# Patient Record
Sex: Female | Born: 1950
Health system: Southern US, Community
[De-identification: ages and names within clinical notes are randomized; demographics above are authoritative.]

## PROBLEM LIST (undated history)

## (undated) DIAGNOSIS — K219 Gastro-esophageal reflux disease without esophagitis: Secondary | ICD-10-CM

## (undated) DIAGNOSIS — M8580 Other specified disorders of bone density and structure, unspecified site: Secondary | ICD-10-CM

## (undated) DIAGNOSIS — I10 Essential (primary) hypertension: Secondary | ICD-10-CM

## (undated) DIAGNOSIS — E785 Hyperlipidemia, unspecified: Secondary | ICD-10-CM

## (undated) DIAGNOSIS — N841 Polyp of cervix uteri: Secondary | ICD-10-CM

## (undated) DIAGNOSIS — M1710 Unilateral primary osteoarthritis, unspecified knee: Secondary | ICD-10-CM

## (undated) HISTORY — DX: Gastro-esophageal reflux disease without esophagitis: K21.9

## (undated) HISTORY — PX: BREAST CYST ASPIRATION: SHX578

## (undated) HISTORY — DX: Unilateral primary osteoarthritis, unspecified knee: M17.10

## (undated) HISTORY — DX: Other specified disorders of bone density and structure, unspecified site: M85.80

## (undated) HISTORY — DX: Hyperlipidemia, unspecified: E78.5

## (undated) HISTORY — DX: Essential (primary) hypertension: I10

## (undated) HISTORY — DX: Polyp of cervix uteri: N84.1

## (undated) HISTORY — PX: APPENDECTOMY: SHX54

---

## 1986-06-23 ENCOUNTER — Encounter: Payer: Self-pay | Admitting: Gastroenterology

## 1998-06-02 ENCOUNTER — Other Ambulatory Visit: Admission: RE | Admit: 1998-06-02 | Discharge: 1998-06-02 | Payer: Self-pay | Admitting: Obstetrics and Gynecology

## 1998-06-22 ENCOUNTER — Ambulatory Visit (HOSPITAL_COMMUNITY): Admission: RE | Admit: 1998-06-22 | Discharge: 1998-06-22 | Payer: Self-pay | Admitting: Obstetrics and Gynecology

## 1998-06-22 ENCOUNTER — Encounter: Payer: Self-pay | Admitting: Obstetrics and Gynecology

## 1999-06-02 ENCOUNTER — Other Ambulatory Visit: Admission: RE | Admit: 1999-06-02 | Discharge: 1999-06-02 | Payer: Self-pay | Admitting: Obstetrics and Gynecology

## 1999-06-15 ENCOUNTER — Ambulatory Visit (HOSPITAL_COMMUNITY): Admission: RE | Admit: 1999-06-15 | Discharge: 1999-06-15 | Payer: Self-pay | Admitting: Obstetrics and Gynecology

## 1999-06-15 ENCOUNTER — Encounter: Payer: Self-pay | Admitting: Obstetrics and Gynecology

## 2000-07-22 ENCOUNTER — Ambulatory Visit (HOSPITAL_COMMUNITY): Admission: RE | Admit: 2000-07-22 | Discharge: 2000-07-22 | Payer: Self-pay | Admitting: Obstetrics and Gynecology

## 2000-07-22 ENCOUNTER — Encounter: Payer: Self-pay | Admitting: Obstetrics and Gynecology

## 2000-08-21 ENCOUNTER — Other Ambulatory Visit: Admission: RE | Admit: 2000-08-21 | Discharge: 2000-08-21 | Payer: Self-pay | Admitting: Obstetrics and Gynecology

## 2001-09-08 ENCOUNTER — Other Ambulatory Visit: Admission: RE | Admit: 2001-09-08 | Discharge: 2001-09-08 | Payer: Self-pay | Admitting: Obstetrics and Gynecology

## 2001-09-15 ENCOUNTER — Encounter: Payer: Self-pay | Admitting: Obstetrics and Gynecology

## 2001-09-15 ENCOUNTER — Ambulatory Visit (HOSPITAL_COMMUNITY): Admission: RE | Admit: 2001-09-15 | Discharge: 2001-09-15 | Payer: Self-pay | Admitting: Obstetrics and Gynecology

## 2002-10-08 ENCOUNTER — Encounter: Payer: Self-pay | Admitting: Obstetrics and Gynecology

## 2002-10-08 ENCOUNTER — Ambulatory Visit (HOSPITAL_COMMUNITY): Admission: RE | Admit: 2002-10-08 | Discharge: 2002-10-08 | Payer: Self-pay | Admitting: Obstetrics and Gynecology

## 2002-10-30 ENCOUNTER — Other Ambulatory Visit: Admission: RE | Admit: 2002-10-30 | Discharge: 2002-10-30 | Payer: Self-pay | Admitting: *Deleted

## 2003-11-30 ENCOUNTER — Ambulatory Visit (HOSPITAL_COMMUNITY): Admission: RE | Admit: 2003-11-30 | Discharge: 2003-11-30 | Payer: Self-pay | Admitting: Family Medicine

## 2003-12-14 ENCOUNTER — Other Ambulatory Visit: Admission: RE | Admit: 2003-12-14 | Discharge: 2003-12-14 | Payer: Self-pay | Admitting: Obstetrics and Gynecology

## 2003-12-30 ENCOUNTER — Ambulatory Visit: Payer: Self-pay | Admitting: Family Medicine

## 2004-01-11 ENCOUNTER — Ambulatory Visit: Payer: Self-pay | Admitting: Family Medicine

## 2004-01-18 ENCOUNTER — Ambulatory Visit: Payer: Self-pay | Admitting: Family Medicine

## 2004-11-30 ENCOUNTER — Ambulatory Visit (HOSPITAL_COMMUNITY): Admission: RE | Admit: 2004-11-30 | Discharge: 2004-11-30 | Payer: Self-pay | Admitting: Obstetrics and Gynecology

## 2004-12-27 ENCOUNTER — Ambulatory Visit: Payer: Self-pay | Admitting: Family Medicine

## 2005-12-03 ENCOUNTER — Ambulatory Visit (HOSPITAL_COMMUNITY): Admission: RE | Admit: 2005-12-03 | Discharge: 2005-12-03 | Payer: Self-pay | Admitting: Family Medicine

## 2005-12-07 ENCOUNTER — Ambulatory Visit: Payer: Self-pay | Admitting: Family Medicine

## 2006-10-08 ENCOUNTER — Ambulatory Visit: Payer: Self-pay | Admitting: Family Medicine

## 2006-10-08 LAB — CONVERTED CEMR LAB
Basophils Relative: 0.6 % (ref 0.0–1.0)
Bilirubin Urine: NEGATIVE
CO2: 30 meq/L (ref 19–32)
Cholesterol: 194 mg/dL (ref 0–200)
Creatinine, Ser: 0.9 mg/dL (ref 0.4–1.2)
Glucose, Urine, Semiquant: NEGATIVE
HCT: 40.4 % (ref 36.0–46.0)
Hemoglobin: 13.6 g/dL (ref 12.0–15.0)
LDL Cholesterol: 102 mg/dL — ABNORMAL HIGH (ref 0–99)
MCHC: 33.7 g/dL (ref 30.0–36.0)
Monocytes Absolute: 0.5 10*3/uL (ref 0.2–0.7)
Neutrophils Relative %: 67.1 % (ref 43.0–77.0)
Potassium: 4.9 meq/L (ref 3.5–5.1)
RDW: 13 % (ref 11.5–14.6)
Sodium: 142 meq/L (ref 135–145)
Specific Gravity, Urine: 1.015
TSH: 1.52 microintl units/mL (ref 0.35–5.50)
Total Bilirubin: 1 mg/dL (ref 0.3–1.2)
Total Protein: 7.4 g/dL (ref 6.0–8.3)
Urobilinogen, UA: 0.2
VLDL: 15 mg/dL (ref 0–40)

## 2006-10-15 ENCOUNTER — Ambulatory Visit: Payer: Self-pay | Admitting: Family Medicine

## 2006-10-15 DIAGNOSIS — L259 Unspecified contact dermatitis, unspecified cause: Secondary | ICD-10-CM | POA: Insufficient documentation

## 2006-11-07 ENCOUNTER — Ambulatory Visit: Payer: Self-pay | Admitting: Gastroenterology

## 2006-11-19 ENCOUNTER — Encounter: Payer: Self-pay | Admitting: Family Medicine

## 2006-11-19 ENCOUNTER — Ambulatory Visit: Payer: Self-pay | Admitting: Gastroenterology

## 2006-11-26 ENCOUNTER — Ambulatory Visit: Payer: Self-pay | Admitting: Family Medicine

## 2006-12-14 ENCOUNTER — Encounter: Payer: Self-pay | Admitting: Family Medicine

## 2006-12-14 ENCOUNTER — Ambulatory Visit: Payer: Self-pay | Admitting: Family Medicine

## 2006-12-26 ENCOUNTER — Encounter: Admission: RE | Admit: 2006-12-26 | Discharge: 2006-12-26 | Payer: Self-pay | Admitting: Family Medicine

## 2007-11-25 ENCOUNTER — Ambulatory Visit: Payer: Self-pay | Admitting: Family Medicine

## 2007-12-25 ENCOUNTER — Ambulatory Visit: Payer: Self-pay | Admitting: Family Medicine

## 2007-12-25 LAB — CONVERTED CEMR LAB
ALT: 16 units/L (ref 0–35)
Albumin: 4 g/dL (ref 3.5–5.2)
BUN: 12 mg/dL (ref 6–23)
Basophils Absolute: 0.1 10*3/uL (ref 0.0–0.1)
Basophils Relative: 0.8 % (ref 0.0–3.0)
CO2: 30 meq/L (ref 19–32)
Calcium: 10 mg/dL (ref 8.4–10.5)
Creatinine, Ser: 0.8 mg/dL (ref 0.4–1.2)
Direct LDL: 105.2 mg/dL
Eosinophils Relative: 1.5 % (ref 0.0–5.0)
Glucose, Bld: 89 mg/dL (ref 70–99)
Glucose, Urine, Semiquant: NEGATIVE
Hemoglobin: 14.4 g/dL (ref 12.0–15.0)
Lymphocytes Relative: 22.4 % (ref 12.0–46.0)
MCHC: 33.6 g/dL (ref 30.0–36.0)
MCV: 88.5 fL (ref 78.0–100.0)
Neutro Abs: 4.8 10*3/uL (ref 1.4–7.7)
RBC: 4.84 M/uL (ref 3.87–5.11)
Specific Gravity, Urine: 1.01
TSH: 1.15 microintl units/mL (ref 0.35–5.50)
Total Bilirubin: 0.9 mg/dL (ref 0.3–1.2)
Total Protein: 7.8 g/dL (ref 6.0–8.3)
VLDL: 18 mg/dL (ref 0–40)
WBC Urine, dipstick: NEGATIVE
pH: 7

## 2007-12-31 ENCOUNTER — Encounter: Admission: RE | Admit: 2007-12-31 | Discharge: 2007-12-31 | Payer: Self-pay | Admitting: Family Medicine

## 2008-01-01 ENCOUNTER — Ambulatory Visit: Payer: Self-pay | Admitting: Family Medicine

## 2008-07-08 ENCOUNTER — Ambulatory Visit: Payer: Self-pay | Admitting: Family Medicine

## 2008-07-15 ENCOUNTER — Ambulatory Visit: Payer: Self-pay | Admitting: Family Medicine

## 2008-10-15 ENCOUNTER — Encounter: Admission: RE | Admit: 2008-10-15 | Discharge: 2008-10-15 | Payer: Self-pay | Admitting: Orthopaedic Surgery

## 2008-11-17 ENCOUNTER — Ambulatory Visit: Payer: Self-pay | Admitting: Family Medicine

## 2008-12-28 ENCOUNTER — Ambulatory Visit: Payer: Self-pay | Admitting: Family Medicine

## 2008-12-28 LAB — CONVERTED CEMR LAB
AST: 21 units/L (ref 0–37)
Albumin: 3.8 g/dL (ref 3.5–5.2)
Alkaline Phosphatase: 72 units/L (ref 39–117)
Basophils Absolute: 0 10*3/uL (ref 0.0–0.1)
Basophils Relative: 0.6 % (ref 0.0–3.0)
CO2: 27 meq/L (ref 19–32)
Eosinophils Absolute: 0 10*3/uL (ref 0.0–0.7)
Glucose, Bld: 82 mg/dL (ref 70–99)
HCT: 40.4 % (ref 36.0–46.0)
Hemoglobin: 13.7 g/dL (ref 12.0–15.0)
Lymphs Abs: 1.5 10*3/uL (ref 0.7–4.0)
MCHC: 33.9 g/dL (ref 30.0–36.0)
Monocytes Relative: 6 % (ref 3.0–12.0)
Neutro Abs: 5.4 10*3/uL (ref 1.4–7.7)
Nitrite: NEGATIVE
Potassium: 3.8 meq/L (ref 3.5–5.1)
RBC: 4.45 M/uL (ref 3.87–5.11)
RDW: 13.3 % (ref 11.5–14.6)
Sodium: 140 meq/L (ref 135–145)
Specific Gravity, Urine: <1.005
TSH: 1.17 microintl units/mL (ref 0.35–5.50)
Total CHOL/HDL Ratio: 3
Total Protein: 7.4 g/dL (ref 6.0–8.3)
Urobilinogen, UA: 0.2

## 2009-01-03 ENCOUNTER — Encounter: Admission: RE | Admit: 2009-01-03 | Discharge: 2009-01-03 | Payer: Self-pay | Admitting: Family Medicine

## 2009-01-04 ENCOUNTER — Other Ambulatory Visit: Admission: RE | Admit: 2009-01-04 | Discharge: 2009-01-04 | Payer: Self-pay | Admitting: Family Medicine

## 2009-01-04 ENCOUNTER — Ambulatory Visit: Payer: Self-pay | Admitting: Family Medicine

## 2009-01-04 DIAGNOSIS — R109 Unspecified abdominal pain: Secondary | ICD-10-CM | POA: Insufficient documentation

## 2009-01-05 ENCOUNTER — Encounter: Admission: RE | Admit: 2009-01-05 | Discharge: 2009-01-05 | Payer: Self-pay | Admitting: Family Medicine

## 2009-01-12 ENCOUNTER — Telehealth: Payer: Self-pay | Admitting: Family Medicine

## 2009-01-12 ENCOUNTER — Telehealth: Payer: Self-pay | Admitting: Gastroenterology

## 2009-01-12 ENCOUNTER — Encounter: Admission: RE | Admit: 2009-01-12 | Discharge: 2009-01-12 | Payer: Self-pay | Admitting: Family Medicine

## 2009-01-13 ENCOUNTER — Encounter (INDEPENDENT_AMBULATORY_CARE_PROVIDER_SITE_OTHER): Payer: Self-pay | Admitting: *Deleted

## 2009-01-17 ENCOUNTER — Ambulatory Visit: Payer: Self-pay | Admitting: Gastroenterology

## 2009-04-01 ENCOUNTER — Ambulatory Visit: Payer: Self-pay | Admitting: Family Medicine

## 2009-04-01 DIAGNOSIS — R059 Cough, unspecified: Secondary | ICD-10-CM | POA: Insufficient documentation

## 2009-04-01 DIAGNOSIS — R05 Cough: Secondary | ICD-10-CM

## 2009-04-28 ENCOUNTER — Ambulatory Visit: Payer: Self-pay | Admitting: Family Medicine

## 2009-04-28 DIAGNOSIS — J45909 Unspecified asthma, uncomplicated: Secondary | ICD-10-CM | POA: Insufficient documentation

## 2009-05-02 ENCOUNTER — Ambulatory Visit: Payer: Self-pay | Admitting: Family Medicine

## 2009-05-19 ENCOUNTER — Ambulatory Visit: Payer: Self-pay | Admitting: Family Medicine

## 2009-11-02 ENCOUNTER — Ambulatory Visit: Payer: Self-pay | Admitting: Family Medicine

## 2010-01-02 ENCOUNTER — Ambulatory Visit: Payer: Self-pay | Admitting: Family Medicine

## 2010-01-02 LAB — CONVERTED CEMR LAB
ALT: 32 units/L (ref 0–35)
Albumin: 3.9 g/dL (ref 3.5–5.2)
Basophils Relative: 0.4 % (ref 0.0–3.0)
Bilirubin Urine: NEGATIVE
Bilirubin, Direct: 0.1 mg/dL (ref 0.0–0.3)
CO2: 28 meq/L (ref 19–32)
Chloride: 104 meq/L (ref 96–112)
Cholesterol: 216 mg/dL — ABNORMAL HIGH (ref 0–200)
Creatinine, Ser: 0.9 mg/dL (ref 0.4–1.2)
Direct LDL: 117.8 mg/dL
Eosinophils Absolute: 0.1 10*3/uL (ref 0.0–0.7)
Eosinophils Relative: 1.3 % (ref 0.0–5.0)
HCT: 39.4 % (ref 36.0–46.0)
Hemoglobin: 13.4 g/dL (ref 12.0–15.0)
MCHC: 34.1 g/dL (ref 30.0–36.0)
MCV: 89.9 fL (ref 78.0–100.0)
Monocytes Absolute: 0.6 10*3/uL (ref 0.1–1.0)
Neutro Abs: 5.8 10*3/uL (ref 1.4–7.7)
Neutrophils Relative %: 66.3 % (ref 43.0–77.0)
Potassium: 4.3 meq/L (ref 3.5–5.1)
Protein, U semiquant: NEGATIVE
RBC: 4.38 M/uL (ref 3.87–5.11)
Sodium: 141 meq/L (ref 135–145)
Total CHOL/HDL Ratio: 3
Total Protein: 6.7 g/dL (ref 6.0–8.3)
Urobilinogen, UA: 0.2
VLDL: 21.8 mg/dL (ref 0.0–40.0)
WBC Urine, dipstick: NEGATIVE
WBC: 8.7 10*3/uL (ref 4.5–10.5)

## 2010-01-04 ENCOUNTER — Encounter: Admission: RE | Admit: 2010-01-04 | Discharge: 2010-01-04 | Payer: Self-pay | Admitting: Family Medicine

## 2010-01-04 LAB — HM MAMMOGRAPHY

## 2010-01-09 ENCOUNTER — Other Ambulatory Visit: Admission: RE | Admit: 2010-01-09 | Discharge: 2010-01-09 | Payer: Self-pay | Admitting: Family Medicine

## 2010-01-09 ENCOUNTER — Encounter: Payer: Self-pay | Admitting: Family Medicine

## 2010-01-09 ENCOUNTER — Ambulatory Visit: Payer: Self-pay | Admitting: Family Medicine

## 2010-01-09 LAB — HM PAP SMEAR

## 2010-03-16 NOTE — Assessment & Plan Note (Signed)
Summary: 3 wk rov/njr   Vital Signs:  Patient profile:   60 year old female Menstrual status:  postmenopausal Temp:     98.6 degrees F oral BP sitting:   140 / 96  (left arm) Cuff size:   regular  Vitals Entered By: Kern Reap CMA Duncan Dull) (May 19, 2009 4:49 PM) CC: follow-up visit   Primary Care Provider:  Tinnie Gens Dominion Kathan,MD  CC:  follow-up visit.  History of Present Illness: Colleen Coffey is a 60 year old female, who comes back today for follow-up of asthma.  She is been on one half tablet of prednisone daily x 7 days and her cough is pretty much gone.  No side effects from medication  Allergies: No Known Drug Allergies  Past History:  Past medical, surgical, family and social histories (including risk factors) reviewed for relevance to current acute and chronic problems.  Past Medical History: Reviewed history from 01/17/2009 and no changes required. Hemorrhoids Hx of constipation  Past Surgical History: Reviewed history from 01/01/2008 and no changes required. Childbirth x 2  C-sections BTL  Family History: Reviewed history from 01/17/2009 and no changes required. Family History High cholesterol Family History Hypertension Fam hx COPD No FH of Colon Cancer:  Social History: Reviewed history from 01/17/2009 and no changes required. Former Smoker Alcohol use-no Married Illicit Drug Use - no Daily Caffeine Use  rarely  Review of Systems      See HPI  Physical Exam  General:  Well-developed,well-nourished,in no acute distress; alert,appropriate and cooperative throughout examination Head:  Normocephalic and atraumatic without obvious abnormalities. No apparent alopecia or balding. Eyes:  No corneal or conjunctival inflammation noted. EOMI. Perrla. Funduscopic exam benign, without hemorrhages, exudates or papilledema. Vision grossly normal. Ears:  External ear exam shows no significant lesions or deformities.  Otoscopic examination reveals clear canals,  tympanic membranes are intact bilaterally without bulging, retraction, inflammation or discharge. Hearing is grossly normal bilaterally. Nose:  External nasal examination shows no deformity or inflammation. Nasal mucosa are pink and moist without lesions or exudates. Mouth:  Oral mucosa and oropharynx without lesions or exudates.  Teeth in good repair. Neck:  No deformities, masses, or tenderness noted. Chest Wall:  No deformities, masses, or tenderness noted. Lungs:  Normal respiratory effort, chest expands symmetrically. Lungs are clear to auscultation, no crackles or wheezes.   Impression & Recommendations:  Problem # 1:  ASTHMA (ICD-493.90) Assessment Improved  Her updated medication list for this problem includes:    Prednisone 20 Mg Tabs (Prednisone) ..... Uad  Complete Medication List: 1)  Hydrocodone-homatropine 5-1.5 Mg/46ml Syrp (Hydrocodone-homatropine) .... One tsp by mouth q 4-6 hours as needed cough 2)  Prednisone 20 Mg Tabs (Prednisone) .... Uad 3)  Hydromet 5-1.5 Mg/2ml Syrp (Hydrocodone-homatropine) .... 1/2 tsp at bedtime prn  Patient Instructions: 1)  take a half a tablet Friday skip Saturday and Sunday, then take a half a tablet Monday, Wednesday, Friday, for a 3-week taper and then stop.  Return p.r.n.

## 2010-03-16 NOTE — Assessment & Plan Note (Signed)
Summary: cough that will not go away/cjr   Vital Signs:  Patient profile:   60 year old female Menstrual status:  postmenopausal Weight:      178 pounds Temp:     98.9 degrees F oral BP sitting:   130 / 90  (left arm) Cuff size:   regular  Vitals Entered By: Kern Reap CMA Duncan Dull) (April 28, 2009 4:26 PM)  Reason for Visit cont. cough  Primary Care Provider:  Tinnie Gens Todd,MD   History of Present Illness: Colleen Coffey is a 84 -year-old female, nonsmoker, who comes in today for a cough x 3 months.  She saw Dr. Caryl Never a month or so ago for a cough.  At that time.  His exam was negative except for some slight expiratory wheezing.  He gave her a shot of Depo-Medrol, and some hydrocodone cough syrup.  The above medication did not help.  She continues to cough.  She has no fever, earache, sore throat, or sputum production.  No weight loss.  She states that she has no history of allergic rhinitis, nor asthma in the past.  Environmental review of systems negative.  She does have a dog, but has been with her for many years.  No history of reflux  Allergies: No Known Drug Allergies  Past History:  Past medical, surgical, family and social histories (including risk factors) reviewed for relevance to current acute and chronic problems.  Past Medical History: Reviewed history from 01/17/2009 and no changes required. Hemorrhoids Hx of constipation  Past Surgical History: Reviewed history from 01/01/2008 and no changes required. Childbirth x 2  C-sections BTL  Family History: Reviewed history from 01/17/2009 and no changes required. Family History High cholesterol Family History Hypertension Fam hx COPD No FH of Colon Cancer:  Social History: Reviewed history from 01/17/2009 and no changes required. Former Smoker Alcohol use-no Married Illicit Drug Use - no Daily Caffeine Use  rarely  Review of Systems      See HPI  Physical Exam  General:   Well-developed,well-nourished,in no acute distress; alert,appropriate and cooperative throughout examination Head:  Normocephalic and atraumatic without obvious abnormalities. No apparent alopecia or balding. Eyes:  No corneal or conjunctival inflammation noted. EOMI. Perrla. Funduscopic exam benign, without hemorrhages, exudates or papilledema. Vision grossly normal. Ears:  External ear exam shows no significant lesions or deformities.  Otoscopic examination reveals clear canals, tympanic membranes are intact bilaterally without bulging, retraction, inflammation or discharge. Hearing is grossly normal bilaterally. Nose:  External nasal examination shows no deformity or inflammation. Nasal mucosa are pink and moist without lesions or exudates. Mouth:  Oral mucosa and oropharynx without lesions or exudates.  Teeth in good repair. Neck:  No deformities, masses, or tenderness noted. Chest Wall:  No deformities, masses, or tenderness noted. Lungs:  symmetrical breath sounds bilateral expiratory mild wheezing   Impression & Recommendations:  Problem # 1:  ASTHMA (ICD-493.90) Assessment New  Orders: T-2 View CXR (71020TC) Prescription Created Electronically [phone])  Her updated medication list for this problem includes:    Prednisone 20 Mg Tabs (Prednisone) ..... Uad  Complete Medication List: 1)  Hydrocodone-homatropine 5-1.5 Mg/31ml Syrp (Hydrocodone-homatropine) .... One tsp by mouth q 4-6 hours as needed cough 2)  Prednisone 20 Mg Tabs (Prednisone) .... Uad 3)  Hydromet 5-1.5 Mg/60ml Syrp (Hydrocodone-homatropine) .... 1/2 tsp at bedtime prn  Patient Instructions: 1)  go to the main office for a chest x-ray. 2)  Begin prednisone one tablet x 5 days, one half tablet x 5 days,  then one half tablet Monday, Wednesday, Friday, for a 3-week taper. 3)  Take Prilosec 20 mg once daily in the morning.  Nothing to eat or drink for 3 hours before bedtime to avoid all caffeine and peppermint.  Return  in 3 weeks for follow-up Prescriptions: PREDNISONE 20 MG TABS (PREDNISONE) UAD  #30 x 1   Entered and Authorized by:   Roderick Pee MD   Signed by:   Roderick Pee MD on 04/28/2009   Method used:   Electronically to        [facility]  [phone]* (retail)       [address] [place], [place]  [address]       Ph: [phone] or [phone]       Fax: [phone]   RxID:   [id number] HYDROMET 5-1.5 MG/5ML SYRP (HYDROCODONE-HOMATROPINE) 1/2 tsp at bedtime prn  #8oz x 1   Entered and Authorized by:   Roderick Pee MD   Signed by:   Roderick Pee MD on 04/28/2009   Method used:   Print then Give to Patient   RxID:   [id number] PREDNISONE 20 MG TABS (PREDNISONE) UAD  #30 x 1   Entered and Authorized by:   Roderick Pee MD   Signed by:   Roderick Pee MD on 04/28/2009   Method used:   Electronically to        [facility]  [phone]* (retail)       [address]       [place], [place]  [address]       Ph: [phone] or [phone]       Fax: [phone]   RxID:   [id number]

## 2010-03-16 NOTE — Assessment & Plan Note (Signed)
Summary: FLU SHOT // RS   Nurse Visit   Allergies: No Known Drug Allergies  Orders Added: 1)  Admin 1st Vaccine [90471] 2)  Flu Vaccine 3yrs + [90658] Flu Vaccine Consent Questions     Do you have a history of severe allergic reactions to this vaccine? no    Any prior history of allergic reactions to egg and/or gelatin? no    Do you have a sensitivity to the preservative Thimersol? no    Do you have a past history of Guillan-Barre Syndrome? no    Do you currently have an acute febrile illness? no    Have you ever had a severe reaction to latex? no    Vaccine information given and explained to patient? yes    Are you currently pregnant? no    Lot Number:[id number]   Exp Date:08/12/2010   Site Given  Left Deltoid IM .lbflu 

## 2010-03-16 NOTE — Assessment & Plan Note (Signed)
Summary: dry cough for about 2 months/cjr   Vital Signs:  Patient profile:   60 year old female Menstrual status:  postmenopausal Temp:     98.6 degrees F oral BP sitting:   110 / 82  (left arm) Cuff size:   large  Vitals Entered By: Sid Falcon LPN (April 01, 2009 3:50 PM) CC: Cough X 2 months, tickle   History of Present Illness: Acute visit. 2 month history of dry cough possibly worse at night.  Patient is a nonsmoker. Has tried Robitussin and throat lozenges without improvement. Denies any dyspnea, appetite changes, weight changes, hemoptysis, pleuritic pain, GERD symptoms, regular medication use, sinusitis symptoms, allergic postnasal drip symptoms.  No history of asthma. Has noticed some faint nighttime wheezing. No environmental changes. Had the same dog for 7 years.  Preventive Screening-Counseling & Management  Alcohol-Tobacco     Smoking Status: never  Allergies (verified): No Known Drug Allergies  Past History:  Past Medical History: Last updated: 01/17/2009 Hemorrhoids Hx of constipation  Social History: Last updated: 01/17/2009 Former Smoker Alcohol use-no Married Illicit Drug Use - no Daily Caffeine Use  rarely PMH reviewed for relevance, PSH reviewed for relevance  Social History: Smoking Status:  never  Review of Systems      See HPI  Physical Exam  General:  Well-developed,well-nourished,in no acute distress; alert,appropriate and cooperative throughout examination Ears:  External ear exam shows no significant lesions or deformities.  Otoscopic examination reveals clear canals, tympanic membranes are intact bilaterally without bulging, retraction, inflammation or discharge. Hearing is grossly normal bilaterally. Nose:  External nasal examination shows no deformity or inflammation. Nasal mucosa are pink and moist without lesions or exudates. Mouth:  Oral mucosa and oropharynx without lesions or exudates.  Teeth in good repair. Neck:  No  deformities, masses, or tenderness noted. Lungs:  patient has a few very faint end expiratory wheezes. No rales. Symmetric breath sounds. Heart:  Normal rate and regular rhythm. S1 and S2 normal without gallop, murmur, click, rub or other extra sounds. Extremities:  no edema   Impression & Recommendations:  Problem # 1:  COUGH, CHRONIC (ICD-786.2)  Differential reviewed. Suspect post viral with mild reactive airway component. Depo-Medrol 80 mg given. Pro-air or inhaler for p.r.n. use. Cough syrup as needed. Consider chest x-ray if no better in the next week  Orders: Depo- Medrol 80mg  (J1040) Admin of Therapeutic Inj  intramuscular or subcutaneous (16109)  Complete Medication List: 1)  Hydrocodone-homatropine 5-1.5 Mg/98ml Syrp (Hydrocodone-homatropine) .... One tsp by mouth q 4-6 hours as needed cough  Patient Instructions: 1)  May try pro-air inhaler every 4 hours as needed for cough or wheeze. 2)   use cough medication as needed. 3)  touch base with Dr. Tawanna Cooler next week if cough not improving. Prescriptions: HYDROCODONE-HOMATROPINE 5-1.5 MG/5ML SYRP (HYDROCODONE-HOMATROPINE) one tsp by mouth q 4-6 hours as needed cough  #120 ml x 0   Entered and Authorized by:   Evelena Peat MD   Signed by:   Evelena Peat MD on 04/01/2009   Method used:   Print then Give to Patient   RxID:   [id number]    Medication Administration  Injection # 1:    Medication: Depo- Medrol 80mg     Diagnosis: COUGH, CHRONIC (ICD-786.2)    Route: IM    Site: LUOQ gluteus    Exp Date: 12/14/2011    Lot #: [id number]    Mfr: Pharmacia    Patient tolerated injection without complications    Given  by: Sid Falcon LPN (April 01, 2009 4:27 PM)  Orders Added: 1)  Est. Patient Level III [04540] 2)  Depo- Medrol 80mg  [J1040] 3)  Admin of Therapeutic Inj  intramuscular or subcutaneous [98119]

## 2010-03-16 NOTE — Assessment & Plan Note (Signed)
Summary: CPX // RS   Vital Signs:  Patient profile:   60 year old female Menstrual status:  postmenopausal Height:      60.25 inches Weight:      179 pounds BMI:     34.79 Temp:     98.4 degrees F oral Pulse rate:   84 / minute Pulse rhythm:   regular BP sitting:   122 / 74  (left arm) Cuff size:   large  Vitals Entered By: Alfred Levins, CMA (January 09, 2010 3:00 PM)  Primary Care Provider:  Tinnie Gens Todd,MD   History of Present Illness: Colleen Coffey is a 60 year old female, married, nonsmoker, who comes in today for her annual physical examination  She's always been in excellent, health.  She's had no chronic health problems.  She takes no medication on a regular basis except for an anti-inflammatory Mobic 15 mg daily the orthopedist for some osteoarthritis.  She gets routine eye care, dental care, BSE monthly, annual mammography, colonoscopy, 2005, normal, that is 2006, season flu shot 2011  Review of systems negative except for no energy, x 6 months.  She's had a lot of psychic trauma.  She resigned from work.  Her husband has metastatic prostate cancer, and she is also caring for her sick mother.  She declines any counseling  Current Medications (verified): 1)  Meloxicam 15 Mg Tabs (Meloxicam) .Marland Kitchen.. 1 By Mouth Once Daily 2)  Fish Oil   Oil (Fish Oil) .... Once Daily 3)  Multivitamins   Tabs (Multiple Vitamin) .... Once Daily 4)  Vitamin D 1000 Unit  Tabs (Cholecalciferol) .Marland Kitchen.. 1 By Mouth Once Daily 5)  Citrucel   Powd (Methylcellulose (Laxative)) .... Once Daily 6)  Tylenol 325 Mg Tabs (Acetaminophen) .... As Needed 7)  Maqui 6 .... Once Daily For Weight Loss Otc  Allergies (verified): No Known Drug Allergies  Past History:  Past medical, surgical, family and social histories (including risk factors) reviewed, and no changes noted (except as noted below).  Past Medical History: Reviewed history from 01/17/2009 and no changes required. Hemorrhoids Hx of  constipation  Past Surgical History: Reviewed history from 01/01/2008 and no changes required. Childbirth x 2  C-sections BTL  Family History: Reviewed history from 01/17/2009 and no changes required. Family History High cholesterol Family History Hypertension Fam hx COPD No FH of Colon Cancer:  Social History: Reviewed history from 01/17/2009 and no changes required. Former Smoker Alcohol use-no Married Illicit Drug Use - no Daily Caffeine Use  rarely  Review of Systems      See HPI  Physical Exam  General:  Well-developed,well-nourished,in no acute distress; alert,appropriate and cooperative throughout examination Head:  Normocephalic and atraumatic without obvious abnormalities. No apparent alopecia or balding. Eyes:  No corneal or conjunctival inflammation noted. EOMI. Perrla. Funduscopic exam benign, without hemorrhages, exudates or papilledema. Vision grossly normal. Ears:  External ear exam shows no significant lesions or deformities.  Otoscopic examination reveals clear canals, tympanic membranes are intact bilaterally without bulging, retraction, inflammation or discharge. Hearing is grossly normal bilaterally. Nose:  External nasal examination shows no deformity or inflammation. Nasal mucosa are pink and moist without lesions or exudates. Mouth:  Oral mucosa and oropharynx without lesions or exudates.  Teeth in good repair. Neck:  No deformities, masses, or tenderness noted. Chest Wall:  No deformities, masses, or tenderness noted. Breasts:  No mass, nodules, thickening, tenderness, bulging, retraction, inflamation, nipple discharge or skin changes noted.   Lungs:  Normal respiratory effort, chest expands symmetrically. Lungs  are clear to auscultation, no crackles or wheezes. Heart:  Normal rate and regular rhythm. S1 and S2 normal without gallop, murmur, click, rub or other extra sounds. Abdomen:  Bowel sounds positive,abdomen soft and non-tender without masses,  organomegaly or hernias noted. Rectal:  No external abnormalities noted. Normal sphincter tone. No rectal masses or tenderness. Genitalia:  Pelvic Exam:        External: normal female genitalia without lesions or masses        Vagina: normal without lesions or masses        Cervix: normal without lesions or masses        Adnexa: normal bimanual exam without masses or fullness..........Marland Kitchena small 4 mm cervical polyp nonbleeding        Uterus: normal by palpation        Pap smear: performed Msk:  No deformity or scoliosis noted of thoracic or lumbar spine.   Pulses:  R and L carotid,radial,femoral,dorsalis pedis and posterior tibial pulses are full and equal bilaterally Extremities:  No clubbing, cyanosis, edema, or deformity noted with normal full range of motion of all joints.   Neurologic:  No cranial nerve deficits noted. Station and gait are normal. Plantar reflexes are down-going bilaterally. DTRs are symmetrical throughout. Sensory, motor and coordinative functions appear intact. Skin:  Intact without suspicious lesions or rashes Cervical Nodes:  No lymphadenopathy noted Axillary Nodes:  No palpable lymphadenopathy Inguinal Nodes:  No significant adenopathy Psych:  Cognition and judgment appear intact. Alert and cooperative with normal attention span and concentration. No apparent delusions, illusions, hallucinations   Impression & Recommendations:  Problem # 1:  HEALTH SCREENING (ICD-V70.0) Assessment Unchanged  Complete Medication List: 1)  Meloxicam 15 Mg Tabs (Meloxicam) .Marland Kitchen.. 1 by mouth once daily 2)  Fish Oil Oil (Fish oil) .... Once daily 3)  Multivitamins Tabs (Multiple vitamin) .... Once daily 4)  Vitamin D 1000 Unit Tabs (Cholecalciferol) .Marland Kitchen.. 1 by mouth once daily 5)  Citrucel Powd (Methylcellulose (laxative)) .... Once daily 6)  Tylenol 325 Mg Tabs (Acetaminophen) .... As needed 7)  Maqui 6  .... Once daily for weight loss otc  Patient Instructions: 1)  Please  schedule a follow-up appointment in 1 year. 2)  It is important that you exercise regularly at least 20 minutes 5 times a week. If you develop chest pain, have severe difficulty breathing, or feel very tired , stop exercising immediately and seek medical attention. 3)  Schedule your mammogram. 4)  Schedule a colonoscopy/sigmoidoscopy to help detect colon cancer. 5)  Take calcium +Vitamin D daily. 6)  Take an Aspirin every day.   Orders Added: 1)  Est. Patient 40-64 years [99396]

## 2010-06-21 IMAGING — MG MM DIGITAL DIAG LTD R
2 series · 2 of 2 positions shown · non-contrast
Comparison: 12/03/2005, 12/26/2006, 12/31/2007

CLINICAL DATA: The patient returns for evaluation of a possible
mass in the right breast noted on recent screening study dated
01/03/2009.

DIGITAL DIAGNOSTIC  RIGHT LIMITED  MAMMOGRAM   AND RIGHT BREAST
ULTRASOUND:

[R CC]
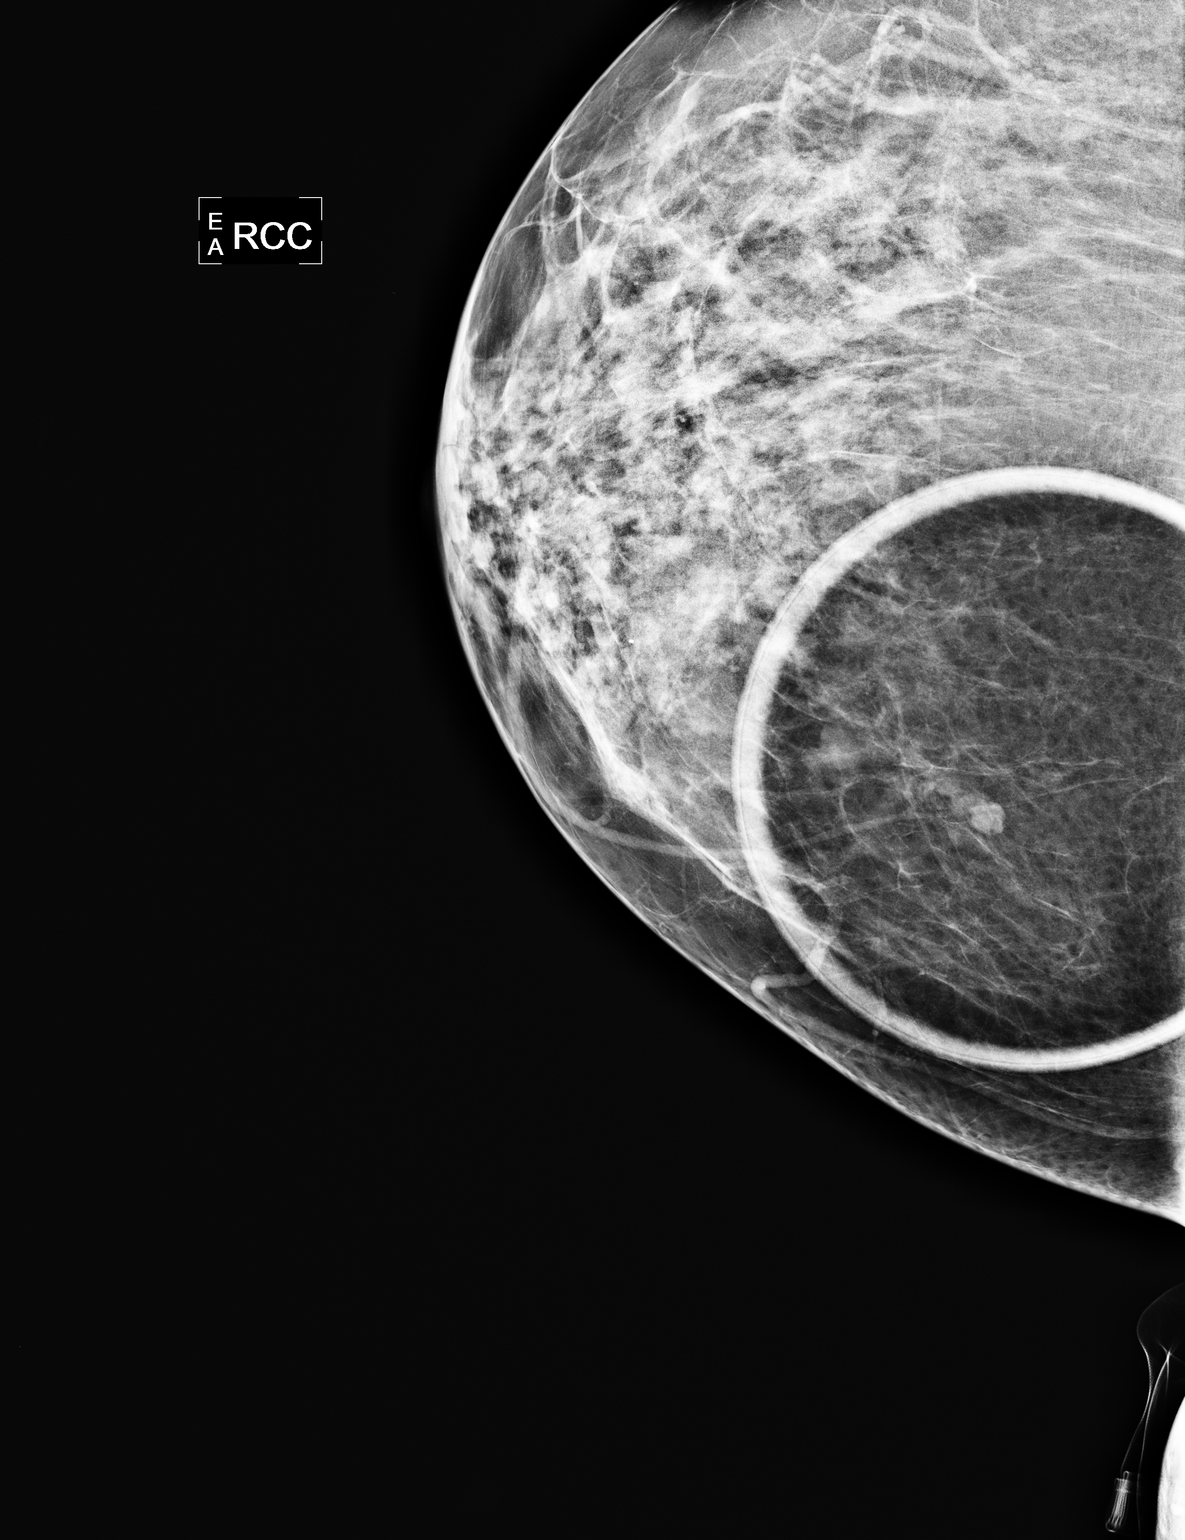

[R MLO]
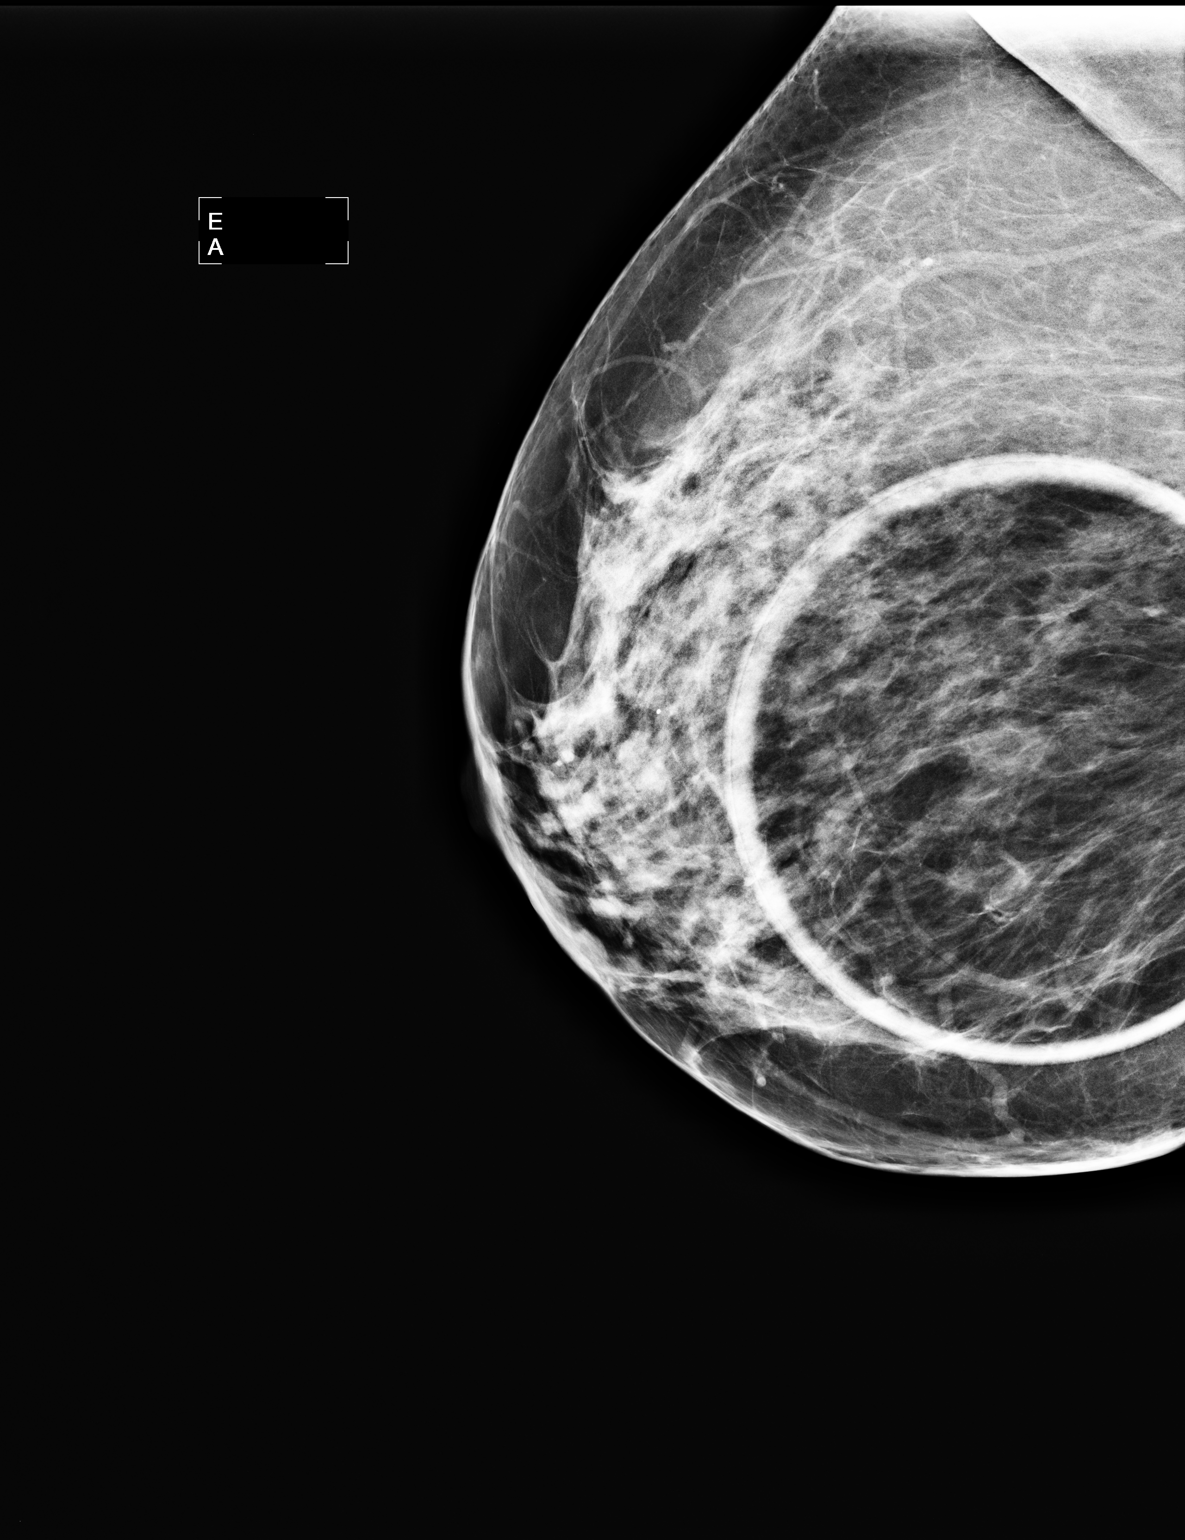

[2 of 2 positions shown; findings below may reference images not displayed]

FINDINGS: Additional views confirm the presence of a round nodule
in the right upper inner quadrant posteriorly.

On physical exam, no mass is palpated in the right upper inner
quadrant.

Ultrasound is performed, showing a cyst at 2 o'clock 10 cm from the
right nipple measuring 4 mm.
IMPRESSION: No mammographic or sonographic evidence of malignancy.  The
mammographic nodule corresponds to a cyst.  Yearly screening
mammography is suggested.

BI-RADS CATEGORY 2:  Benign finding(s).

## 2010-11-14 ENCOUNTER — Other Ambulatory Visit: Payer: Self-pay | Admitting: Family Medicine

## 2010-11-14 DIAGNOSIS — Z1231 Encounter for screening mammogram for malignant neoplasm of breast: Secondary | ICD-10-CM

## 2010-11-15 ENCOUNTER — Ambulatory Visit (INDEPENDENT_AMBULATORY_CARE_PROVIDER_SITE_OTHER): Payer: BC Managed Care – PPO

## 2010-11-15 DIAGNOSIS — Z23 Encounter for immunization: Secondary | ICD-10-CM

## 2010-12-28 ENCOUNTER — Other Ambulatory Visit (INDEPENDENT_AMBULATORY_CARE_PROVIDER_SITE_OTHER): Payer: BC Managed Care – PPO

## 2010-12-28 DIAGNOSIS — Z0000 Encounter for general adult medical examination without abnormal findings: Secondary | ICD-10-CM

## 2010-12-28 LAB — POCT URINALYSIS DIPSTICK
Blood, UA: NEGATIVE
Nitrite, UA: NEGATIVE
Protein, UA: NEGATIVE
Urobilinogen, UA: 0.2
pH, UA: 7

## 2010-12-28 LAB — CBC WITH DIFFERENTIAL/PLATELET
Eosinophils Absolute: 0.3 10*3/uL (ref 0.0–0.7)
Lymphocytes Relative: 27.9 % (ref 12.0–46.0)
MCHC: 33.3 g/dL (ref 30.0–36.0)
MCV: 91.4 fl (ref 78.0–100.0)
Monocytes Absolute: 0.6 10*3/uL (ref 0.1–1.0)
Neutrophils Relative %: 60.4 % (ref 43.0–77.0)
Platelets: 381 10*3/uL (ref 150.0–400.0)
WBC: 8.3 10*3/uL (ref 4.5–10.5)

## 2010-12-29 LAB — BASIC METABOLIC PANEL
BUN: 14 mg/dL (ref 6–23)
CO2: 27 mEq/L (ref 19–32)
Calcium: 9.4 mg/dL (ref 8.4–10.5)
Chloride: 105 mEq/L (ref 96–112)
Creatinine, Ser: 0.8 mg/dL (ref 0.4–1.2)

## 2010-12-29 LAB — HEPATIC FUNCTION PANEL
Bilirubin, Direct: 0 mg/dL (ref 0.0–0.3)
Total Bilirubin: 0.4 mg/dL (ref 0.3–1.2)

## 2010-12-29 LAB — LIPID PANEL
HDL: 68.1 mg/dL (ref >39.00)
LDL Cholesterol: 105 mg/dL — ABNORMAL HIGH (ref 0–99)
Total CHOL/HDL Ratio: 3
Triglycerides: 88 mg/dL (ref 0.0–149.0)
VLDL: 17.6 mg/dL (ref 0.0–40.0)

## 2011-01-08 ENCOUNTER — Ambulatory Visit
Admission: RE | Admit: 2011-01-08 | Discharge: 2011-01-08 | Disposition: A | Discharge status: Home / Self Care | Payer: BC Managed Care – PPO | Source: Ambulatory Visit | Attending: Family Medicine | Admitting: Family Medicine

## 2011-01-08 DIAGNOSIS — Z1231 Encounter for screening mammogram for malignant neoplasm of breast: Secondary | ICD-10-CM

## 2011-01-10 ENCOUNTER — Encounter: Payer: Self-pay | Admitting: Family Medicine

## 2011-01-11 ENCOUNTER — Encounter: Payer: Self-pay | Admitting: Family Medicine

## 2011-01-15 ENCOUNTER — Ambulatory Visit (INDEPENDENT_AMBULATORY_CARE_PROVIDER_SITE_OTHER): Payer: BC Managed Care – PPO | Admitting: Family Medicine

## 2011-01-15 ENCOUNTER — Encounter: Payer: Self-pay | Admitting: Family Medicine

## 2011-01-15 ENCOUNTER — Other Ambulatory Visit (HOSPITAL_COMMUNITY)
Admission: RE | Admit: 2011-01-15 | Discharge: 2011-01-15 | Disposition: A | Discharge status: Home / Self Care | Payer: BC Managed Care – PPO | Source: Ambulatory Visit | Attending: Family Medicine | Admitting: Family Medicine

## 2011-01-15 DIAGNOSIS — Z01419 Encounter for gynecological examination (general) (routine) without abnormal findings: Secondary | ICD-10-CM | POA: Insufficient documentation

## 2011-01-15 DIAGNOSIS — Z0000 Encounter for general adult medical examination without abnormal findings: Secondary | ICD-10-CM

## 2011-01-15 NOTE — Patient Instructions (Signed)
Follow up in one year or sooner if any problems. 

## 2011-01-15 NOTE — Progress Notes (Signed)
  Subjective:    Patient ID: Colleen Coffey, female    DOB: [birth year], 60 y.o.   MRN: [id number]  HPI J.  is a delightful, 60 year old Married female, nonsmoker, G2, P2 postmenopausal, who comes in today for general physical examination  She is always been excellent, health.  She's had no chronic health problems except for her back pain.  She is currently taking an oral anti-inflammatory 50 mg b.i.d. And seen an orthopedist and has had some epidural steroid injections.  Tetanus visit 2010, seasonal flu 2012, information given on shingles.  She gets routine eye care, dental care, BSE monthly, annual mammography, recent colonoscopy, normal.   Review of Systems  Constitutional: Negative.   HENT: Negative.   Eyes: Negative.   Respiratory: Negative.   Cardiovascular: Negative.   Gastrointestinal: Negative.   Genitourinary: Negative.   Musculoskeletal: Positive for back pain.  Neurological: Negative.   Hematological: Negative.   Psychiatric/Behavioral: Negative.        Objective:   Physical Exam  Constitutional: She appears well-developed and well-nourished.  HENT:  Head: Normocephalic and atraumatic.  Right Ear: External ear normal.  Left Ear: External ear normal.  Nose: Nose normal.  Mouth/Throat: Oropharynx is clear and moist.  Eyes: EOM are normal. Pupils are equal, round, and reactive to light.  Neck: Normal range of motion. Neck supple. No thyromegaly present.  Cardiovascular: Normal rate, regular rhythm, normal heart sounds and intact distal pulses.  Exam reveals no gallop and no friction rub.   No murmur heard. Pulmonary/Chest: Effort normal and breath sounds normal.  Abdominal: Soft. Bowel sounds are normal. She exhibits no distension and no mass. There is no tenderness. There is no rebound.  Genitourinary: Vagina normal and uterus normal. Guaiac negative stool. No vaginal discharge found.       Bilateral breast exam normal, cervical polyp asymptomatic previously  present.  No change.  No bleeding  Musculoskeletal: Normal range of motion.  Lymphadenopathy:    She has no cervical adenopathy.  Neurological: She is alert. She has normal reflexes. No cranial nerve deficit. She exhibits normal muscle tone. Coordination normal.  Skin: Skin is warm and dry.  Psychiatric: She has a normal mood and affect. Her behavior is normal. Judgment and thought content normal.          Assessment & Plan:  Healthy female.  Degenerative disk disease.  Continue follow-up with orthopedist.  Recommend neurosurgical consult if surgery is anticipated.  Follow-up in one year, sooner if any problems

## 2011-11-27 ENCOUNTER — Other Ambulatory Visit: Payer: Self-pay | Admitting: Family Medicine

## 2011-11-27 DIAGNOSIS — Z1231 Encounter for screening mammogram for malignant neoplasm of breast: Secondary | ICD-10-CM

## 2012-01-08 ENCOUNTER — Other Ambulatory Visit (INDEPENDENT_AMBULATORY_CARE_PROVIDER_SITE_OTHER): Payer: BC Managed Care – PPO

## 2012-01-08 DIAGNOSIS — Z0000 Encounter for general adult medical examination without abnormal findings: Secondary | ICD-10-CM

## 2012-01-08 LAB — CBC WITH DIFFERENTIAL/PLATELET
Basophils Relative: 0.3 % (ref 0.0–3.0)
Eosinophils Relative: 3.2 % (ref 0.0–5.0)
HCT: 39.8 % (ref 36.0–46.0)
Lymphs Abs: 2.3 10*3/uL (ref 0.7–4.0)
MCV: 88.9 fl (ref 78.0–100.0)
Monocytes Absolute: 0.6 10*3/uL (ref 0.1–1.0)
Monocytes Relative: 7.8 % (ref 3.0–12.0)
Neutrophils Relative %: 59.3 % (ref 43.0–77.0)
RBC: 4.48 Mil/uL (ref 3.87–5.11)
WBC: 8 10*3/uL (ref 4.5–10.5)

## 2012-01-08 LAB — BASIC METABOLIC PANEL
Chloride: 104 mEq/L (ref 96–112)
GFR: 92 mL/min (ref >60.00)
Potassium: 4.6 mEq/L (ref 3.5–5.1)

## 2012-01-08 LAB — LIPID PANEL
Cholesterol: 192 mg/dL (ref 0–200)
LDL Cholesterol: 110 mg/dL — ABNORMAL HIGH (ref 0–99)
Total CHOL/HDL Ratio: 3
VLDL: 20 mg/dL (ref 0.0–40.0)

## 2012-01-08 LAB — POCT URINALYSIS DIPSTICK
Blood, UA: NEGATIVE
Ketones, UA: NEGATIVE
Protein, UA: NEGATIVE
Spec Grav, UA: 1.01
Urobilinogen, UA: 0.2

## 2012-01-08 LAB — HEPATIC FUNCTION PANEL
ALT: 20 U/L (ref 0–35)
AST: 20 U/L (ref 0–37)
Bilirubin, Direct: 0 mg/dL (ref 0.0–0.3)
Total Bilirubin: 0.5 mg/dL (ref 0.3–1.2)
Total Protein: 7.1 g/dL (ref 6.0–8.3)

## 2012-01-14 ENCOUNTER — Ambulatory Visit
Admission: RE | Admit: 2012-01-14 | Discharge: 2012-01-14 | Disposition: A | Discharge status: Home / Self Care | Payer: BC Managed Care – PPO | Source: Ambulatory Visit | Attending: Family Medicine | Admitting: Family Medicine

## 2012-01-14 DIAGNOSIS — Z1231 Encounter for screening mammogram for malignant neoplasm of breast: Secondary | ICD-10-CM

## 2012-01-16 ENCOUNTER — Ambulatory Visit (INDEPENDENT_AMBULATORY_CARE_PROVIDER_SITE_OTHER): Payer: BC Managed Care – PPO | Admitting: Family Medicine

## 2012-01-16 ENCOUNTER — Encounter: Payer: Self-pay | Admitting: Family Medicine

## 2012-01-16 ENCOUNTER — Other Ambulatory Visit (HOSPITAL_COMMUNITY)
Admission: RE | Admit: 2012-01-16 | Discharge: 2012-01-16 | Disposition: A | Discharge status: Home / Self Care | Payer: BC Managed Care – PPO | Source: Ambulatory Visit | Attending: Family Medicine | Admitting: Family Medicine

## 2012-01-16 VITALS — BP 120/80 | Temp 98.2°F | Ht 60.25 in | Wt 171.0 lb

## 2012-01-16 DIAGNOSIS — Z01419 Encounter for gynecological examination (general) (routine) without abnormal findings: Secondary | ICD-10-CM | POA: Insufficient documentation

## 2012-01-16 DIAGNOSIS — Z0000 Encounter for general adult medical examination without abnormal findings: Secondary | ICD-10-CM

## 2012-01-16 DIAGNOSIS — M25559 Pain in unspecified hip: Secondary | ICD-10-CM

## 2012-01-16 NOTE — Patient Instructions (Signed)
Do your exercises for your back on a daily basis  Return in one year for general physical examination sooner if any problems

## 2012-01-16 NOTE — Progress Notes (Signed)
  Subjective:    Patient ID: Colleen Coffey, female    DOB: 10-18-1950, 61 y.o.   MRN: [id number]  HPI  [name] is a 61 year old married female nonsmoker retired who comes in today for general physical examination  She's always been in excellent health she has no chronic health problems except for degenerative disease lumbar spine. We sent her to see Dr. Cleophas Dunker. They did a complete evaluation and she was told she had a bulging disc and arthritis. She's currently on an anti-inflammatory and tramadol when necessary  She is a member of the [facility] [employer] [facility] [employer] however she's not exercising on a regular basis. Advised to do daily exercises.  She gets routine eye care, dental care, BSE monthly, and you mammography, colonoscopy screening normal, tetanus 2010, seasonal flu shot 2013, information given on shingles  Review of Systems  Constitutional: Negative.   HENT: Negative.   Eyes: Negative.   Respiratory: Negative.   Cardiovascular: Negative.   Gastrointestinal: Negative.   Genitourinary: Negative.   Musculoskeletal: Negative.   Neurological: Negative.   Hematological: Negative.   Psychiatric/Behavioral: Negative.        Objective:   Physical Exam  Constitutional: She appears well-developed and well-nourished.  HENT:  Head: Normocephalic and atraumatic.  Right Ear: External ear normal.  Left Ear: External ear normal.  Nose: Nose normal.  Mouth/Throat: Oropharynx is clear and moist.  Eyes: EOM are normal. Pupils are equal, round, and reactive to light.  Neck: Normal range of motion. Neck supple. No thyromegaly present.  Cardiovascular: Normal rate, regular rhythm, normal heart sounds and intact distal pulses.  Exam reveals no gallop and no friction rub.   No murmur heard. Pulmonary/Chest: Effort normal and breath sounds normal.  Abdominal: Soft. Bowel sounds are normal. She exhibits no distension and no mass. There is no tenderness. There is no rebound.  Genitourinary: Vagina normal and uterus  normal. Guaiac negative stool. No vaginal discharge found.       Bilateral breast exam normal  8 mm in diameter endocervical polyp unchanged since last year not bleeding  Musculoskeletal: Normal range of motion.  Lymphadenopathy:    She has no cervical adenopathy.  Neurological: She is alert. She has normal reflexes. No cranial nerve deficit. She exhibits normal muscle tone. Coordination normal.  Skin: Skin is warm and dry.  Psychiatric: She has a normal mood and affect. Her behavior is normal. Judgment and thought content normal.          Assessment & Plan:

## 2012-02-13 HISTORY — PX: BACK SURGERY: SHX140

## 2012-05-29 ENCOUNTER — Ambulatory Visit: Payer: BC Managed Care – PPO | Admitting: Family Medicine

## 2012-12-10 ENCOUNTER — Other Ambulatory Visit: Payer: Self-pay

## 2012-12-10 DIAGNOSIS — Z1231 Encounter for screening mammogram for malignant neoplasm of breast: Secondary | ICD-10-CM

## 2013-01-15 ENCOUNTER — Ambulatory Visit
Admission: RE | Admit: 2013-01-15 | Discharge: 2013-01-15 | Disposition: A | Discharge status: Home / Self Care | Payer: BC Managed Care – PPO | Source: Ambulatory Visit

## 2013-01-15 DIAGNOSIS — Z1231 Encounter for screening mammogram for malignant neoplasm of breast: Secondary | ICD-10-CM

## 2013-01-22 ENCOUNTER — Encounter: Payer: Self-pay | Admitting: Podiatrist

## 2013-01-22 ENCOUNTER — Other Ambulatory Visit (INDEPENDENT_AMBULATORY_CARE_PROVIDER_SITE_OTHER): Payer: BC Managed Care – PPO

## 2013-01-22 ENCOUNTER — Ambulatory Visit (INDEPENDENT_AMBULATORY_CARE_PROVIDER_SITE_OTHER): Payer: BC Managed Care – PPO | Admitting: Podiatrist

## 2013-01-22 VITALS — BP 145/75 | HR 94 | Resp 16

## 2013-01-22 DIAGNOSIS — M722 Plantar fascial fibromatosis: Secondary | ICD-10-CM

## 2013-01-22 DIAGNOSIS — M7150 Other bursitis, not elsewhere classified, unspecified site: Secondary | ICD-10-CM

## 2013-01-22 DIAGNOSIS — Z0000 Encounter for general adult medical examination without abnormal findings: Secondary | ICD-10-CM

## 2013-01-22 LAB — LIPID PANEL
Cholesterol: 188 mg/dL (ref 0–200)
HDL: 75.8 mg/dL (ref >39.00)
LDL Cholesterol: 98 mg/dL (ref 0–99)
Total CHOL/HDL Ratio: 2
Triglycerides: 73 mg/dL (ref 0.0–149.0)
VLDL: 14.6 mg/dL (ref 0.0–40.0)

## 2013-01-22 LAB — HEPATIC FUNCTION PANEL
ALT: 19 U/L (ref 0–35)
AST: 19 U/L (ref 0–37)
Albumin: 4.1 g/dL (ref 3.5–5.2)
Alkaline Phosphatase: 64 U/L (ref 39–117)
Total Bilirubin: 0.6 mg/dL (ref 0.3–1.2)
Total Protein: 7.5 g/dL (ref 6.0–8.3)

## 2013-01-22 LAB — BASIC METABOLIC PANEL
BUN: 12 mg/dL (ref 6–23)
CO2: 29 mEq/L (ref 19–32)
Calcium: 9.4 mg/dL (ref 8.4–10.5)
GFR: 94.37 mL/min (ref >60.00)
Glucose, Bld: 88 mg/dL (ref 70–99)
Potassium: 4.2 mEq/L (ref 3.5–5.1)
Sodium: 138 mEq/L (ref 135–145)

## 2013-01-22 LAB — CBC WITH DIFFERENTIAL/PLATELET
Basophils Absolute: 0 10*3/uL (ref 0.0–0.1)
Eosinophils Relative: 1.3 % (ref 0.0–5.0)
HCT: 40.1 % (ref 36.0–46.0)
Hemoglobin: 13.4 g/dL (ref 12.0–15.0)
Lymphocytes Relative: 24.5 % (ref 12.0–46.0)
MCHC: 33.4 g/dL (ref 30.0–36.0)
MCV: 87.1 fl (ref 78.0–100.0)
Monocytes Absolute: 0.5 10*3/uL (ref 0.1–1.0)
Monocytes Relative: 7.1 % (ref 3.0–12.0)
Neutrophils Relative %: 66.6 % (ref 43.0–77.0)
Platelets: 360 10*3/uL (ref 150.0–400.0)
RDW: 14.8 % — ABNORMAL HIGH (ref 11.5–14.6)
WBC: 6.8 10*3/uL (ref 4.5–10.5)

## 2013-01-22 LAB — POCT URINALYSIS DIPSTICK
Blood, UA: NEGATIVE
Ketones, UA: NEGATIVE
Nitrite, UA: NEGATIVE
Protein, UA: NEGATIVE
Spec Grav, UA: 1.015
Urobilinogen, UA: 0.2
pH, UA: 7

## 2013-01-22 LAB — TSH: TSH: 1.44 u[IU]/mL (ref 0.35–5.50)

## 2013-01-22 MED ORDER — MELOXICAM 15 MG PO TABS: 15.0000 mg | ORAL_TABLET | Freq: Every day | ORAL | Status: DC

## 2013-01-22 MED ORDER — TRIAMCINOLONE ACETONIDE 40 MG/ML IJ SUSP
20.0000 mg | Freq: Once | INTRAMUSCULAR | Status: AC
Start: 2013-01-22 — End: 2013-01-22
  Administered 2013-01-22: 20 mg

## 2013-01-22 NOTE — Progress Notes (Signed)
  Subjective: Patient presents today for heel pain on the right foot. She states the pain never really improved from the last visit. Chart notes are reviewed from her last visit and she was diagnosed with metatarsalgia. No heel pain was discussed at the last visit. She was however dispensed some power step inserts and injection therapy however she never returned for her scheduled followup visit. Now she is complaining about heel pain. She states it hurts in the morning and continues throughout the day.  Objective: Neurovascular status is unchanged. No pain along the metatarsal region bilaterally is noted. No forefoot pain is seen. No neuroma-type symptomatology is noted. Diffuse plantar fasciitis symptomatology is present with pain along the plantar medial aspect of the right heel. No pain with medial to lateral squeeze or compression of the right calcaneus. No pain with tuning fork or tapping along the tarsal canal.  Assessment: Plantar fasciitis, pes planus  Plan: Recommended an injection in the right heel and this was carried out at today's visit with a Kenalog and Marcaine mixture under sterile conditions. She was instructed on use of a night splint which was dispensed for her use. A prescription for meloxicam was also written for her use. She will continue to use the power step inserts. I will see her back for a recheck and reevaluation. May need to consider custom orthotic devices if the symptoms do not improve with the injection. Removable fascial bracing was also dispensed.

## 2013-01-22 NOTE — Patient Instructions (Signed)

## 2013-01-29 ENCOUNTER — Encounter: Payer: BC Managed Care – PPO | Admitting: Family

## 2013-02-02 ENCOUNTER — Other Ambulatory Visit: Payer: BC Managed Care – PPO

## 2013-02-03 ENCOUNTER — Ambulatory Visit (INDEPENDENT_AMBULATORY_CARE_PROVIDER_SITE_OTHER): Payer: BC Managed Care – PPO | Admitting: Family

## 2013-02-03 ENCOUNTER — Encounter: Payer: Self-pay | Admitting: Family

## 2013-02-03 VITALS — BP 146/90 | HR 73 | Ht 60.75 in | Wt 176.0 lb

## 2013-02-03 DIAGNOSIS — Z0000 Encounter for general adult medical examination without abnormal findings: Secondary | ICD-10-CM

## 2013-02-03 DIAGNOSIS — M1990 Unspecified osteoarthritis, unspecified site: Secondary | ICD-10-CM

## 2013-02-03 NOTE — Progress Notes (Signed)
Pre visit review using our clinic review tool, if applicable. No additional management support is needed unless otherwise documented below in the visit note. 

## 2013-02-03 NOTE — Patient Instructions (Signed)
Exercise to Stay Healthy Exercise helps you become and stay healthy. EXERCISE IDEAS AND TIPS Choose exercises that:  You enjoy.  Fit into your day. You do not need to exercise really hard to be healthy. You can do exercises at a slow or medium level and stay healthy. You can:  Stretch before and after working out.  Try yoga, Pilates, or tai chi.  Lift weights.  Walk fast, swim, jog, run, climb stairs, bicycle, dance, or rollerskate.  Take aerobic classes. Exercises that burn about 150 calories:  Running 1  miles in 15 minutes.  Playing volleyball for 45 to 60 minutes.  Washing and waxing a car for 45 to 60 minutes.  Playing touch football for 45 minutes.  Walking 1  miles in 35 minutes.  Pushing a stroller 1  miles in 30 minutes.  Playing basketball for 30 minutes.  Raking leaves for 30 minutes.  Bicycling 5 miles in 30 minutes.  Walking 2 miles in 30 minutes.  Dancing for 30 minutes.  Shoveling snow for 15 minutes.  Swimming laps for 20 minutes.  Walking up stairs for 15 minutes.  Bicycling 4 miles in 15 minutes.  Gardening for 30 to 45 minutes.  Jumping rope for 15 minutes.  Washing windows or floors for 45 to 60 minutes. Document Released: 03/03/2010 Document Revised: 04/23/2011 Document Reviewed: 03/03/2010 [employer] Patient Information 2014 [employer].  

## 2013-02-03 NOTE — Progress Notes (Signed)
Subjective:    Patient ID: Colleen Coffey, female    DOB: 1950/04/02, 62 y.o.   MRN: [id number]  HPI  62 year old is in for a routine physical examination for this healthy  Female. Reviewed all health maintenance protocols including mammography colonoscopy bone density and reviewed appropriate screening labs. Her immunization history was reviewed as well as her current medications and allergies refills of her chronic medications were given and the plan for yearly health maintenance was discussed all orders and referrals were made as appropriate.  Review of Systems  Constitutional: Negative.   HENT: Negative.   Eyes: Negative.   Respiratory: Negative.   Cardiovascular: Negative.   Gastrointestinal: Negative.   Endocrine: Negative.   Genitourinary: Negative.   Musculoskeletal: Negative.   Skin: Negative.   Allergic/Immunologic: Negative.   Neurological: Negative.   Hematological: Negative.   Psychiatric/Behavioral: Negative.    Past Medical History  Diagnosis Date  . Hemorrhoids   . Constipation   . Endocervical polyp     History   Social History  . Marital Status: Married    Spouse Name: N/A    Number of Children: N/A  . Years of Education: N/A   Occupational History  . Not on file.   Social History Main Topics  . Smoking status: Former Games developer  . Smokeless tobacco: Not on file  . Alcohol Use: No  . Drug Use: No  . Sexual Activity: Not on file   Other Topics Concern  . Not on file   Social History Narrative  . No narrative on file    Past Surgical History  Procedure Laterality Date  . Cesarean section      2  . Back surgery      Family History  Problem Relation Age of Onset  . Hyperlipidemia Other   . Hypertension Other   . Depression Other   . Cancer Other     colon    No Known Allergies  Current Outpatient Prescriptions on File Prior to Visit  Medication Sig Dispense Refill  . meloxicam (MOBIC) 15 MG tablet Take 1 tablet (15 mg total) by  mouth daily.  30 tablet  2  . acetaminophen (TYLENOL) 325 MG tablet Take 650 mg by mouth every 6 (six) hours as needed.         No current facility-administered medications on file prior to visit.    BP 146/90  Pulse 73  Ht 5' 0.75" (1.543 m)  Wt 176 lb (79.833 kg)  BMI 33.53 kg/m2chart    Objective:   Physical Exam  Constitutional: She is oriented to person, place, and time. She appears well-developed and well-nourished.  HENT:  Head: Normocephalic and atraumatic.  Right Ear: External ear normal.  Left Ear: External ear normal.  Nose: Nose normal.  Mouth/Throat: Oropharynx is clear and moist.  Eyes: Conjunctivae and EOM are normal. Pupils are equal, round, and reactive to light.  Neck: Normal range of motion. Neck supple. No thyromegaly present.  Cardiovascular: Normal rate, regular rhythm and normal heart sounds.   Pulmonary/Chest: Effort normal and breath sounds normal.  Abdominal: Soft. Bowel sounds are normal. She exhibits no distension. There is no tenderness. There is no rebound.  Musculoskeletal: Normal range of motion. She exhibits no edema and no tenderness.  Neurological: She is alert and oriented to person, place, and time. She has normal reflexes. She displays normal reflexes. No cranial nerve deficit. Coordination normal.  Skin: Skin is warm and dry.  Assessment & Plan:  Assessment: 1. CPX 2. Osteoarthritis   Plan: Continue current meds. Call the office with any questions or concerns. Refer to GI for colonoscopy. Exercise daily. Monthly self breast exams. Call the office with any questions or concerns.

## 2013-02-09 ENCOUNTER — Encounter: Payer: BC Managed Care – PPO | Admitting: Family Medicine

## 2013-11-06 ENCOUNTER — Other Ambulatory Visit: Payer: Self-pay

## 2013-11-06 DIAGNOSIS — Z1231 Encounter for screening mammogram for malignant neoplasm of breast: Secondary | ICD-10-CM

## 2014-01-20 ENCOUNTER — Ambulatory Visit
Admission: RE | Admit: 2014-01-20 | Discharge: 2014-01-20 | Disposition: A | Discharge status: Home / Self Care | Payer: BC Managed Care – PPO | Source: Ambulatory Visit

## 2014-01-20 DIAGNOSIS — Z1231 Encounter for screening mammogram for malignant neoplasm of breast: Secondary | ICD-10-CM

## 2014-02-01 ENCOUNTER — Other Ambulatory Visit (INDEPENDENT_AMBULATORY_CARE_PROVIDER_SITE_OTHER): Payer: BC Managed Care – PPO

## 2014-02-01 DIAGNOSIS — Z0000 Encounter for general adult medical examination without abnormal findings: Secondary | ICD-10-CM

## 2014-02-01 LAB — CBC WITH DIFFERENTIAL/PLATELET
Basophils Absolute: 0 10*3/uL (ref 0.0–0.1)
Basophils Relative: 0.5 % (ref 0.0–3.0)
Eosinophils Absolute: 0.1 10*3/uL (ref 0.0–0.7)
Eosinophils Relative: 2 % (ref 0.0–5.0)
HEMATOCRIT: 40.5 % (ref 36.0–46.0)
Hemoglobin: 13.1 g/dL (ref 12.0–15.0)
LYMPHS ABS: 2 10*3/uL (ref 0.7–4.0)
Lymphocytes Relative: 28.3 % (ref 12.0–46.0)
MCHC: 32.4 g/dL (ref 30.0–36.0)
MCV: 89.3 fl (ref 78.0–100.0)
MONO ABS: 0.4 10*3/uL (ref 0.1–1.0)
Monocytes Relative: 6.2 % (ref 3.0–12.0)
Neutro Abs: 4.3 10*3/uL (ref 1.4–7.7)
Neutrophils Relative %: 63 % (ref 43.0–77.0)
Platelets: 364 10*3/uL (ref 150.0–400.0)
RBC: 4.53 Mil/uL (ref 3.87–5.11)
RDW: 14.4 % (ref 11.5–15.5)
WBC: 6.9 10*3/uL (ref 4.0–10.5)

## 2014-02-01 LAB — POCT URINALYSIS DIPSTICK
Bilirubin, UA: NEGATIVE
Glucose, UA: NEGATIVE
Ketones, UA: NEGATIVE
Leukocytes, UA: NEGATIVE
Nitrite, UA: NEGATIVE
PH UA: 6
PROTEIN UA: NEGATIVE
RBC UA: NEGATIVE
Urobilinogen, UA: 0.2

## 2014-02-01 LAB — LIPID PANEL
Cholesterol: 211 mg/dL — ABNORMAL HIGH (ref 0–200)
HDL: 56.3 mg/dL (ref >39.00)
LDL CALC: 132 mg/dL — AB (ref 0–99)
NonHDL: 154.7
Total CHOL/HDL Ratio: 4
Triglycerides: 115 mg/dL (ref 0.0–149.0)
VLDL: 23 mg/dL (ref 0.0–40.0)

## 2014-02-01 LAB — COMPREHENSIVE METABOLIC PANEL
ALK PHOS: 64 U/L (ref 39–117)
ALT: 15 U/L (ref 0–35)
AST: 20 U/L (ref 0–37)
Albumin: 3.9 g/dL (ref 3.5–5.2)
BILIRUBIN TOTAL: 0.6 mg/dL (ref 0.2–1.2)
BUN: 17 mg/dL (ref 6–23)
CO2: 27 mEq/L (ref 19–32)
Calcium: 9.4 mg/dL (ref 8.4–10.5)
Chloride: 102 mEq/L (ref 96–112)
Creatinine, Ser: 0.8 mg/dL (ref 0.4–1.2)
GFR: 95.46 mL/min (ref >60.00)
GLUCOSE: 86 mg/dL (ref 70–99)
Potassium: 4.2 mEq/L (ref 3.5–5.1)
SODIUM: 136 meq/L (ref 135–145)
TOTAL PROTEIN: 7.1 g/dL (ref 6.0–8.3)

## 2014-02-02 LAB — TSH: TSH: 1.93 u[IU]/mL (ref 0.35–4.50)

## 2014-02-08 ENCOUNTER — Encounter: Payer: BC Managed Care – PPO | Admitting: Family Medicine

## 2014-02-11 ENCOUNTER — Encounter: Payer: BC Managed Care – PPO | Admitting: Family Medicine

## 2014-02-15 ENCOUNTER — Encounter: Payer: Self-pay | Admitting: Family Medicine

## 2014-02-15 ENCOUNTER — Ambulatory Visit (INDEPENDENT_AMBULATORY_CARE_PROVIDER_SITE_OTHER): Payer: BLUE CROSS/BLUE SHIELD | Admitting: Family Medicine

## 2014-02-15 VITALS — BP 130/80 | Temp 97.9°F | Ht 60.75 in | Wt 176.0 lb

## 2014-02-15 DIAGNOSIS — Z0000 Encounter for general adult medical examination without abnormal findings: Secondary | ICD-10-CM

## 2014-02-15 NOTE — Progress Notes (Signed)
Pre visit review using our clinic review tool, if applicable. No additional management support is needed unless otherwise documented below in the visit note. 

## 2014-02-15 NOTE — Patient Instructions (Signed)
The days that you did not do water aerobics to a 30 minute walk daily  Healthcare power of attorney and living well  Return in one year sooner if any problems  We are trying to call your insurance company about the issue of vaccinations

## 2014-02-15 NOTE — Progress Notes (Signed)
   Subjective:    Patient ID: Colleen Coffey, female    DOB: 1950/02/28, 64 y.o.   MRN: [id number]  Colleen Coffey is a 64 year old married female nonsmoker who comes in today for general physical examination  She's always been Health she's had no chronic health problems. She takes no medications for any chronic diseases.  She gets routine eye care, dental care, BSE monthly, annual mammography, colonoscopy 8 years ago was normal.  LMP around age 38 last Pap smear 2 years ago normal. She does water aerobics 4 days a week for general health and she had lumbar disc surgery years ago with a spinal fusion.  Family history mother and maternal grandmother both had Alzheimer's disease.  She tells me her insurance does not cover any vaccinations????????? we will check on that   Review of Systems  Constitutional: Negative.   HENT: Negative.   Eyes: Negative.   Respiratory: Negative.   Cardiovascular: Negative.   Gastrointestinal: Negative.   Endocrine: Negative.   Genitourinary: Negative.   Musculoskeletal: Negative.   Skin: Negative.   Allergic/Immunologic: Negative.   Neurological: Negative.   Hematological: Negative.   Psychiatric/Behavioral: Negative.        Objective:   Physical Exam  Constitutional: She appears well-developed and well-nourished.  HENT:  Head: Normocephalic and atraumatic.  Right Ear: External ear normal.  Left Ear: External ear normal.  Nose: Nose normal.  Mouth/Throat: Oropharynx is clear and moist.  Eyes: EOM are normal. Pupils are equal, round, and reactive to light.  Neck: Normal range of motion. Neck supple. No JVD present. No tracheal deviation present. No thyromegaly present.  Cardiovascular: Normal rate, regular rhythm, normal heart sounds and intact distal pulses.  Exam reveals no gallop and no friction rub.   No murmur heard. Pulmonary/Chest: Effort normal and breath sounds normal. No stridor. No respiratory distress. She has no wheezes. She has no  rales. She exhibits no tenderness.  Abdominal: Soft. Bowel sounds are normal. She exhibits no distension and no mass. There is no tenderness. There is no rebound and no guarding.  Genitourinary:  Bilateral breast exam normal  Musculoskeletal: Normal range of motion.  Lymphadenopathy:    She has no cervical adenopathy.  Neurological: She is alert. She has normal reflexes. No cranial nerve deficit. She exhibits normal muscle tone. Coordination normal.  Skin: Skin is warm and dry. No rash noted. No erythema. No pallor.  Total body skin exam normal except for scars to in her back and one in her abdomen from previous disc surgery. She says they went through her abdomen. Also scar in the pubic area she had C-sections 2  Psychiatric: She has a normal mood and affect. Her behavior is normal. Judgment and thought content normal.  Nursing note and vitals reviewed.         Assessment & Plan:  Healthy female  History of lumbar disc disease with spinal fusion....... doing well..... Continue exercise program

## 2014-02-19 ENCOUNTER — Ambulatory Visit (INDEPENDENT_AMBULATORY_CARE_PROVIDER_SITE_OTHER): Payer: BLUE CROSS/BLUE SHIELD | Admitting: *Deleted

## 2014-02-19 DIAGNOSIS — Z23 Encounter for immunization: Secondary | ICD-10-CM

## 2014-10-21 ENCOUNTER — Ambulatory Visit: Payer: PPO

## 2014-10-21 DIAGNOSIS — Z23 Encounter for immunization: Secondary | ICD-10-CM

## 2014-11-22 ENCOUNTER — Telehealth: Payer: Self-pay | Admitting: Family Medicine

## 2014-11-22 NOTE — Telephone Encounter (Signed)
She is not due for a physical until [facility] Jan anyways, so February would be fine with Dr. Sherren Mocha. I think I am fairly booked up as well - but if she has no medical issues and just wants to do physical/pap then can use my slot if she does not want to wait and I have something available sooner.

## 2014-11-22 NOTE — Telephone Encounter (Signed)
Pt has establish /transfer from dr todd with you in May. However pt would like to know if you would consider doing her annual wellness prior to then which is due in Jan. Dr Sherren Mocha is full until feb.

## 2014-12-01 ENCOUNTER — Other Ambulatory Visit: Payer: Self-pay

## 2014-12-01 DIAGNOSIS — Z1231 Encounter for screening mammogram for malignant neoplasm of breast: Secondary | ICD-10-CM

## 2014-12-16 ENCOUNTER — Ambulatory Visit (INDEPENDENT_AMBULATORY_CARE_PROVIDER_SITE_OTHER): Payer: PPO | Admitting: *Deleted

## 2014-12-16 DIAGNOSIS — Z23 Encounter for immunization: Secondary | ICD-10-CM | POA: Diagnosis not present

## 2015-01-17 LAB — HEMOGLOBIN A1C: Hemoglobin A1C: 5.8

## 2015-01-26 ENCOUNTER — Ambulatory Visit
Admission: RE | Admit: 2015-01-26 | Discharge: 2015-01-26 | Disposition: A | Discharge status: Home / Self Care | Payer: PPO | Source: Ambulatory Visit

## 2015-01-26 DIAGNOSIS — Z1231 Encounter for screening mammogram for malignant neoplasm of breast: Secondary | ICD-10-CM

## 2015-02-24 ENCOUNTER — Encounter: Payer: Self-pay | Admitting: Family Medicine

## 2015-03-03 DIAGNOSIS — M170 Bilateral primary osteoarthritis of knee: Secondary | ICD-10-CM | POA: Diagnosis not present

## 2015-06-27 ENCOUNTER — Encounter: Payer: Self-pay | Admitting: Family Medicine

## 2015-06-27 ENCOUNTER — Ambulatory Visit (INDEPENDENT_AMBULATORY_CARE_PROVIDER_SITE_OTHER): Payer: PPO | Admitting: Family Medicine

## 2015-06-27 VITALS — BP 128/72 | HR 89 | Temp 99.0°F | Ht 60.75 in | Wt 147.3 lb

## 2015-06-27 DIAGNOSIS — Z7189 Other specified counseling: Secondary | ICD-10-CM | POA: Diagnosis not present

## 2015-06-27 DIAGNOSIS — M1710 Unilateral primary osteoarthritis, unspecified knee: Secondary | ICD-10-CM

## 2015-06-27 DIAGNOSIS — Z6828 Body mass index (BMI) 28.0-28.9, adult: Secondary | ICD-10-CM

## 2015-06-27 DIAGNOSIS — Z7689 Persons encountering health services in other specified circumstances: Secondary | ICD-10-CM

## 2015-06-27 DIAGNOSIS — K5900 Constipation, unspecified: Secondary | ICD-10-CM

## 2015-06-27 DIAGNOSIS — M170 Bilateral primary osteoarthritis of knee: Secondary | ICD-10-CM

## 2015-06-27 DIAGNOSIS — M179 Osteoarthritis of knee, unspecified: Secondary | ICD-10-CM

## 2015-06-27 DIAGNOSIS — M8580 Other specified disorders of bone density and structure, unspecified site: Secondary | ICD-10-CM | POA: Diagnosis not present

## 2015-06-27 HISTORY — DX: Unilateral primary osteoarthritis, unspecified knee: M17.10

## 2015-06-27 HISTORY — DX: Osteoarthritis of knee, unspecified: M17.9

## 2015-06-27 HISTORY — DX: Other specified disorders of bone density and structure, unspecified site: M85.80

## 2015-06-27 NOTE — Patient Instructions (Addendum)
BEFORE YOU LEAVE: -physical with pap smear in 3 months; please come fasting for 8 hours if possible and we will plan to check labs - it is ok to have water and black coffee.  Vit D3 1000 IU daily. We sent a referral for the bone density testing. If someone has not contacted you about this appointment in 2 weeks, please call our office.  Tumeric can be helpful for inflammation and arthritis. Tylenol would be the best to take for pain if you need something, but do not use more then 3000mg  in one day.  Metamucil or Citrucel daily before breakfast for constipation. Miralax if you get stopped up.  We recommend the following healthy lifestyle measures: - eat a healthy whole foods diet consisting of regular small meals composed of vegetables, fruits, beans, nuts, seeds, healthy meats such as white chicken and fish and whole grains.  - avoid sweets, white starchy foods, fried foods, fast food, processed foods, sodas, red meet and other fattening foods.  - get a least 150-300 minutes of aerobic exercise per week.

## 2015-06-27 NOTE — Progress Notes (Signed)
Pre visit review using our clinic review tool, if applicable. No additional management support is needed unless otherwise documented below in the visit note. 

## 2015-06-27 NOTE — Progress Notes (Signed)
HPI:  Colleen Coffey is here to establish care. Used to see Dr. Sherren Mocha.  Has the following chronic problems that require follow up and concerns today:  Knee Osteoarthritis: -seeing orthopedic specialist for this -not taking anything   Overweight: -exercises 4-5 days per week - water aerobics and zumba; also lifts light weight -diet is healthy  Constipation: -chronic -tries to eat more fiber, does enema rarely  Osteopenic: -reports remotely on medication and then told did not need it any longer -has not had bone density testing in a long time -weight bearing exercise -no vit D  ROS negative for unless reported above: fevers, unintentional weight loss, hearing or vision loss, chest pain, palpitations, struggling to breath, hemoptysis, melena, hematochezia, hematuria, falls, loc, si, thoughts of self harm  Past Medical History  Diagnosis Date  . Hemorrhoids     resolved  . Constipation     chronic, improved with healthy diet  . Endocervical polyp   . Osteoarthritis, knee 06/27/2015    -saw [facility]   . Osteopenia 06/27/2015    -reports on medication remotely with gyn     Past Surgical History  Procedure Laterality Date  . Cesarean section      2  . Back surgery      Family History  Problem Relation Age of Onset  . Hyperlipidemia Other   . Hypertension Other   . Depression Other   . Cancer Other     colon    Social History   Social History  . Marital Status: Married    Spouse Name: N/A  . Number of Children: N/A  . Years of Education: N/A   Social History Main Topics  . Smoking status: Former Research scientist (life sciences)  . Smokeless tobacco: None  . Alcohol Use: No  . Drug Use: No  . Sexual Activity: Not Asked   Other Topics Concern  . None   Social History Narrative   Work or School: retired, used to be a Librarian, academic for [employer]      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; as a Engineer, materials; husband and mother passed in 2013      Spiritual Beliefs: Christian      Lifestyle: exercising; eating healthy          No current outpatient prescriptions on file.  EXAM:  Filed Vitals:   06/27/15 1124  BP: 128/72  Pulse: 89  Temp: 99 F (37.2 C)    Body mass index is 28.06 kg/(m^2).  GENERAL: vitals reviewed and listed above, alert, oriented, appears well hydrated and in no acute distress  HEENT: atraumatic, conjunttiva clear, no obvious abnormalities on inspection of external nose and ears  NECK: no obvious masses on inspection  LUNGS: clear to auscultation bilaterally, no wheezes, rales or rhonchi, good air movement  CV: HRRR, no peripheral edema  MS: moves all extremities without noticeable abnormality  PSYCH: pleasant and cooperative, no obvious depression or anxiety  ASSESSMENT AND PLAN:  Discussed the following assessment and plan:  Osteoarthritis of both knees, unspecified osteoarthritis type -reports continues to exercise despite -saw ortho and they put her on short course nsaids -advised tylenol if needed for pain, exercise that is easy on the knees, tumeric  Constipation, unspecified constipation type -daily fiber supplement and prn mirilax advised -follow up in change or worsens - she reports has had this her whole life  Osteopenia - Plan: DG Bone Density -advised to start vit D3  BMI 28.0-28.9,adult -lifestyle recs, congratulated on healthy  lifestyle  Establishing care with new doctor, encounter for -We reviewed the PMH, PSH, FH, SH, Meds and Allergies. -We provided refills for any medications we will prescribe as needed. -We addressed current concerns per orders and patient instructions. -We have asked for records for pertinent exams, studies, vaccines and notes from previous providers. -We have advised patient to follow up per instructions below.   -Patient advised to return or notify a doctor immediately if symptoms worsen or persist or new concerns arise.  Patient Instructions  BEFORE YOU LEAVE: -physical  with pap smear in 3 months; please come fasting for 8 hours if possible and we will plan to check labs - it is ok to have water and black coffee.  Vit D3 1000 IU daily. We sent a referral for the bone density testing. If someone has not contacted you about this appointment in 2 weeks, please call our office.  Tumeric can be helpful for inflammation and arthritis. Tylenol would be the best to take for pain if you need something, but do not use more then 3000mg  in one day.  Metamucil or Citrucel daily before breakfast for constipation. Miralax if you get stopped up.  We recommend the following healthy lifestyle measures: - eat a healthy whole foods diet consisting of regular small meals composed of vegetables, fruits, beans, nuts, seeds, healthy meats such as white chicken and fish and whole grains.  - avoid sweets, white starchy foods, fried foods, fast food, processed foods, sodas, red meet and other fattening foods.  - get a least 150-300 minutes of aerobic exercise per week.       Colin Benton R.

## 2015-07-19 ENCOUNTER — Ambulatory Visit
Admission: RE | Admit: 2015-07-19 | Discharge: 2015-07-19 | Disposition: A | Discharge status: Home / Self Care | Payer: PPO | Source: Ambulatory Visit | Attending: Family Medicine | Admitting: Family Medicine

## 2015-07-19 DIAGNOSIS — M8580 Other specified disorders of bone density and structure, unspecified site: Secondary | ICD-10-CM

## 2015-07-19 DIAGNOSIS — M8588 Other specified disorders of bone density and structure, other site: Secondary | ICD-10-CM | POA: Diagnosis not present

## 2015-07-19 DIAGNOSIS — Z780 Asymptomatic menopausal state: Secondary | ICD-10-CM | POA: Diagnosis not present

## 2015-09-21 NOTE — Progress Notes (Signed)
Medicare Annual Preventive Care Visit  (initial annual wellness or annual wellness exam)  Concerns and/or follow up today: none, exercising, eating healthy . Has lost almost 30 lbs with lifestyle changes and feels great. See scanned documentation.  ROS: negative for report of fevers, unintentional weight loss, vision changes, vision loss, hearing loss or change, chest pain, sob, hemoptysis, melena, hematochezia, hematuria, genital discharge or lesions, falls, bleeding or bruising, loc, thoughts of suicide or self harm, memory loss  1.) Patient-completed health risk assessment  - completed and reviewed, see scanned documentation  2.) Review of Medical History: -PMH, PSH, Family History and current specialty and care providers reviewed and updated and listed below  - see scanned in document in chart and below  Past Medical History:  Diagnosis Date  . Constipation    chronic, improved with healthy diet  . Endocervical polyp   . Hemorrhoids    resolved  . Osteoarthritis, knee 06/27/2015   -saw [facility]   . Osteopenia 06/27/2015   -reports on medication remotely with gyn     Past Surgical History:  Procedure Laterality Date  . BACK SURGERY    . CESAREAN SECTION     2    Social History   Social History  . Marital status: Married    Spouse name: N/A  . Number of children: N/A  . Years of education: N/A   Occupational History  . Not on file.   Social History Main Topics  . Smoking status: Former Research scientist (life sciences)  . Smokeless tobacco: Not on file  . Alcohol use No  . Drug use: No  . Sexual activity: Not on file   Other Topics Concern  . Not on file   Social History Narrative   Work or School: retired, used to be a Librarian, academic for [employer]      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; as a Engineer, materials; husband and mother passed in 2013      Spiritual Beliefs: Christian      Lifestyle: exercising; eating healthy          Family History  Problem Relation Age of Onset  .  Hyperlipidemia Other   . Hypertension Other   . Depression Other   . Cancer Other     colon    No current outpatient prescriptions on file prior to visit.   No current facility-administered medications on file prior to visit.      3.) Review of functional ability and level of safety:  Any difficulty hearing?  NO  History of falling?  NO  Any trouble with IADLs - using a phone, using transportation, grocery shopping, preparing meals, doing housework, doing laundry, taking medications and managing money? NO  Advance Directives? Yes  See summary of recommendations in Patient Instructions below.  4.) Physical Exam Vitals:   09/22/15 0720  BP: 120/80  Pulse: 74  Temp: 97.9 F (36.6 C)   Estimated body mass index is 28.99 kg/m as calculated from the following:   Height as of this encounter: 5' 0.5" (1.537 m).   Weight as of this encounter: 150 lb 14.4 oz (68.4 kg).  EKG (optional): deferred  Body mass index is 28.99 kg/m.  GENERAL: vitals reviewed and listed below, visual acuity grossly intact, alert, oriented, appears well hydrated and in no acute distress  HEENT: head atraumatic, PERRLA, normal appearance of eyes, ears, nose and mouth. moist mucus membranes.  NECK: supple, no masses or lymphadenopathy  LUNGS: clear to auscultation bilaterally, no rales,  rhonchi or wheeze  CV: HRRR, no peripheral edema or cyanosis, normal pedal pulses  BREAST: normal appearance - no lesions or discharge, on palpation normal breast tissue without any suspicious masses  ABDOMEN: bowel sounds normal, soft, non tender to palpation, no masses, no rebound or guarding  GU: normal appearance of external genitalia - no lesions or masses, normal vaginal mucosa - no abnormal discharge, normal appearance of cervix except for moderately sized polyp filling oz.  RECTAL: refused  SKIN: no rash or abnormal lesions  MS: normal gait, moves all extremities normally  NEURO: normal gait,  speech and thought processing grossly intact, muscle tone grossly intact throughout  PSYCH: normal affect, pleasant and cooperative  Cognitive function grossly intact  See patient instructions for recommendations.  Education and counseling regarding the above review of health provided with a plan for the following: -see scanned patient completed form for further details -fall prevention strategies discussed  -healthy lifestyle discussed -importance and resources for completing advanced directives discussed -see patient instructions below for any other recommendations provided  4)The following written screening schedule of preventive measures were reviewed with assessment and plan made per below, orders and patient instructions:         Alcohol screening done     Obesity Screening and counseling done     STI screening (Hep C if born [birth year]-65) offered and per pt wishes declined     Tobacco Screening done        Pneumococcal (PPSV23 -one dose after 82, one before if risk factors), influenza yearly and hepatitis B vaccines (if high risk - end stage renal disease, IV drugs, homosexual men, live in home for mentally retarded, hemophilia receiving factors) ASSESSMENT/PLAN: due later this year      Screening mammograph (yearly if >40) ASSESSMENT/PLAN: utd       Screening Pap smear/pelvic exam (q2 years) ASSESSMENT/PLAN: pelvic done, pap postponed due to cervical polyp - referred to gyn for removal and pap      Colorectal cancer screening (FOBT yearly or flex sig q4y or colonoscopy q10y or barium enema q4y) ASSESSMENT/PLAN: utd or ordered      Diabetes outpatient self-management training services ASSESSMENT/PLAN: utd or done      Bone mass measurements(covered q2y if indicated - estrogen def, osteoporosis, hyperparathyroid, vertebral abnormalities, osteoporosis or steroids) ASSESSMENT/PLAN: utd; she is taking vit and getting regular exercise      Screening for glaucoma(q1y if high  risk - diabetes, FH, AA and > 50 or hispanic and > 65) ASSESSMENT/PLAN: utd or advised      Medical nutritional therapy for individuals with diabetes or renal disease ASSESSMENT/PLAN: see orders      Cardiovascular screening blood tests (lipids q5y) ASSESSMENT/PLAN: see orders and labs      Diabetes screening tests ASSESSMENT/PLAN: see orders and labs   7.) Summary:   Encounter for preventive measure - Plan: Lipid Panel, Hemoglobin A1c -full physical done, referral to gyn for removal of cervical polyp and pap  Medicare annual wellness visit, initial -risk factors and conditions per above assessment were discussed and treatment, recommendations and referrals were offered per documentation above and orders and patient instructions.  Cervical polyp - Plan: Ambulatory referral to Gynecology  Osteopenia -cont current tx  BMI 28.0-28.9,adult -cont lifestyle  -congratulated on success -labs  Cervical cancer screening - Plan: Ambulatory referral to Gynecology  Patient Instructions  BEFORE YOU LEAVE: -follow up: yearly and as needed -labs  -We placed a referral for you as discussed to the  gynecologist for removal of the polyp and for your pap smear. It usually takes about 1-2 weeks to process and schedule this referral. If you have not heard from [place] regarding this appointment in 2 weeks please contact our office.  Continue vit d3 and regular exercise.  Follow up in 1-2 months for a flu shot.  We recommend the following healthy lifestyle: 1) Small portions - eat off of salad plate instead of dinner plate 2) Eat a healthy clean diet with avoidance of (less then 1 serving per week) processed foods, sweetened drinks, white starches, red meat, fast foods and sweets and consisting of: * 5-9 servings per day of fresh or frozen fruits and vegetables (not corn or potatoes, not dried or canned) *nuts and seeds, beans *olives and olive oil *small portions of lean meats such as fish and  white chicken  *small portions of whole grains 3)Get at least 150 minutes of sweaty aerobic exercise per week 4)reduce stress - counseling, meditation, relaxation to balance other aspects of your life  We have ordered labs or studies at this visit. It can take up to 1-2 weeks for results and processing. IF results require follow up or explanation, we will call you with instructions. Clinically stable results will be released to your [facility]. If you have not heard from [place] or cannot find your results in [facility] in 2 weeks please contact our office at [phone].  If you are not yet signed up for [facility], please consider signing up.            Colin Benton R., DO

## 2015-09-22 ENCOUNTER — Encounter: Payer: Self-pay | Admitting: Family Medicine

## 2015-09-22 ENCOUNTER — Ambulatory Visit (INDEPENDENT_AMBULATORY_CARE_PROVIDER_SITE_OTHER): Payer: PPO | Admitting: Family Medicine

## 2015-09-22 VITALS — BP 120/80 | HR 74 | Temp 97.9°F | Ht 60.5 in | Wt 150.9 lb

## 2015-09-22 DIAGNOSIS — Z0000 Encounter for general adult medical examination without abnormal findings: Secondary | ICD-10-CM | POA: Diagnosis not present

## 2015-09-22 DIAGNOSIS — Z124 Encounter for screening for malignant neoplasm of cervix: Secondary | ICD-10-CM

## 2015-09-22 DIAGNOSIS — M8580 Other specified disorders of bone density and structure, unspecified site: Secondary | ICD-10-CM

## 2015-09-22 DIAGNOSIS — N841 Polyp of cervix uteri: Secondary | ICD-10-CM

## 2015-09-22 DIAGNOSIS — Z299 Encounter for prophylactic measures, unspecified: Secondary | ICD-10-CM

## 2015-09-22 DIAGNOSIS — Z6828 Body mass index (BMI) 28.0-28.9, adult: Secondary | ICD-10-CM

## 2015-09-22 LAB — HEMOGLOBIN A1C: Hgb A1c MFr Bld: 5.3 % (ref 4.6–6.5)

## 2015-09-22 LAB — LIPID PANEL
CHOL/HDL RATIO: 3
Cholesterol: 211 mg/dL — ABNORMAL HIGH (ref 0–200)
HDL: 69.9 mg/dL (ref >39.00)
LDL Cholesterol: 121 mg/dL — ABNORMAL HIGH (ref 0–99)
NONHDL: 141.25
TRIGLYCERIDES: 102 mg/dL (ref 0.0–149.0)
VLDL: 20.4 mg/dL (ref 0.0–40.0)

## 2015-09-22 NOTE — Progress Notes (Signed)
Pre visit review using our clinic review tool, if applicable. No additional management support is needed unless otherwise documented below in the visit note. 

## 2015-09-22 NOTE — Patient Instructions (Signed)
BEFORE YOU LEAVE: -follow up: yearly and as needed -labs  -We placed a referral for you as discussed to the gynecologist for removal of the polyp and for your pap smear. It usually takes about 1-2 weeks to process and schedule this referral. If you have not heard from [place] regarding this appointment in 2 weeks please contact our office.  Continue vit d3 and regular exercise.  Follow up in 1-2 months for a flu shot.  We recommend the following healthy lifestyle: 1) Small portions - eat off of salad plate instead of dinner plate 2) Eat a healthy clean diet with avoidance of (less then 1 serving per week) processed foods, sweetened drinks, white starches, red meat, fast foods and sweets and consisting of: * 5-9 servings per day of fresh or frozen fruits and vegetables (not corn or potatoes, not dried or canned) *nuts and seeds, beans *olives and olive oil *small portions of lean meats such as fish and white chicken  *small portions of whole grains 3)Get at least 150 minutes of sweaty aerobic exercise per week 4)reduce stress - counseling, meditation, relaxation to balance other aspects of your life  We have ordered labs or studies at this visit. It can take up to 1-2 weeks for results and processing. IF results require follow up or explanation, we will call you with instructions. Clinically stable results will be released to your [facility]. If you have not heard from [place] or cannot find your results in [facility] in 2 weeks please contact our office at [phone].  If you are not yet signed up for [facility], please consider signing up.

## 2015-10-05 ENCOUNTER — Encounter: Payer: Self-pay | Admitting: Obstetrics and Gynecology

## 2015-10-27 ENCOUNTER — Ambulatory Visit (INDEPENDENT_AMBULATORY_CARE_PROVIDER_SITE_OTHER): Payer: PPO

## 2015-10-27 DIAGNOSIS — Z23 Encounter for immunization: Secondary | ICD-10-CM | POA: Diagnosis not present

## 2015-10-31 ENCOUNTER — Other Ambulatory Visit (HOSPITAL_COMMUNITY)
Admission: RE | Admit: 2015-10-31 | Discharge: 2015-10-31 | Disposition: A | Discharge status: Home / Self Care | Payer: PPO | Source: Ambulatory Visit | Attending: Obstetrics and Gynecology | Admitting: Obstetrics and Gynecology

## 2015-10-31 ENCOUNTER — Ambulatory Visit (INDEPENDENT_AMBULATORY_CARE_PROVIDER_SITE_OTHER): Payer: PPO | Admitting: Obstetrics and Gynecology

## 2015-10-31 ENCOUNTER — Encounter: Payer: Self-pay | Admitting: Obstetrics and Gynecology

## 2015-10-31 VITALS — BP 159/74 | HR 77 | Ht 60.0 in | Wt 154.4 lb

## 2015-10-31 DIAGNOSIS — N841 Polyp of cervix uteri: Secondary | ICD-10-CM | POA: Insufficient documentation

## 2015-10-31 NOTE — Progress Notes (Signed)
Pt was seen earlier last month for yearly GYN exam and noted to have a cervical polyp. She denies any GYN problems. Not sexual active. Last pap smear 4 years ago and was normal. Was told by previous PCP about a cervical polyp years ago.  Exam Pelvic  Nl EGBUS, cervix with visible polyp. Grasped with ring forceps and removed. Monsel's applied. Pt tolerated well. Informed consent obtained and time out performed.  A/P Cervical polyp  S/P removed  F/U per pathology report

## 2015-11-03 ENCOUNTER — Encounter: Payer: Self-pay | Admitting: General Practice

## 2015-11-08 DIAGNOSIS — M170 Bilateral primary osteoarthritis of knee: Secondary | ICD-10-CM | POA: Diagnosis not present

## 2015-11-08 DIAGNOSIS — M25561 Pain in right knee: Secondary | ICD-10-CM | POA: Diagnosis not present

## 2015-11-08 DIAGNOSIS — M25562 Pain in left knee: Secondary | ICD-10-CM | POA: Diagnosis not present

## 2015-12-14 DIAGNOSIS — M25561 Pain in right knee: Secondary | ICD-10-CM | POA: Diagnosis not present

## 2015-12-14 DIAGNOSIS — R262 Difficulty in walking, not elsewhere classified: Secondary | ICD-10-CM | POA: Diagnosis not present

## 2015-12-14 DIAGNOSIS — M25562 Pain in left knee: Secondary | ICD-10-CM | POA: Diagnosis not present

## 2015-12-14 DIAGNOSIS — M170 Bilateral primary osteoarthritis of knee: Secondary | ICD-10-CM | POA: Diagnosis not present

## 2015-12-16 ENCOUNTER — Other Ambulatory Visit: Payer: Self-pay | Admitting: Family Medicine

## 2015-12-16 DIAGNOSIS — Z1231 Encounter for screening mammogram for malignant neoplasm of breast: Secondary | ICD-10-CM

## 2015-12-21 DIAGNOSIS — M1711 Unilateral primary osteoarthritis, right knee: Secondary | ICD-10-CM | POA: Diagnosis not present

## 2015-12-21 DIAGNOSIS — M25561 Pain in right knee: Secondary | ICD-10-CM | POA: Diagnosis not present

## 2015-12-22 DIAGNOSIS — M1712 Unilateral primary osteoarthritis, left knee: Secondary | ICD-10-CM | POA: Diagnosis not present

## 2015-12-22 DIAGNOSIS — M25562 Pain in left knee: Secondary | ICD-10-CM | POA: Diagnosis not present

## 2015-12-28 DIAGNOSIS — M25561 Pain in right knee: Secondary | ICD-10-CM | POA: Diagnosis not present

## 2015-12-28 DIAGNOSIS — M1711 Unilateral primary osteoarthritis, right knee: Secondary | ICD-10-CM | POA: Diagnosis not present

## 2015-12-29 DIAGNOSIS — M25562 Pain in left knee: Secondary | ICD-10-CM | POA: Diagnosis not present

## 2015-12-29 DIAGNOSIS — M1712 Unilateral primary osteoarthritis, left knee: Secondary | ICD-10-CM | POA: Diagnosis not present

## 2016-01-03 DIAGNOSIS — M1711 Unilateral primary osteoarthritis, right knee: Secondary | ICD-10-CM | POA: Diagnosis not present

## 2016-01-03 DIAGNOSIS — M25561 Pain in right knee: Secondary | ICD-10-CM | POA: Diagnosis not present

## 2016-01-04 DIAGNOSIS — M1712 Unilateral primary osteoarthritis, left knee: Secondary | ICD-10-CM | POA: Diagnosis not present

## 2016-01-04 DIAGNOSIS — M25562 Pain in left knee: Secondary | ICD-10-CM | POA: Diagnosis not present

## 2016-01-12 DIAGNOSIS — M170 Bilateral primary osteoarthritis of knee: Secondary | ICD-10-CM | POA: Diagnosis not present

## 2016-01-12 DIAGNOSIS — M25561 Pain in right knee: Secondary | ICD-10-CM | POA: Diagnosis not present

## 2016-01-12 DIAGNOSIS — M25562 Pain in left knee: Secondary | ICD-10-CM | POA: Diagnosis not present

## 2016-01-31 ENCOUNTER — Ambulatory Visit
Admission: RE | Admit: 2016-01-31 | Discharge: 2016-01-31 | Disposition: A | Discharge status: Home / Self Care | Payer: PPO | Source: Ambulatory Visit | Attending: Family Medicine | Admitting: Family Medicine

## 2016-01-31 DIAGNOSIS — Z1231 Encounter for screening mammogram for malignant neoplasm of breast: Secondary | ICD-10-CM | POA: Diagnosis not present

## 2016-10-02 NOTE — Progress Notes (Signed)
Medicare Annual Preventive Care Visit  (initial annual wellness or annual wellness exam)  Concerns and/or follow up today: Due for PPSV23, flu vaccine, hep c screening, colonoscopy in October  See HM section in Epic for other details of completed HM. See scanned documentation under Media Tab for further documentation HPI, health risk assessment. See Media Tab and Care Teams sections in Epic for other providers.  ROS: negative for report of fevers, unintentional weight loss, vision changes, vision loss, hearing loss or change, chest pain, sob, hemoptysis, melena, hematochezia, hematuria, genital discharge or lesions, falls, bleeding or bruising, loc, thoughts of suicide or self harm, memory loss  1.) Patient-completed health risk assessment  - completed and reviewed, see scanned documentation  2.) Review of Medical History: -PMH, PSH, Family History and current specialty and care providers reviewed and updated and listed below  - see scanned in document in chart and below  Past Medical History:  Diagnosis Date  . Constipation    chronic, improved with healthy diet  . Endocervical polyp   . Hemorrhoids    resolved  . Osteoarthritis, knee 06/27/2015   -saw [facility]   . Osteopenia 06/27/2015   -reports on medication remotely with gyn     Past Surgical History:  Procedure Laterality Date  . BACK SURGERY    . CESAREAN SECTION     2    Social History   Social History  . Marital status: Married    Spouse name: N/A  . Number of children: N/A  . Years of education: N/A   Occupational History  . Not on file.   Social History Main Topics  . Smoking status: Former Research scientist (life sciences)  . Smokeless tobacco: Never Used  . Alcohol use No  . Drug use: No  . Sexual activity: No   Other Topics Concern  . Not on file   Social History Narrative   Work or School: retired, used to be a Librarian, academic for [employer]      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; as a Engineer, materials; husband and mother  passed in 2013      Spiritual Beliefs: Christian      Lifestyle: exercising; eating healthy          Family History  Problem Relation Age of Onset  . Hyperlipidemia Other   . Hypertension Other   . Depression Other   . Cancer Other        colon    Current Outpatient Prescriptions on File Prior to Visit  Medication Sig Dispense Refill  . Acetaminophen 500 MG coapsule Take by mouth.    . Cholecalciferol (VITAMIN D3 PO) Take 1,000 Units by mouth.    . TURMERIC PO Take by mouth.     No current facility-administered medications on file prior to visit.      3.) Review of functional ability and level of safety:  Any difficulty hearing?  See scanned documentation  History of falling?  See scanned documentation  Any trouble with IADLs - using a phone, using transportation, grocery shopping, preparing meals, doing housework, doing laundry, taking medications and managing money?  See scanned documentation  Advance Directives?  Discussed briefly and offered more resources and detailed discussion with our trained staff.   See summary of recommendations in Patient Instructions below.  4.) Physical Exam Vitals:   10/04/16 0817  BP: 122/80  Pulse: 73  Temp: 97.7 F (36.5 C)   Estimated body mass index is 32.03 kg/m as calculated from the following:  Height as of this encounter: 5' 1.25" (1.556 m).   Weight as of this encounter: 170 lb 14.4 oz (77.5 kg).  EKG (optional): deferred GENERAL: vitals reviewed and listed below, visual acuity grossly intact, alert, oriented, appears well hydrated and in no acute distress  HEENT: head atraumatic, PERRLA, normal appearance of eyes, ears, nose and mouth. moist mucus membranes.  NECK: supple, no masses or lymphadenopathy  LUNGS: clear to auscultation bilaterally, no rales, rhonchi or wheeze  CV: HRRR, no peripheral edema or cyanosis, normal pedal pulses  ABDOMEN: bowel sounds normal, soft, non tender to palpation, no  masses, no rebound or guarding  GU/BREAST: declined today  SKIN: no rash or abnormal lesions  MS: normal gait, moves all extremities normally  NEURO: normal gait, speech and thought processing grossly intact, muscle tone grossly intact throughout  PSYCH: normal affect, pleasant and cooperative  Cognitive function grossly intact  See patient instructions for recommendations.  Education and counseling regarding the above review of health provided with a plan for the following: -see scanned patient completed form for further details -fall prevention strategies discussed  -healthy lifestyle discussed -importance and resources for completing advanced directives discussed -see patient instructions below for any other recommendations provided  4)The following written screening schedule of preventive measures were reviewed with assessment and plan made per below, orders and patient instructions:      AAA screening done if applicable     Alcohol screening done     Obesity Screening and counseling done     STI screening (Hep C if born 1946-65) offered and per pt wishes     Tobacco Screening done done       Pneumococcal (PPSV23 -one dose after 64, one before if risk factors), influenza yearly and hepatitis B vaccines (if high risk - end stage renal disease, IV drugs, homosexual men, live in home for mentally retarded, hemophilia receiving factors) ASSESSMENT/PLAN: done today      Screening mammograph (yearly if >40) ASSESSMENT/PLAN: advised to do yearly, not due today      Screening Pap smear/pelvic exam (q2 years) ASSESSMENT/PLAN: normal 2013     Prostate cancer screening ASSESSMENT/PLAN: n/a, declined - reports all normal in the past      Colorectal cancer screening (FOBT yearly or flex sig q4y or colonoscopy q10y or barium enema q4y) ASSESSMENT/PLAN: due in October - discussed options, she wants to check with insurance and perhaps do cologuard instead, advise to let us know  so we can order      Diabetes outpatient self-management training services ASSESSMENT/PLAN: utd or done      Bone mass measurements(covered q2y if indicated - estrogen def, osteoporosis, hyperparathyroid, vertebral abnormalities, osteoporosis or steroids) ASSESSMENT/PLAN: utd - done 08/2015 w/ recs for vist D and wt bearing exercise with repeat in 2-3 years      Screening for glaucoma(q1y if high risk - diabetes, FH, AA and > 50 or hispanic and > 65) ASSESSMENT/PLAN: utd or advised      Medical nutritional therapy for individuals with diabetes or renal disease ASSESSMENT/PLAN: see orders      Cardiovascular screening blood tests (lipids q5y) ASSESSMENT/PLAN: see orders and labs      Diabetes screening tests ASSESSMENT/PLAN: see orders and labs   7.) Summary:   Encounter for preventive health examination - Plan: Lipid panel, Hemoglobin A1c  Hep C screening for baby boomer  - Plan: Hepatitis C antibody -risk factors and conditions per above assessment were discussed and treatment, recommendations and referrals were  offered per documentation above and orders and patient instructions.  Patient Instructions   BEFORE YOU LEAVE: -PPSV23 vaccine -schedule flu shot for pt -labs -follow up: yearly for CPE with Dr. Thane Edu with [facility]   Colleen Coffey , Thank you for taking time to come for your Medicare Wellness Visit. I appreciate your ongoing commitment to your health goals. Please review the following plan we discussed and let me know if I can assist you in the future.   These are the goals we discussed: Goals    Call your insurance to see if they will cover the cologuard then let Wendie Simmer know so that we can order for you.  Mammogram in December  Vit D3 1000 IU daily      This is a list of the screening recommended for you and due dates:  Health Maintenance  Topic Date Due  . Pneumonia vaccines (2 of 2 - PPSV23) Done today  . Flu Shot  Schedule in 1 month  .  Hepatitis C:  One time screening is recommended by Center for Disease Control  (CDC) for  adults born from [birth year] through 1966.   09/21/2025*  . Colon Cancer Screening  11/18/2016  . Mammogram  01/30/2018  . Tetanus Vaccine  01/05/2019  . DEXA scan (bone density measurement)  Completed - repeat in 2 years  *Topic was postponed. The date shown is not the original due date.   Health Maintenance, Female Adopting a healthy lifestyle and getting preventive care can go a long way to promote health and wellness. Talk with your health care provider about what schedule of regular examinations is right for you. This is a good chance for you to check in with your provider about disease prevention and staying healthy. In between checkups, there are plenty of things you can do on your own. Experts have done a lot of research about which lifestyle changes and preventive measures are most likely to keep you healthy. Ask your health care provider for more information. Weight and diet Eat a healthy diet  Be sure to include plenty of vegetables, fruits, low-fat dairy products, and lean protein.  Do not eat a lot of foods high in solid fats, added sugars, or salt.  Get regular exercise. This is one of the most important things you can do for your health. ? Most adults should exercise for at least 150 minutes each week. The exercise should increase your heart rate and make you sweat (moderate-intensity exercise). ? Most adults should also do strengthening exercises at least twice a week. This is in addition to the moderate-intensity exercise.  Maintain a healthy weight  Body mass index (BMI) is a measurement that can be used to identify possible weight problems. It estimates body fat based on height and weight. Your health care provider can help determine your BMI and help you achieve or maintain a healthy weight.  For females 46 years of age and older: ? A BMI below 18.5 is considered underweight. ? A BMI of 18.5 to 24.9 is  normal. ? A BMI of 25 to 29.9 is considered overweight. ? A BMI of 30 and above is considered obese.  Watch levels of cholesterol and blood lipids  You should start having your blood tested for lipids and cholesterol at 67 years of age, then have this test every 5 years.  You may need to have your cholesterol levels checked more often if: ? Your lipid or cholesterol levels are high. ? You are older  than 67 years of age. ? You are at high risk for heart disease.  Cancer screening Lung Cancer  Lung cancer screening is recommended for adults 27-71 years old who are at high risk for lung cancer because of a history of smoking.  A yearly low-dose CT scan of the lungs is recommended for people who: ? Currently smoke. ? Have quit within the past 15 years. ? Have at least a 30-pack-year history of smoking. A pack year is smoking an average of one pack of cigarettes a day for 1 year.  Yearly screening should continue until it has been 15 years since you quit.  Yearly screening should stop if you develop a health problem that would prevent you from having lung cancer treatment.  Breast Cancer  Practice breast self-awareness. This means understanding how your breasts normally appear and feel.  It also means doing regular breast self-exams. Let your health care provider know about any changes, no matter how small.  If you are in your 20s or 30s, you should have a clinical breast exam (CBE) by a health care provider every 1-3 years as part of a regular health exam.  If you are 91 or older, have a CBE every year. Also consider having a breast X-ray (mammogram) every year.  If you have a family history of breast cancer, talk to your health care provider about genetic screening.  If you are at high risk for breast cancer, talk to your health care provider about having an MRI and a mammogram every year.  Breast cancer gene (BRCA) assessment is recommended for women who have family members  with BRCA-related cancers. BRCA-related cancers include: ? Breast. ? Ovarian. ? Tubal. ? Peritoneal cancers.  Results of the assessment will determine the need for genetic counseling and BRCA1 and BRCA2 testing.  Cervical Cancer Your health care provider may recommend that you be screened regularly for cancer of the pelvic organs (ovaries, uterus, and vagina). This screening involves a pelvic examination, including checking for microscopic changes to the surface of your cervix (Pap test). You may be encouraged to have this screening done every 3 years, beginning at age 38.  For women ages 52-65, health care providers may recommend pelvic exams and Pap testing every 3 years, or they may recommend the Pap and pelvic exam, combined with testing for human papilloma virus (HPV), every 5 years. Some types of HPV increase your risk of cervical cancer. Testing for HPV may also be done on women of any age with unclear Pap test results.  Other health care providers may not recommend any screening for nonpregnant women who are considered low risk for pelvic cancer and who do not have symptoms. Ask your health care provider if a screening pelvic exam is right for you.  If you have had past treatment for cervical cancer or a condition that could lead to cancer, you need Pap tests and screening for cancer for at least 20 years after your treatment. If Pap tests have been discontinued, your risk factors (such as having a new sexual partner) need to be reassessed to determine if screening should resume. Some women have medical problems that increase the chance of getting cervical cancer. In these cases, your health care provider may recommend more frequent screening and Pap tests.  Colorectal Cancer  This type of cancer can be detected and often prevented.  Routine colorectal cancer screening usually begins at 67 years of age and continues through 67 years of age.  Your health  care provider may recommend  screening at an earlier age if you have risk factors for colon cancer.  Your health care provider may also recommend using home test kits to check for hidden blood in the stool.  A small camera at the end of a tube can be used to examine your colon directly (sigmoidoscopy or colonoscopy). This is done to check for the earliest forms of colorectal cancer.  Routine screening usually begins at age 24.  Direct examination of the colon should be repeated every 5-10 years through 67 years of age. However, you may need to be screened more often if early forms of precancerous polyps or small growths are found.  Skin Cancer  Check your skin from head to toe regularly.  Tell your health care provider about any new moles or changes in moles, especially if there is a change in a mole's shape or color.  Also tell your health care provider if you have a mole that is larger than the size of a pencil eraser.  Always use sunscreen. Apply sunscreen liberally and repeatedly throughout the day.  Protect yourself by wearing long sleeves, pants, a wide-brimmed hat, and sunglasses whenever you are outside.  Heart disease, diabetes, and high blood pressure  High blood pressure causes heart disease and increases the risk of stroke. High blood pressure is more likely to develop in: ? People who have blood pressure in the high end of the normal range (130-139/85-89 mm Hg). ? People who are overweight or obese. ? People who are African American.  If you are 20-58 years of age, have your blood pressure checked every 3-5 years. If you are 58 years of age or older, have your blood pressure checked every year. You should have your blood pressure measured twice-once when you are at a hospital or clinic, and once when you are not at a hospital or clinic. Record the average of the two measurements. To check your blood pressure when you are not at a hospital or clinic, you can use: ? An automated blood pressure machine at  a pharmacy. ? A home blood pressure monitor.  If you are between 83 years and 32 years old, ask your health care provider if you should take aspirin to prevent strokes.  Have regular diabetes screenings. This involves taking a blood sample to check your fasting blood sugar level. ? If you are at a normal weight and have a low risk for diabetes, have this test once every three years after 67 years of age. ? If you are overweight and have a high risk for diabetes, consider being tested at a younger age or more often. Preventing infection Hepatitis B  If you have a higher risk for hepatitis B, you should be screened for this virus. You are considered at high risk for hepatitis B if: ? You were born in a country where hepatitis B is common. Ask your health care provider which countries are considered high risk. ? Your parents were born in a high-risk country, and you have not been immunized against hepatitis B (hepatitis B vaccine). ? You have HIV or AIDS. ? You use needles to inject street drugs. ? You live with someone who has hepatitis B. ? You have had sex with someone who has hepatitis B. ? You get hemodialysis treatment. ? You take certain medicines for conditions, including cancer, organ transplantation, and autoimmune conditions.  Hepatitis C  Blood testing is recommended for: ? Everyone born from [birth year] through 1966. ?  Anyone with known risk factors for hepatitis C.  Sexually transmitted infections (STIs)  You should be screened for sexually transmitted infections (STIs) including gonorrhea and chlamydia if: ? You are sexually active and are younger than 67 years of age. ? You are older than 67 years of age and your health care provider tells you that you are at risk for this type of infection. ? Your sexual activity has changed since you were last screened and you are at an increased risk for chlamydia or gonorrhea. Ask your health care provider if you are at risk.  If you do  not have HIV, but are at risk, it may be recommended that you take a prescription medicine daily to prevent HIV infection. This is called pre-exposure prophylaxis (PrEP). You are considered at risk if: ? You are sexually active and do not regularly use condoms or know the HIV status of your partner(s). ? You take drugs by injection. ? You are sexually active with a partner who has HIV.  Talk with your health care provider about whether you are at high risk of being infected with HIV. If you choose to begin PrEP, you should first be tested for HIV. You should then be tested every 3 months for as long as you are taking PrEP. Pregnancy  If you are premenopausal and you may become pregnant, ask your health care provider about preconception counseling.  If you may become pregnant, take 400 to 800 micrograms (mcg) of folic acid every day.  If you want to prevent pregnancy, talk to your health care provider about birth control (contraception). Osteoporosis and menopause  Osteoporosis is a disease in which the bones lose minerals and strength with aging. This can result in serious bone fractures. Your risk for osteoporosis can be identified using a bone density scan.  If you are 48 years of age or older, or if you are at risk for osteoporosis and fractures, ask your health care provider if you should be screened.  Ask your health care provider whether you should take a calcium or vitamin D supplement to lower your risk for osteoporosis.  Menopause may have certain physical symptoms and risks.  Hormone replacement therapy may reduce some of these symptoms and risks. Talk to your health care provider about whether hormone replacement therapy is right for you. Follow these instructions at home:  Schedule regular health, dental, and eye exams.  Stay current with your immunizations.  Do not use any tobacco products including cigarettes, chewing tobacco, or electronic cigarettes.  If you are  pregnant, do not drink alcohol.  If you are breastfeeding, limit how much and how often you drink alcohol.  Limit alcohol intake to no more than 1 drink per day for nonpregnant women. One drink equals 12 ounces of beer, 5 ounces of wine, or 1 ounces of hard liquor.  Do not use street drugs.  Do not share needles.  Ask your health care provider for help if you need support or information about quitting drugs.  Tell your health care provider if you often feel depressed.  Tell your health care provider if you have ever been abused or do not feel safe at home. This information is not intended to replace advice given to you by your health care provider. Make sure you discuss any questions you have with your health care provider. Document Released: 08/14/2010 Document Revised: 07/07/2015 Document Reviewed: 11/02/2014 Elsevier Interactive Patient Education  2018 [employer].  WE NOW [facility] FAST TRACK!!!  SAME DAY Appointments for ACUTE CARE  Such as: Sprains, Injuries, cuts, abrasions, rashes, muscle pain, joint pain, back pain Colds, flu, sore throats, headache, allergies, cough, fever  Ear pain, sinus and eye infections Abdominal pain, nausea, vomiting, diarrhea, upset stomach Animal/insect bites  3 Easy Ways to Schedule: Walk-In Scheduling Call in scheduling Mychart Sign-up: [email or web address]           Colleen Benton R., DO

## 2016-10-04 ENCOUNTER — Encounter: Payer: Self-pay | Admitting: Family Medicine

## 2016-10-04 ENCOUNTER — Telehealth: Payer: Self-pay | Admitting: Family Medicine

## 2016-10-04 ENCOUNTER — Ambulatory Visit (INDEPENDENT_AMBULATORY_CARE_PROVIDER_SITE_OTHER): Payer: PPO | Admitting: Family Medicine

## 2016-10-04 VITALS — BP 122/80 | HR 73 | Temp 97.7°F | Ht 61.25 in | Wt 170.9 lb

## 2016-10-04 DIAGNOSIS — Z7289 Other problems related to lifestyle: Secondary | ICD-10-CM | POA: Diagnosis not present

## 2016-10-04 DIAGNOSIS — Z23 Encounter for immunization: Secondary | ICD-10-CM | POA: Diagnosis not present

## 2016-10-04 DIAGNOSIS — Z0000 Encounter for general adult medical examination without abnormal findings: Secondary | ICD-10-CM

## 2016-10-04 LAB — HEMOGLOBIN A1C: Hgb A1c MFr Bld: 5.4 % (ref 4.6–6.5)

## 2016-10-04 LAB — LIPID PANEL
CHOLESTEROL: 245 mg/dL — AB (ref 0–200)
HDL: 75.9 mg/dL (ref >39.00)
LDL Cholesterol: 148 mg/dL — ABNORMAL HIGH (ref 0–99)
NONHDL: 169.19
Total CHOL/HDL Ratio: 3
Triglycerides: 107 mg/dL (ref 0.0–149.0)
VLDL: 21.4 mg/dL (ref 0.0–40.0)

## 2016-10-04 LAB — HEPATITIS C ANTIBODY: HCV Ab: NONREACTIVE

## 2016-10-04 NOTE — Patient Instructions (Signed)
BEFORE YOU LEAVE: -PPSV23 vaccine -schedule flu shot for pt -labs -follow up: yearly for CPE with Dr. Thane Edu with [facility]   Ms. Chittum , Thank you for taking time to come for your Medicare Wellness Visit. I appreciate your ongoing commitment to your health goals. Please review the following plan we discussed and let me know if I can assist you in the future.   These are the goals we discussed: Goals    Call your insurance to see if they will cover the cologuard then let Wendie Simmer know so that we can order for you.  Mammogram in December  Vit D3 1000 IU daily      This is a list of the screening recommended for you and due dates:  Health Maintenance  Topic Date Due  . Pneumonia vaccines (2 of 2 - PPSV23) Done today  . Flu Shot  Schedule in 1 month  .  Hepatitis C: One time screening is recommended by Center for Disease Control  (CDC) for  adults born from [birth year] through 1966.   09/21/2025*  . Colon Cancer Screening  11/18/2016  . Mammogram  01/30/2018  . Tetanus Vaccine  01/05/2019  . DEXA scan (bone density measurement)  Completed - repeat in 2 years  *Topic was postponed. The date shown is not the original due date.   Health Maintenance, Female Adopting a healthy lifestyle and getting preventive care can go a long way to promote health and wellness. Talk with your health care provider about what schedule of regular examinations is right for you. This is a good chance for you to check in with your provider about disease prevention and staying healthy. In between checkups, there are plenty of things you can do on your own. Experts have done a lot of research about which lifestyle changes and preventive measures are most likely to keep you healthy. Ask your health care provider for more information. Weight and diet Eat a healthy diet  Be sure to include plenty of vegetables, fruits, low-fat dairy products, and lean protein.  Do not eat a lot of foods high in solid fats, added sugars,  or salt.  Get regular exercise. This is one of the most important things you can do for your health. ? Most adults should exercise for at least 150 minutes each week. The exercise should increase your heart rate and make you sweat (moderate-intensity exercise). ? Most adults should also do strengthening exercises at least twice a week. This is in addition to the moderate-intensity exercise.  Maintain a healthy weight  Body mass index (BMI) is a measurement that can be used to identify possible weight problems. It estimates body fat based on height and weight. Your health care provider can help determine your BMI and help you achieve or maintain a healthy weight.  For females 37 years of age and older: ? A BMI below 18.5 is considered underweight. ? A BMI of 18.5 to 24.9 is normal. ? A BMI of 25 to 29.9 is considered overweight. ? A BMI of 30 and above is considered obese.  Watch levels of cholesterol and blood lipids  You should start having your blood tested for lipids and cholesterol at 67 years of age, then have this test every 5 years.  You may need to have your cholesterol levels checked more often if: ? Your lipid or cholesterol levels are high. ? You are older than 67 years of age. ? You are at high risk for heart disease.  Cancer screening Lung Cancer  Lung cancer screening is recommended for adults 58-36 years old who are at high risk for lung cancer because of a history of smoking.  A yearly low-dose CT scan of the lungs is recommended for people who: ? Currently smoke. ? Have quit within the past 15 years. ? Have at least a 30-pack-year history of smoking. A pack year is smoking an average of one pack of cigarettes a day for 1 year.  Yearly screening should continue until it has been 15 years since you quit.  Yearly screening should stop if you develop a health problem that would prevent you from having lung cancer treatment.  Breast Cancer  Practice breast  self-awareness. This means understanding how your breasts normally appear and feel.  It also means doing regular breast self-exams. Let your health care provider know about any changes, no matter how small.  If you are in your 20s or 30s, you should have a clinical breast exam (CBE) by a health care provider every 1-3 years as part of a regular health exam.  If you are 38 or older, have a CBE every year. Also consider having a breast X-ray (mammogram) every year.  If you have a family history of breast cancer, talk to your health care provider about genetic screening.  If you are at high risk for breast cancer, talk to your health care provider about having an MRI and a mammogram every year.  Breast cancer gene (BRCA) assessment is recommended for women who have family members with BRCA-related cancers. BRCA-related cancers include: ? Breast. ? Ovarian. ? Tubal. ? Peritoneal cancers.  Results of the assessment will determine the need for genetic counseling and BRCA1 and BRCA2 testing.  Cervical Cancer Your health care provider may recommend that you be screened regularly for cancer of the pelvic organs (ovaries, uterus, and vagina). This screening involves a pelvic examination, including checking for microscopic changes to the surface of your cervix (Pap test). You may be encouraged to have this screening done every 3 years, beginning at age 57.  For women ages 52-65, health care providers may recommend pelvic exams and Pap testing every 3 years, or they may recommend the Pap and pelvic exam, combined with testing for human papilloma virus (HPV), every 5 years. Some types of HPV increase your risk of cervical cancer. Testing for HPV may also be done on women of any age with unclear Pap test results.  Other health care providers may not recommend any screening for nonpregnant women who are considered low risk for pelvic cancer and who do not have symptoms. Ask your health care provider if a  screening pelvic exam is right for you.  If you have had past treatment for cervical cancer or a condition that could lead to cancer, you need Pap tests and screening for cancer for at least 20 years after your treatment. If Pap tests have been discontinued, your risk factors (such as having a new sexual partner) need to be reassessed to determine if screening should resume. Some women have medical problems that increase the chance of getting cervical cancer. In these cases, your health care provider may recommend more frequent screening and Pap tests.  Colorectal Cancer  This type of cancer can be detected and often prevented.  Routine colorectal cancer screening usually begins at 67 years of age and continues through 66 years of age.  Your health care provider may recommend screening at an earlier age if you have risk factors  for colon cancer.  Your health care provider may also recommend using home test kits to check for hidden blood in the stool.  A small camera at the end of a tube can be used to examine your colon directly (sigmoidoscopy or colonoscopy). This is done to check for the earliest forms of colorectal cancer.  Routine screening usually begins at age 42.  Direct examination of the colon should be repeated every 5-10 years through 67 years of age. However, you may need to be screened more often if early forms of precancerous polyps or small growths are found.  Skin Cancer  Check your skin from head to toe regularly.  Tell your health care provider about any new moles or changes in moles, especially if there is a change in a mole's shape or color.  Also tell your health care provider if you have a mole that is larger than the size of a pencil eraser.  Always use sunscreen. Apply sunscreen liberally and repeatedly throughout the day.  Protect yourself by wearing long sleeves, pants, a wide-brimmed hat, and sunglasses whenever you are outside.  Heart disease, diabetes, and  high blood pressure  High blood pressure causes heart disease and increases the risk of stroke. High blood pressure is more likely to develop in: ? People who have blood pressure in the high end of the normal range (130-139/85-89 mm Hg). ? People who are overweight or obese. ? People who are African American.  If you are 25-29 years of age, have your blood pressure checked every 3-5 years. If you are 38 years of age or older, have your blood pressure checked every year. You should have your blood pressure measured twice-once when you are at a hospital or clinic, and once when you are not at a hospital or clinic. Record the average of the two measurements. To check your blood pressure when you are not at a hospital or clinic, you can use: ? An automated blood pressure machine at a pharmacy. ? A home blood pressure monitor.  If you are between 27 years and 31 years old, ask your health care provider if you should take aspirin to prevent strokes.  Have regular diabetes screenings. This involves taking a blood sample to check your fasting blood sugar level. ? If you are at a normal weight and have a low risk for diabetes, have this test once every three years after 67 years of age. ? If you are overweight and have a high risk for diabetes, consider being tested at a younger age or more often. Preventing infection Hepatitis B  If you have a higher risk for hepatitis B, you should be screened for this virus. You are considered at high risk for hepatitis B if: ? You were born in a country where hepatitis B is common. Ask your health care provider which countries are considered high risk. ? Your parents were born in a high-risk country, and you have not been immunized against hepatitis B (hepatitis B vaccine). ? You have HIV or AIDS. ? You use needles to inject street drugs. ? You live with someone who has hepatitis B. ? You have had sex with someone who has hepatitis B. ? You get hemodialysis  treatment. ? You take certain medicines for conditions, including cancer, organ transplantation, and autoimmune conditions.  Hepatitis C  Blood testing is recommended for: ? Everyone born from [birth year] through 1966. ? Anyone with known risk factors for hepatitis C.  Sexually transmitted infections (STIs)  You should be screened for sexually transmitted infections (STIs) including gonorrhea and chlamydia if: ? You are sexually active and are younger than 67 years of age. ? You are older than 67 years of age and your health care provider tells you that you are at risk for this type of infection. ? Your sexual activity has changed since you were last screened and you are at an increased risk for chlamydia or gonorrhea. Ask your health care provider if you are at risk.  If you do not have HIV, but are at risk, it may be recommended that you take a prescription medicine daily to prevent HIV infection. This is called pre-exposure prophylaxis (PrEP). You are considered at risk if: ? You are sexually active and do not regularly use condoms or know the HIV status of your partner(s). ? You take drugs by injection. ? You are sexually active with a partner who has HIV.  Talk with your health care provider about whether you are at high risk of being infected with HIV. If you choose to begin PrEP, you should first be tested for HIV. You should then be tested every 3 months for as long as you are taking PrEP. Pregnancy  If you are premenopausal and you may become pregnant, ask your health care provider about preconception counseling.  If you may become pregnant, take 400 to 800 micrograms (mcg) of folic acid every day.  If you want to prevent pregnancy, talk to your health care provider about birth control (contraception). Osteoporosis and menopause  Osteoporosis is a disease in which the bones lose minerals and strength with aging. This can result in serious bone fractures. Your risk for osteoporosis  can be identified using a bone density scan.  If you are 30 years of age or older, or if you are at risk for osteoporosis and fractures, ask your health care provider if you should be screened.  Ask your health care provider whether you should take a calcium or vitamin D supplement to lower your risk for osteoporosis.  Menopause may have certain physical symptoms and risks.  Hormone replacement therapy may reduce some of these symptoms and risks. Talk to your health care provider about whether hormone replacement therapy is right for you. Follow these instructions at home:  Schedule regular health, dental, and eye exams.  Stay current with your immunizations.  Do not use any tobacco products including cigarettes, chewing tobacco, or electronic cigarettes.  If you are pregnant, do not drink alcohol.  If you are breastfeeding, limit how much and how often you drink alcohol.  Limit alcohol intake to no more than 1 drink per day for nonpregnant women. One drink equals 12 ounces of beer, 5 ounces of wine, or 1 ounces of hard liquor.  Do not use street drugs.  Do not share needles.  Ask your health care provider for help if you need support or information about quitting drugs.  Tell your health care provider if you often feel depressed.  Tell your health care provider if you have ever been abused or do not feel safe at home. This information is not intended to replace advice given to you by your health care provider. Make sure you discuss any questions you have with your health care provider. Document Released: 08/14/2010 Document Revised: 07/07/2015 Document Reviewed: 11/02/2014 Elsevier Interactive Patient Education  2018 [employer] NOW OFFER   [facility] FAST TRACK!!!  SAME DAY Appointments for ACUTE CARE  Such as: Sprains, Injuries,  cuts, abrasions, rashes, muscle pain, joint pain, back pain Colds, flu, sore throats, headache, allergies, cough, fever   Ear pain, sinus and eye infections Abdominal pain, nausea, vomiting, diarrhea, upset stomach Animal/insect bites  3 Easy Ways to Schedule: Walk-In Scheduling Call in scheduling Mychart Sign-up: [email or web address]

## 2016-10-04 NOTE — Telephone Encounter (Signed)
Pt was seen today and has checked with her insurance company  and cologuard is covered as long as its coded preventative.

## 2016-10-04 NOTE — Addendum Note (Signed)
Addended by: Agnes Lawrence on: 10/04/2016 09:59 AM   Modules accepted: Orders

## 2016-10-04 NOTE — Telephone Encounter (Signed)
I called the pt and left a detailed message stating the diagnosis used is for colon cancer screening and she may want to see if this is covered by her insurance.   Order completed and faxed to [employer] at [phone].

## 2016-10-16 ENCOUNTER — Telehealth: Payer: Self-pay | Admitting: Family Medicine

## 2016-10-16 NOTE — Telephone Encounter (Signed)
Pt states her colonoscopy is due in October. Pt has already received the  cologuard kit and wants to know if ok to proceed or should she wait until October?

## 2016-10-18 DIAGNOSIS — Z1212 Encounter for screening for malignant neoplasm of rectum: Secondary | ICD-10-CM | POA: Diagnosis not present

## 2016-10-18 DIAGNOSIS — Z1211 Encounter for screening for malignant neoplasm of colon: Secondary | ICD-10-CM | POA: Diagnosis not present

## 2016-10-18 LAB — COLOGUARD: Cologuard: NEGATIVE

## 2016-10-18 NOTE — Telephone Encounter (Signed)
I left a detailed message at the pts cell number stating she can complete the Cologuard at this time as this test can be completed any time during the year a colonscopy is due.

## 2016-10-25 ENCOUNTER — Ambulatory Visit (INDEPENDENT_AMBULATORY_CARE_PROVIDER_SITE_OTHER): Payer: PPO

## 2016-10-25 DIAGNOSIS — Z23 Encounter for immunization: Secondary | ICD-10-CM

## 2016-10-26 ENCOUNTER — Encounter: Payer: Self-pay | Admitting: Family Medicine

## 2016-11-01 ENCOUNTER — Encounter: Payer: Self-pay | Admitting: Family Medicine

## 2016-12-10 ENCOUNTER — Encounter: Payer: Self-pay | Admitting: Gastroenterology

## 2016-12-17 ENCOUNTER — Other Ambulatory Visit: Payer: Self-pay | Admitting: Family Medicine

## 2016-12-17 DIAGNOSIS — Z1231 Encounter for screening mammogram for malignant neoplasm of breast: Secondary | ICD-10-CM

## 2017-01-29 NOTE — Progress Notes (Deleted)
  HPI:  Follow-up hyperlipidemia: -Lifestyle recommendations advised -Reports ***  ROS: See pertinent positives and negatives per HPI.  Past Medical History:  Diagnosis Date  . Constipation    chronic, improved with healthy diet  . Endocervical polyp   . Hemorrhoids    resolved  . Osteoarthritis, knee 06/27/2015   -saw [facility]   . Osteopenia 06/27/2015   -reports on medication remotely with gyn     Past Surgical History:  Procedure Laterality Date  . BACK SURGERY    . CESAREAN SECTION     2    Family History  Problem Relation Age of Onset  . Hyperlipidemia Other   . Hypertension Other   . Depression Other   . Cancer Other        colon    Social History   Socioeconomic History  . Marital status: Married    Spouse name: Not on file  . Number of children: Not on file  . Years of education: Not on file  . Highest education level: Not on file  Social Needs  . Financial resource strain: Not on file  . Food insecurity - worry: Not on file  . Food insecurity - inability: Not on file  . Transportation needs - medical: Not on file  . Transportation needs - non-medical: Not on file  Occupational History  . Not on file  Tobacco Use  . Smoking status: Former Research scientist (life sciences)  . Smokeless tobacco: Never Used  Substance and Sexual Activity  . Alcohol use: No  . Drug use: No  . Sexual activity: No    Birth control/protection: Post-menopausal  Other Topics Concern  . 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  Acetaminophen 500 MG coapsule, Take by mouth., Disp: , Rfl:  .  APPLE CIDER VINEGAR PO, Take by mouth. Takes 2 tsps every morning, Disp: , Rfl:  .  Cholecalciferol (VITAMIN D3 PO), Take 1,000  Units by mouth., Disp: , Rfl:  .  TURMERIC PO, Take by mouth., Disp: , Rfl:   EXAM:  There were no vitals filed for this visit.  There is no height or weight on file to calculate BMI.  GENERAL: vitals reviewed and listed above, alert, oriented, appears well hydrated and in no acute distress  HEENT: atraumatic, conjunttiva clear, no obvious abnormalities on inspection of external nose and ears  NECK: no obvious masses on inspection  LUNGS: clear to auscultation bilaterally, no wheezes, rales or rhonchi, good air movement  CV: HRRR, no peripheral edema  MS: moves all extremities without noticeable abnormality *** PSYCH: pleasant and cooperative, no obvious depression or anxiety  ASSESSMENT AND PLAN:  Discussed the following assessment and plan:  No diagnosis found.  *** -Patient advised to return or notify a doctor immediately if symptoms worsen or persist or new concerns arise.  There are no Patient Instructions on file for this visit.  Colin Benton R., DO

## 2017-01-31 ENCOUNTER — Ambulatory Visit: Payer: PPO | Admitting: Family Medicine

## 2017-01-31 ENCOUNTER — Ambulatory Visit
Admission: RE | Admit: 2017-01-31 | Discharge: 2017-01-31 | Disposition: A | Discharge status: Home / Self Care | Payer: PPO | Source: Ambulatory Visit | Attending: Family Medicine | Admitting: Family Medicine

## 2017-01-31 DIAGNOSIS — Z1231 Encounter for screening mammogram for malignant neoplasm of breast: Secondary | ICD-10-CM

## 2017-01-31 NOTE — Progress Notes (Signed)
HPI:  Follow-up hyperlipidemia and obesity: -Was advised to make healthy lifestyle changes -Weight 8/18 170 --> 12/18 170 -Lipids 8/18 Cholesterol ratio 3, but LDL 148 -reports tries to exercise/stay active; diet unchanged - seems processed foods and paucity of healthy whole foods Is and issue, not a lot of sweets and does not eat pork or red meat  ROS: See pertinent positives and negatives per HPI.  Past Medical History:  Diagnosis Date  . Constipation    chronic, improved with healthy diet  . Endocervical polyp   . Hemorrhoids    resolved  . Osteoarthritis, knee 06/27/2015   -[facility]   . Osteopenia 06/27/2015   -reports on medication remotely with gyn     Past Surgical History:  Procedure Laterality Date  . BACK SURGERY    . CESAREAN SECTION     2    Family History  Problem Relation Age of Onset  . Hyperlipidemia Other   . Hypertension Other   . Depression Other   . Cancer Other        colon    Social History   Socioeconomic History  . Marital status: Married    Spouse name: None  . Number of children: None  . Years of education: None  . Highest education level: None  Social Needs  . Financial resource strain: None  . Food insecurity - worry: None  . Food insecurity - inability: None  . Transportation needs - medical: None  . Transportation needs - non-medical: None  Occupational History  . None  Tobacco Use  . Smoking status: Former Research scientist (life sciences)  . Smokeless tobacco: Never Used  Substance and Sexual Activity  . Alcohol use: No  . Drug use: No  . Sexual activity: No    Birth control/protection: Post-menopausal  Other Topics Concern  . 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   Acetaminophen 500 MG coapsule, Take by mouth., Disp: , Rfl:  .  APPLE CIDER VINEGAR PO, Take by mouth. Takes 2 tsps every morning, Disp: , Rfl:  .  Cholecalciferol (VITAMIN D3 PO), Take 1,000 Units by mouth., Disp: , Rfl:  .  TURMERIC PO, Take by mouth., Disp: , Rfl:   EXAM:  Vitals:   02/01/17 0920  BP: 120/70  Pulse: 82  Temp: 98.1 F (36.7 C)    Body mass index is 33.17 kg/m.  GENERAL: vitals reviewed and listed above, alert, oriented, appears well hydrated and in no acute distress  HEENT: atraumatic, conjunttiva clear, no obvious abnormalities on inspection of external nose and ears  NECK: no obvious masses on inspection  LUNGS: clear to auscultation bilaterally, no wheezes, rales or rhonchi, good air movement  CV: HRRR, no peripheral edema  MS: moves all extremities without noticeable abnormality  PSYCH: pleasant and cooperative, no obvious depression or anxiety  ASSESSMENT AND PLAN:  Discussed the following assessment and plan: More than 50% of over 25 minutes spent in total in caring for this patient was spent face-to-face with the patient, counseling and/or coordinating care.   Hyperlipidemia, unspecified hyperlipidemia type - Plan: Lipid panel  BMI 33.0-33.9,adult  -Congratulated on healthy parts  of her lifestyle, inquired about barriers healthier lifestyle -Lifestyle recommendations discussed at length, options for healthy, nutritious, easy and affordable meals and snacks was reviewed, summary in patient instructions -Also advised that two other important aspects of maintaining good health include regular daily activity and a positive outlook on life w/ good relationships -Fasting labs, she plans to return for a lab visit -Patient advised to return or notify a doctor immediately if symptoms worsen or persist or new concerns arise.  Patient Instructions  BEFORE YOU LEAVE: -follow up:  1) fasting lab appointment sometime in the next 2 weeks 2) follow up 4-6  months 3)AWV + CPE in after October 04, 2017  Work on the recommendations below and shoot for a 10-20 lb wt reduction in the next 4-6 months.   We recommend the following healthy lifestyle for LIFE: 1) Small portions. But, make sure to get regular (at least 3 per day), healthy meals and small healthy snacks if needed.  2) Eat a healthy clean diet.   TRY TO EAT: -at least 5-7 servings of low sugar, colorful, and nutrient rich vegetables per day (not corn, potatoes or bananas.) -berries are the best choice if you wish to eat fruit (only eat small amounts if trying to reduce weight)  -lean meets (fish, white meat of chicken or [place]) -vegan proteins for some meals - beans or tofu, whole grains, nuts and seeds -Replace bad fats with good fats - good fats include: fish, nuts and seeds, canola oil, olive oil -small amounts of low fat or non fat dairy -small amounts of100 % whole grains - check the lables -drink plenty of water  AVOID: -SUGAR, sweets, anything with added sugar, corn syrup or sweeteners - must read labels as even foods advertised as "healthy" often are loaded with sugar -if you must have a sweetener, small amounts of stevia may be best -sweetened beverages and artificially sweetened beverages -simple starches (rice, bread, potatoes, pasta, chips, etc - small amounts of 100% whole grains are ok) -red meat, pork, butter -fried foods, fast food, processed food, excessive dairy, eggs and coconut.  3)Get at least 150 minutes of sweaty aerobic exercise per week.  4)Reduce stress - consider counseling, meditation and relaxation to balance other aspects of your life.    Colin Benton R., DO

## 2017-02-01 ENCOUNTER — Encounter: Payer: Self-pay | Admitting: Family Medicine

## 2017-02-01 ENCOUNTER — Ambulatory Visit: Payer: PPO | Admitting: Family Medicine

## 2017-02-01 VITALS — BP 120/70 | HR 82 | Temp 98.1°F | Ht 61.25 in | Wt 177.0 lb

## 2017-02-01 DIAGNOSIS — E785 Hyperlipidemia, unspecified: Secondary | ICD-10-CM

## 2017-02-01 DIAGNOSIS — Z6833 Body mass index (BMI) 33.0-33.9, adult: Secondary | ICD-10-CM | POA: Diagnosis not present

## 2017-02-01 NOTE — Patient Instructions (Addendum)
BEFORE YOU LEAVE: -follow up:  1) fasting lab appointment sometime in the next 2 weeks 2) follow up 4-6 months 3)AWV + CPE in after October 04, 2017  Work on the recommendations below and shoot for a 10-20 lb wt reduction in the next 4-6 months.   We recommend the following healthy lifestyle for LIFE: 1) Small portions. But, make sure to get regular (at least 3 per day), healthy meals and small healthy snacks if needed.  2) Eat a healthy clean diet.   TRY TO EAT: -at least 5-7 servings of low sugar, colorful, and nutrient rich vegetables per day (not corn, potatoes or bananas.) -berries are the best choice if you wish to eat fruit (only eat small amounts if trying to reduce weight)  -lean meets (fish, white meat of chicken or [place]) -vegan proteins for some meals - beans or tofu, whole grains, nuts and seeds -Replace bad fats with good fats - good fats include: fish, nuts and seeds, canola oil, olive oil -small amounts of low fat or non fat dairy -small amounts of100 % whole grains - check the lables -drink plenty of water  AVOID: -SUGAR, sweets, anything with added sugar, corn syrup or sweeteners - must read labels as even foods advertised as "healthy" often are loaded with sugar -if you must have a sweetener, small amounts of stevia may be best -sweetened beverages and artificially sweetened beverages -simple starches (rice, bread, potatoes, pasta, chips, etc - small amounts of 100% whole grains are ok) -red meat, pork, butter -fried foods, fast food, processed food, excessive dairy, eggs and coconut.  3)Get at least 150 minutes of sweaty aerobic exercise per week.  4)Reduce stress - consider counseling, meditation and relaxation to balance other aspects of your life.

## 2017-02-04 ENCOUNTER — Other Ambulatory Visit: Payer: PPO

## 2017-02-04 LAB — LIPID PANEL
Cholesterol: 187 mg/dL (ref 0–200)
HDL: 69.7 mg/dL
LDL Cholesterol: 98 mg/dL (ref 0–99)
NonHDL: 117.56
Total CHOL/HDL Ratio: 3
Triglycerides: 100 mg/dL (ref 0.0–149.0)
VLDL: 20 mg/dL (ref 0.0–40.0)

## 2017-02-21 ENCOUNTER — Encounter: Payer: Self-pay | Admitting: Family Medicine

## 2017-05-28 ENCOUNTER — Ambulatory Visit (INDEPENDENT_AMBULATORY_CARE_PROVIDER_SITE_OTHER): Payer: PPO | Admitting: Family Medicine

## 2017-05-28 ENCOUNTER — Encounter: Payer: Self-pay | Admitting: Family Medicine

## 2017-05-28 VITALS — BP 124/78 | HR 85 | Temp 98.2°F | Ht 61.25 in | Wt 165.7 lb

## 2017-05-28 DIAGNOSIS — M25511 Pain in right shoulder: Secondary | ICD-10-CM

## 2017-05-28 DIAGNOSIS — M79601 Pain in right arm: Secondary | ICD-10-CM

## 2017-05-28 DIAGNOSIS — L989 Disorder of the skin and subcutaneous tissue, unspecified: Secondary | ICD-10-CM

## 2017-05-28 NOTE — Progress Notes (Signed)
HPI:  Using dictation device. Unfortunately this device frequently misinterprets words/phrases.  AWV 10/04/16  Acute visit for arm pain: -started 3-4 weeks ago after a fall -tripped on a step and caught herself with R arm -since has had some intermittent soreness in the R upper shoulder, sometimes twinges of pain in forearm as well -no weakness, numbness, swelling, bruising -ibuprofen helps -lifting things worsens it  Skin lesion. She desires removal. R forearm for 1 year. No pain or pruritis. Has picked at it lot.   ROS: See pertinent positives and negatives per HPI.  Past Medical History:  Diagnosis Date  . Constipation    chronic, improved with healthy diet  . Endocervical polyp   . Hemorrhoids    resolved  . Osteoarthritis, knee 06/27/2015   -saw [facility]   . Osteopenia 06/27/2015   -reports on medication remotely with gyn     Past Surgical History:  Procedure Laterality Date  . BACK SURGERY    . CESAREAN SECTION     2    Family History  Problem Relation Age of Onset  . Hyperlipidemia Other   . Hypertension Other   . Depression Other   . Cancer Other        colon    SOCIAL HX: see hpi and chart   Current Outpatient Medications:  .  Acetaminophen 500 MG coapsule, Take by mouth., Disp: , Rfl:  .  APPLE CIDER VINEGAR PO, Take by mouth. Takes 2 tsps every morning, Disp: , Rfl:  .  Cholecalciferol (VITAMIN D3 PO), Take 1,000 Units by mouth., Disp: , Rfl:  .  TURMERIC PO, Take by mouth., Disp: , Rfl:   EXAM:  Vitals:   05/28/17 1256  BP: 124/78  Pulse: 85  Temp: 98.2 F (36.8 C)    Body mass index is 31.05 kg/m.  GENERAL: vitals reviewed and listed above, alert, oriented, appears well hydrated and in no acute distress  HEENT: atraumatic, conjunttiva clear, no obvious abnormalities on inspection of external nose and ears  NECK: no obvious masses on inspection  MS: moves all extremities without noticeable abnormality except for abd R  shoulder causes pain, TTP RTC attachment to humerus on the R, normal strength/sensitivity to light touch upper extremities bilat, NV intact distal  SKIN: small, firm, hyperpigmented nodule R forearm < 1cm in diameter, dimples when pinched  PSYCH: pleasant and cooperative, no obvious depression or anxiety  ASSESSMENT AND PLAN:  Discussed the following assessment and plan:  Acute pain of right shoulder - Plan: Ambulatory referral to Physical Therapy --we discussed possible serious and likely etiologies for her shoulder and arm pain, workup and treatment, treatment risks and return precautions -suspect rtc strain/aprain -optef for formal PT -referral placed -nsaid, heat, top menthol prn pain  Pain of right upper extremity -query strain forearm, see above  Skin lesion -? Dermatofibroma -pt desires removal, discussed risks and pot for scar vs benefit possibility of confirming dx - she wants to remove at next visit  -Patient advised to return or notify a doctor immediately if symptoms worsen or persist or new concerns arise.  Patient Instructions  BEFORE YOU LEAVE: -follow up: procedure visit/follow up in 1 month  -We placed a referral for you as discussed for the physical therapy for the arm. It usually takes about 1-2 weeks to process and schedule this referral. If you have not heard from [place] regarding this appointment in 2 weeks please contact our office.  -can use heat, tiger balm and  ibuprofen per instructions as needed for pain  -follow up sooner if worsening or new concerns     Lucretia Kern, DO

## 2017-05-28 NOTE — Patient Instructions (Signed)
BEFORE YOU LEAVE: -follow up: procedure visit/follow up in 1 month  -We placed a referral for you as discussed for the physical therapy for the arm. It usually takes about 1-2 weeks to process and schedule this referral. If you have not heard from [place] regarding this appointment in 2 weeks please contact our office.  -can use heat, tiger balm and ibuprofen per instructions as needed for pain  -follow up sooner if worsening or new concerns

## 2017-06-04 ENCOUNTER — Encounter: Payer: Self-pay | Admitting: Physical Therapy

## 2017-06-04 ENCOUNTER — Ambulatory Visit: Payer: PPO | Attending: Family Medicine | Admitting: Physical Therapy

## 2017-06-04 DIAGNOSIS — M25511 Pain in right shoulder: Secondary | ICD-10-CM

## 2017-06-04 DIAGNOSIS — R293 Abnormal posture: Secondary | ICD-10-CM | POA: Insufficient documentation

## 2017-06-04 DIAGNOSIS — M25611 Stiffness of right shoulder, not elsewhere classified: Secondary | ICD-10-CM | POA: Insufficient documentation

## 2017-06-04 NOTE — Patient Instructions (Signed)
Access Code: [email or web address]  URL: [email or web address]  Date: 06/04/2017  Prepared by: Faustino Congress   Exercises  Standing Backward Shoulder Rolls - 10 reps - 1 sets - 2x daily - 7x weekly  Seated Scapular Retraction - 10 reps - 1 sets - 5 sec hold - 2x daily - 7x weekly  Seated Shoulder Inferior Glide - 5-10 reps - 1 sets - 10 sec hold - 2x daily - 7x weekly

## 2017-06-04 NOTE — Therapy (Signed)
[facility] [address] [place], [place], [address] Phone: [phone]   Fax:  [phone]  Physical Therapy Evaluation  Patient Details  Name: Colleen Coffey MRN: [id number] Date of Birth: January 19, 1951 Referring Provider: Lucretia Kern, DO   Encounter Date: 06/04/2017  PT End of Session - 06/04/17 1138    Visit Number  1    Number of Visits  12    Date for PT Re-Evaluation  07/16/17    Authorization Type  HTA $15 copay    PT Start Time  1055    PT Stop Time  1135    PT Time Calculation (min)  40 min    Activity Tolerance  Patient tolerated treatment well    Behavior During Therapy  [facility] for tasks assessed/performed       Past Medical History:  Diagnosis Date  . Constipation    chronic, improved with healthy diet  . Endocervical polyp   . Hemorrhoids    resolved  . Osteoarthritis, knee 06/27/2015   -saw [facility]   . Osteopenia 06/27/2015   -reports on medication remotely with gyn     Past Surgical History:  Procedure Laterality Date  . BACK SURGERY    . CESAREAN SECTION     2    There were no vitals filed for this visit.   Subjective Assessment - 06/04/17 1057    Subjective  Pt is a 67 y/o female who presents to [facility] s/p fall up top step approx 1 month ago injuring Rt shoulder.  Pt reports she continues to have nagging pain in Rt shoulder especially with overuse.      Limitations  Lifting;House hold activities    Patient Stated Goals  improve pain, be able to do granddaughter's hair without pain    Currently in Pain?  Yes    Pain Score  1  up to 7/10; at best 0/10    Pain Location  Shoulder    Pain Orientation  Right    Pain Descriptors / Indicators  Stabbing;Throbbing;Aching    Pain Radiating Towards  into Rt forearm    Pain Onset  More than a month ago    Pain Frequency  Intermittent    Aggravating Factors   repetivite movements (esp abduction), overuse    Pain Relieving Factors   tylenol or ibuprofen         [facility] PT Assessment - 06/04/17 1101      Assessment   Medical Diagnosis  Acute pain of right shoulder    Referring Provider  Lucretia Kern, DO    Onset Date/Surgical Date  05/06/17    Hand Dominance  Right    Next MD Visit  07/02/17    Prior Therapy  previously on low back      Precautions   Precautions  None      Restrictions   Weight Bearing Restrictions  No      Balance Screen   Has the patient fallen in the past 6 months  Yes    How many times?  1    Has the patient had a decrease in activity level because of a fear of falling?   No    Is the patient reluctant to leave their home because of a fear of falling?   No      Home Environment   Living Environment  Private residence    Living Arrangements  Alone 7# dog    Type  of [place] to enter    Entrance Stairs-Number of Steps  4    Entrance Stairs-Rails  Right;Left;Can reach both    Home Layout  Two level;Bed/bath upstairs    Alternate Level Stairs-Number of Steps  15    Alternate Level Stairs-Rails  Left      Prior Function   Level of Independence  Independent    Vocation  Retired    [employer]  retired from [employer]    Leisure  go to gym (water classes) 4x/wk, walking, spend time with family      Cognition   Overall Cognitive Status  Within Functional Limits for tasks assessed      Observation/Other Assessments   Focus on Therapeutic Outcomes (FOTO)   49 (51% limited; predicted 32% limited)      Posture/Postural Control   Posture/Postural Control  Postural limitations    Postural Limitations  Rounded Shoulders;Forward head      ROM / Strength   AROM / PROM / Strength  AROM;Strength;PROM      AROM   Overall AROM Comments  flexion and abduction measured sitting    AROM Assessment Site  Shoulder    Right/Left Shoulder  Right    Right Shoulder Flexion  124 Degrees    Right Shoulder ABduction  130 Degrees    Right Shoulder Internal Rotation  80  Degrees    Right Shoulder External Rotation  76 Degrees      PROM   PROM Assessment Site  Shoulder    Right/Left Shoulder  Right    Right Shoulder Flexion  140 Degrees    Right Shoulder ABduction  152 Degrees    Right Shoulder Internal Rotation  85 Degrees    Right Shoulder External Rotation  84 Degrees      Strength   Strength Assessment Site  Hand;Shoulder;Elbow    Right/Left Shoulder  Right    Right Shoulder Flexion  3-/5    Right Shoulder ABduction  3-/5    Right Shoulder Internal Rotation  4/5    Right Shoulder External Rotation  3-/5    Right/Left hand  Right;Left    Right Hand Grip (lbs)  35.67 39, 31, 37    Left Hand Grip (lbs)  49.33 55, 47, 46      Special Tests    Special Tests  Rotator Cuff Impingement    Rotator Cuff Impingment tests  Michel Bickers test;Empty Can test      Hawkins-Kennedy test   Findings  Positive    Side  Right      Empty Can test   Findings  Positive    Side  Right                Objective measurements completed on examination: See above findings.      [facility] Adult PT Treatment/Exercise - 06/04/17 1101      Exercises   Exercises  Shoulder      Shoulder Exercises: Seated   Retraction  Both;5 reps 5 sec hold    Other Seated Exercises  shoulder rolls backward x 10 reps    Other Seated Exercises  self inf mobilization (distraction) 3x10 sec             PT Education - 06/04/17 1137    Education provided  Yes    Education Details  HEP    Person(s) Educated  Patient    Methods  Explanation;Demonstration;Handout    Comprehension  Verbalized understanding;Returned demonstration;Need further instruction          PT Long Term Goals - 06/04/17 1347      PT LONG TERM GOAL #1   Title  independent with HEP    Status  New    Target Date  07/16/17      PT LONG TERM GOAL #2   Title  improve Rt shoulder AROM to WNL for improved function    Status  New    Target Date  07/16/17      PT LONG TERM GOAL #3   Title   improve Rt grip strength to at least 45# for improved strength    Status  New    Target Date  07/16/17      PT LONG TERM GOAL #4   Title  report pain < 3/10 with ADLs for improved function    Status  New    Target Date  07/16/17             Plan - 06/04/17 1138    Clinical Impression Statement  Pt is a 67 y/o female who presents to [facility] for Rt shoulder pain x 1 month following fall up last step of home.  Pt demonstrates decreased ROM and strength as well as pain and postural abnormalities.  Findings consistent with RTC impingement and will benefit from PT to address deficits listed.  Will monitor for changes that may indicate need for further imaging.    Clinical Presentation  Stable    Clinical Decision Making  Low    Rehab Potential  Good    PT Frequency  2x / week may have to decr to 1x/wk pending pt's availability    PT Duration  6 weeks    PT Treatment/Interventions  ADLs/Self Care Home Management;Cryotherapy;Electrical Stimulation;Iontophoresis 4mg /ml Dexamethasone;Moist Heat;Therapeutic exercise;Therapeutic activities;Ultrasound;Neuromuscular re-education;Patient/family education;Manual techniques;Vasopneumatic Device;Taping;Dry needling;Passive range of motion    PT Next Visit Plan  review HEP; posture and ROM as tolerated, gentle strengthening, manual/modalities PRN    PT Home Exercise Plan  Access Code: [email or web address]    Consulted and Agree with Plan of Care  Patient       Patient will benefit from skilled therapeutic intervention in order to improve the following deficits and impairments:  Pain, Impaired UE functional use, Decreased strength, Decreased range of motion, Postural dysfunction  Visit Diagnosis: Acute pain of right shoulder - Plan: PT plan of care cert/re-cert  Stiffness of right shoulder, not elsewhere classified - Plan: PT plan of care cert/re-cert  Abnormal posture - Plan: PT plan of care cert/re-cert     Problem List Patient Active Problem List    Diagnosis Date Noted  . Osteoarthritis, knee 06/27/2015  . BMI 28.0-28.9,adult 06/27/2015  . Constipation 06/27/2015  . Osteopenia 06/27/2015      Laureen Abrahams, PT, DPT 06/04/17 1:51 PM     [facility] [address] [place], [place], [address] Phone: [phone]   Fax:  [phone]  Name: Colleen Coffey MRN: [id number] Date of Birth: [birth year]

## 2017-06-11 ENCOUNTER — Ambulatory Visit: Payer: PPO | Admitting: Physical Therapy

## 2017-06-11 ENCOUNTER — Encounter: Payer: Self-pay | Admitting: Physical Therapy

## 2017-06-11 DIAGNOSIS — M25511 Pain in right shoulder: Secondary | ICD-10-CM | POA: Diagnosis not present

## 2017-06-11 DIAGNOSIS — M25611 Stiffness of right shoulder, not elsewhere classified: Secondary | ICD-10-CM

## 2017-06-11 DIAGNOSIS — R293 Abnormal posture: Secondary | ICD-10-CM

## 2017-06-11 NOTE — Patient Instructions (Signed)
Access Code: [email or web address]  URL: [email or web address]  Date: 06/11/2017  Prepared by: Faustino Congress   Exercises  Standing Backward Shoulder Rolls - 10 reps - 1 sets - 2x daily - 7x weekly  Seated Scapular Retraction - 10 reps - 1 sets - 5 sec hold - 2x daily - 7x weekly  Seated Shoulder Inferior Glide - 5-10 reps - 1 sets - 10 sec hold - 2x daily - 7x weekly  Supine Shoulder Flexion with Dowel - 10 reps - 1 sets - 3-5 sec hold - 2x daily - 7x weekly  Supine Shoulder External Rotation with Dowel - 10 reps - 1 sets - 1-2 sec hold - 2x daily - 7x weekly

## 2017-06-11 NOTE — Therapy (Signed)
[facility] [address] [place], [place], [address] Phone: [phone]   Fax:  [phone]  Physical Therapy Treatment  Patient Details  Name: Colleen Coffey MRN: [id number] Date of Birth: 1950/07/03 Referring Provider: Lucretia Kern, DO   Encounter Date: 06/11/2017  PT End of Session - 06/11/17 1228    Visit Number  2    Number of Visits  12    Date for PT Re-Evaluation  07/16/17    Authorization Type  HTA $15 copay    PT Start Time  1150    PT Stop Time  1228    PT Time Calculation (min)  38 min    Activity Tolerance  Patient tolerated treatment well    Behavior During Therapy  [facility] for tasks assessed/performed       Past Medical History:  Diagnosis Date  . Constipation    chronic, improved with healthy diet  . Endocervical polyp   . Hemorrhoids    resolved  . Osteoarthritis, knee 06/27/2015   -saw [facility]   . Osteopenia 06/27/2015   -reports on medication remotely with gyn     Past Surgical History:  Procedure Laterality Date  . BACK SURGERY    . CESAREAN SECTION     2    There were no vitals filed for this visit.  Subjective Assessment - 06/11/17 1152    Subjective  feels the motions is getting better, but pain seems to be about the same.  doing a little more with Rt arm despite pain; seems to be worse at night    Patient Stated Goals  improve pain, be able to do granddaughter's hair without pain    Pain Score  7  with motion 7/10; at rest 3/10 "I don't even feel it"    Pain Location  Shoulder    Pain Orientation  Right    Pain Descriptors / Indicators  Aching;Throbbing;Stabbing    Pain Onset  More than a month ago    Pain Frequency  Intermittent    Aggravating Factors   repetitive movements (esp abduction), overuse    Pain Relieving Factors  tylenol, ibuprofen                       [facility] Adult PT Treatment/Exercise - 06/11/17 1154      Shoulder Exercises:  Supine   Flexion  AAROM;Both;10 reps cane      Shoulder Exercises: Seated   Retraction  Both;10 reps 5 sec hold    Other Seated Exercises  shoulder rolls backward x 10 reps    Other Seated Exercises  self inf mobilization (distraction) 5x10 sec      Shoulder Exercises: Pulleys   Flexion  3 minutes    Scaption  3 minutes      Manual Therapy   Manual Therapy  Joint mobilization;Passive ROM    Joint Mobilization  Rt shoulder A/P and inf grades 2-3    Passive ROM  Rt shoulder all motions with end range holds             PT Education - 06/11/17 1230    Education provided  Yes    Education Details  HEP    Person(s) Educated  Patient    Methods  Explanation;Demonstration;Handout    Comprehension  Verbalized understanding;Returned demonstration;Need further instruction          PT Long Term Goals - 06/04/17 1347  PT LONG TERM GOAL #1   Title  independent with HEP    Status  New    Target Date  07/16/17      PT LONG TERM GOAL #2   Title  improve Rt shoulder AROM to WNL for improved function    Status  New    Target Date  07/16/17      PT LONG TERM GOAL #3   Title  improve Rt grip strength to at least 45# for improved strength    Status  New    Target Date  07/16/17      PT LONG TERM GOAL #4   Title  report pain < 3/10 with ADLs for improved function    Status  New    Target Date  07/16/17            Plan - 06/11/17 1229    Clinical Impression Statement  Pt tolerated session well and progressed HEP to add cane exercises for ROM.  Progressing well with PT and will cotninue to benefit from PT to maximize function.    PT Treatment/Interventions  ADLs/Self Care Home Management;Cryotherapy;Electrical Stimulation;Iontophoresis 4mg /ml Dexamethasone;Moist Heat;Therapeutic exercise;Therapeutic activities;Ultrasound;Neuromuscular re-education;Patient/family education;Manual techniques;Vasopneumatic Device;Taping;Dry needling;Passive range of motion    PT Next  Visit Plan  review HEP; posture and ROM as tolerated, gentle strengthening, manual/modalities PRN    PT Home Exercise Plan  Access Code: [email or web address]    Consulted and Agree with Plan of Care  Patient       Patient will benefit from skilled therapeutic intervention in order to improve the following deficits and impairments:  Pain, Impaired UE functional use, Decreased strength, Decreased range of motion, Postural dysfunction  Visit Diagnosis: Acute pain of right shoulder  Stiffness of right shoulder, not elsewhere classified  Abnormal posture     Problem List Patient Active Problem List   Diagnosis Date Noted  . Osteoarthritis, knee 06/27/2015  . BMI 28.0-28.9,adult 06/27/2015  . Constipation 06/27/2015  . Osteopenia 06/27/2015      Laureen Abrahams, PT, DPT 06/11/17 12:30 PM     [facility] [address], [place], [place], [address] Phone: [phone]   Fax:  [phone]  Name: Colleen Coffey MRN: [id number] Date of Birth: 02-16-1950

## 2017-06-13 ENCOUNTER — Encounter: Payer: PPO | Admitting: Physical Therapy

## 2017-06-18 ENCOUNTER — Ambulatory Visit: Payer: PPO | Attending: Family Medicine | Admitting: Physical Therapy

## 2017-06-18 ENCOUNTER — Encounter: Payer: Self-pay | Admitting: Physical Therapy

## 2017-06-18 DIAGNOSIS — M25611 Stiffness of right shoulder, not elsewhere classified: Secondary | ICD-10-CM | POA: Diagnosis not present

## 2017-06-18 DIAGNOSIS — R293 Abnormal posture: Secondary | ICD-10-CM | POA: Insufficient documentation

## 2017-06-18 DIAGNOSIS — M25511 Pain in right shoulder: Secondary | ICD-10-CM | POA: Diagnosis not present

## 2017-06-18 NOTE — Therapy (Signed)
[facility] [address], [place], [place], [address] Phone: [phone]   Fax:  [phone]  Physical Therapy Treatment  Patient Details  Name: Colleen Coffey MRN: [id number] Date of Birth: 10-Jan-1951 Referring Provider: Lucretia Kern, DO   Encounter Date: 06/18/2017  PT End of Session - 06/18/17 1233    Visit Number  3    Number of Visits  12    Date for PT Re-Evaluation  07/16/17    Authorization Type  HTA $15 copay    PT Start Time  1147    PT Stop Time  1240    PT Time Calculation (min)  53 min    Activity Tolerance  Patient tolerated treatment well    Behavior During Therapy  [facility] for tasks assessed/performed       Past Medical History:  Diagnosis Date  . Constipation    chronic, improved with healthy diet  . Endocervical polyp   . Hemorrhoids    resolved  . Osteoarthritis, knee 06/27/2015   -saw [facility]   . Osteopenia 06/27/2015   -reports on medication remotely with gyn     Past Surgical History:  Procedure Laterality Date  . BACK SURGERY    . CESAREAN SECTION     2    There were no vitals filed for this visit.  Subjective Assessment - 06/18/17 1148    Subjective  doing well; shoulder is feeling better, "my whole arm aches."  sleeping better at night and reports that she's able to sleep for short periods on Rt side    Patient Stated Goals  improve pain, be able to do granddaughter's hair without pain    Currently in Pain?  Yes    Pain Score  4     Pain Location  Shoulder    Pain Orientation  Right    Pain Descriptors / Indicators  Aching;Stabbing;Throbbing    Pain Type  Acute pain    Pain Onset  More than a month ago    Pain Frequency  Intermittent    Aggravating Factors   repetitive movements (esp abduction), overuse    Pain Relieving Factors  tylenol, ibuprofen                       [facility] Adult PT Treatment/Exercise - 06/18/17 1148      Shoulder Exercises:  Supine   Protraction  Right;20 reps;Weights    Protraction Weight (lbs)  2    Flexion  Right;20 reps;Weights    Shoulder Flexion Weight (lbs)  2      Shoulder Exercises: Sidelying   External Rotation  Right;20 reps;Weights    External Rotation Weight (lbs)  2      Shoulder Exercises: Standing   External Rotation  Right;Theraband;20 reps    Theraband Level (Shoulder External Rotation)  Level 2 (Red)    Internal Rotation  Right;20 reps;Theraband    Theraband Level (Shoulder Internal Rotation)  Level 2 (Red)    Row  Both;20 reps;Theraband    Theraband Level (Shoulder Row)  Level 2 (Red)      Shoulder Exercises: Pulleys   Flexion  3 minutes    Scaption  3 minutes      Modalities   Modalities  Moist Heat;Electrical Stimulation      Moist Heat Therapy   Number Minutes Moist Heat  15 Minutes    Moist Heat Location  Shoulder      Electrical Stimulation  Electrical Stimulation Location  Rt shoulder    Electrical Stimulation Action  IFC    Electrical Stimulation Parameters  to tolerance x 15 min    Electrical Stimulation Goals  Pain                  PT Long Term Goals - 06/04/17 1347      PT LONG TERM GOAL #1   Title  independent with HEP    Status  New    Target Date  07/16/17      PT LONG TERM GOAL #2   Title  improve Rt shoulder AROM to WNL for improved function    Status  New    Target Date  07/16/17      PT LONG TERM GOAL #3   Title  improve Rt grip strength to at least 45# for improved strength    Status  New    Target Date  07/16/17      PT LONG TERM GOAL #4   Title  report pain < 3/10 with ADLs for improved function    Status  New    Target Date  07/16/17            Plan - 06/18/17 1235    Clinical Impression Statement  Able to progress strengthening exercises today with expected fatigue response following, but no increase in pain.  Added estim/heat following session to help with muscle pain.  Will continue to benefit from PT to maximize  function.    PT Treatment/Interventions  ADLs/Self Care Home Management;Cryotherapy;Electrical Stimulation;Iontophoresis 4mg /ml Dexamethasone;Moist Heat;Therapeutic exercise;Therapeutic activities;Ultrasound;Neuromuscular re-education;Patient/family education;Manual techniques;Vasopneumatic Device;Taping;Dry needling;Passive range of motion    PT Next Visit Plan  review HEP; posture and ROM as tolerated, gentle strengthening, manual/modalities PRN    PT Home Exercise Plan  Access Code: [email or web address]    Consulted and Agree with Plan of Care  Patient       Patient will benefit from skilled therapeutic intervention in order to improve the following deficits and impairments:  Pain, Impaired UE functional use, Decreased strength, Decreased range of motion, Postural dysfunction  Visit Diagnosis: Acute pain of right shoulder  Stiffness of right shoulder, not elsewhere classified  Abnormal posture     Problem List Patient Active Problem List   Diagnosis Date Noted  . Osteoarthritis, knee 06/27/2015  . BMI 28.0-28.9,adult 06/27/2015  . Constipation 06/27/2015  . Osteopenia 06/27/2015      Laureen Abrahams, PT, DPT 06/18/17 12:41 PM      [facility] [address] [place], [place], [address] Phone: [phone]   Fax:  [phone]  Name: Colleen Coffey MRN: [id number] Date of Birth: 01-Sep-1950

## 2017-06-20 ENCOUNTER — Ambulatory Visit: Payer: PPO | Admitting: Physical Therapy

## 2017-06-20 ENCOUNTER — Encounter: Payer: Self-pay | Admitting: Physical Therapy

## 2017-06-20 DIAGNOSIS — M25511 Pain in right shoulder: Secondary | ICD-10-CM

## 2017-06-20 DIAGNOSIS — R293 Abnormal posture: Secondary | ICD-10-CM

## 2017-06-20 DIAGNOSIS — M25611 Stiffness of right shoulder, not elsewhere classified: Secondary | ICD-10-CM

## 2017-06-20 NOTE — Therapy (Signed)
[facility] [address] [place], [place], [address] Phone: [phone]   Fax:  [phone]  Physical Therapy Treatment  Patient Details  Name: Colleen Coffey MRN: [id number] Date of Birth: 07/26/1950 Referring Provider: Lucretia Kern, DO   Encounter Date: 06/20/2017  PT End of Session - 06/20/17 1015    Visit Number  4    Number of Visits  12    Date for PT Re-Evaluation  07/16/17    Authorization Type  HTA $15 copay    PT Start Time  1016    PT Stop Time  1101    PT Time Calculation (min)  45 min    Activity Tolerance  Patient tolerated treatment well    Behavior During Therapy  [facility] for tasks assessed/performed       Past Medical History:  Diagnosis Date  . Constipation    chronic, improved with healthy diet  . Endocervical polyp   . Hemorrhoids    resolved  . Osteoarthritis, knee 06/27/2015   -[facility]   . Osteopenia 06/27/2015   -reports on medication remotely with gyn     Past Surgical History:  Procedure Laterality Date  . BACK SURGERY    . CESAREAN SECTION     2    There were no vitals filed for this visit.  Subjective Assessment - 06/20/17 1018    Subjective  Feels a little achy.      Patient Stated Goals  improve pain, be able to do granddaughter's hair without pain    Currently in Pain?  Yes    Pain Score  2     Pain Location  Shoulder    Pain Orientation  Right    Pain Descriptors / Indicators  Aching    Pain Onset  More than a month ago    Pain Frequency  Intermittent    Aggravating Factors   certain movement    Pain Relieving Factors  tylenol, ibuprofen                       [facility] Adult PT Treatment/Exercise - 06/20/17 0001      Shoulder Exercises: Seated   External Rotation  Strengthening;Both;20 reps;Theraband cues for posture      Shoulder Exercises: Standing   External Rotation  Right;Theraband;20 reps    Theraband Level (Shoulder External  Rotation)  Level 2 (Red)    Internal Rotation  Right;20 reps;Theraband    Theraband Level (Shoulder Internal Rotation)  Level 2 (Red)    Extension  Strengthening;Both;20 reps;Theraband cues for posture    Diagonals  Strengthening;20 reps;Theraband right arm to stabilize, left to lift      Shoulder Exercises: Pulleys   Flexion  3 minutes    Scaption  3 minutes      Moist Heat Therapy   Number Minutes Moist Heat  15 Minutes    Moist Heat Location  Shoulder      Electrical Stimulation   Electrical Stimulation Location  Rt shoulder    Electrical Stimulation Action  IFC    Electrical Stimulation Parameters  to tolerance x 15 min    Electrical Stimulation Goals  Pain                  PT Long Term Goals - 06/04/17 1347      PT LONG TERM GOAL #1   Title  independent with HEP    Status  New  Target Date  07/16/17      PT LONG TERM GOAL #2   Title  improve Rt shoulder AROM to WNL for improved function    Status  New    Target Date  07/16/17      PT LONG TERM GOAL #3   Title  improve Rt grip strength to at least 45# for improved strength    Status  New    Target Date  07/16/17      PT LONG TERM GOAL #4   Title  report pain < 3/10 with ADLs for improved function    Status  New    Target Date  07/16/17            Plan - 06/20/17 1059    Clinical Impression Statement  Pt was able to progress strengthening.  She responds well to stim for pain relief . Pt continues to need cues for posture and preventing increased shoulder elevation.  Pt will benefit from skilled PT to ocntinue strengthening with posture control.    PT Treatment/Interventions  ADLs/Self Care Home Management;Cryotherapy;Electrical Stimulation;Iontophoresis 4mg /ml Dexamethasone;Moist Heat;Therapeutic exercise;Therapeutic activities;Ultrasound;Neuromuscular re-education;Patient/family education;Manual techniques;Vasopneumatic Device;Taping;Dry needling;Passive range of motion    PT Next Visit Plan   review HEP; posture and ROM as tolerated, gentle strengthening, manual/modalities PRN    PT Home Exercise Plan  Access Code: [email or web address]    Consulted and Agree with Plan of Care  Patient       Patient will benefit from skilled therapeutic intervention in order to improve the following deficits and impairments:  Pain, Impaired UE functional use, Decreased strength, Decreased range of motion, Postural dysfunction  Visit Diagnosis: Acute pain of right shoulder  Stiffness of right shoulder, not elsewhere classified  Abnormal posture     Problem List Patient Active Problem List   Diagnosis Date Noted  . Osteoarthritis, knee 06/27/2015  . BMI 28.0-28.9,adult 06/27/2015  . Constipation 06/27/2015  . Osteopenia 06/27/2015    Zannie Cove 06/20/2017, 11:05 AM  [facility] [address], [place], [place], [address] Phone: [phone]   Fax:  [phone]  Name: Colleen Coffey MRN: [id number] Date of Birth: [birth year]

## 2017-06-20 NOTE — Patient Instructions (Signed)
[facility] [address], [place], [place] [address] Phone # [phone] Fax [phone]

## 2017-06-20 NOTE — Therapy (Signed)
[facility] [address] [place], [place], [address] Phone: [phone]   Fax:  [phone]  Physical Therapy Treatment  Patient Details  Name: Colleen Coffey MRN: [id number] Date of Birth: 01-28-1951 Referring Provider: Lucretia Kern, DO   Encounter Date: 06/20/2017  PT End of Session - 06/20/17 1015    Visit Number  4    Number of Visits  12    Date for PT Re-Evaluation  07/16/17    Authorization Type  HTA $15 copay    PT Start Time  1016    PT Stop Time  1101    PT Time Calculation (min)  45 min    Activity Tolerance  Patient tolerated treatment well    Behavior During Therapy  [facility] for tasks assessed/performed       Past Medical History:  Diagnosis Date  . Constipation    chronic, improved with healthy diet  . Endocervical polyp   . Hemorrhoids    resolved  . Osteoarthritis, knee 06/27/2015   -[facility]   . Osteopenia 06/27/2015   -reports on medication remotely with gyn     Past Surgical History:  Procedure Laterality Date  . BACK SURGERY    . CESAREAN SECTION     2    There were no vitals filed for this visit.  Subjective Assessment - 06/20/17 1018    Subjective  Feels a little achy.      Patient Stated Goals  improve pain, be able to do granddaughter's hair without pain    Currently in Pain?  Yes    Pain Score  2     Pain Location  Shoulder    Pain Orientation  Right    Pain Descriptors / Indicators  Aching    Pain Onset  More than a month ago    Pain Frequency  Intermittent    Aggravating Factors   certain movement    Pain Relieving Factors  tylenol, ibuprofen                       [facility] Adult PT Treatment/Exercise - 06/20/17 0001      Shoulder Exercises: Seated   External Rotation  Strengthening;Both;20 reps;Theraband cues for posture      Shoulder Exercises: Standing   External Rotation  Right;Theraband;20 reps    Theraband Level (Shoulder External  Rotation)  Level 2 (Red)    Internal Rotation  Right;20 reps;Theraband    Theraband Level (Shoulder Internal Rotation)  Level 2 (Red)    Extension  Strengthening;Both;20 reps;Theraband cues for posture    Diagonals  Strengthening;20 reps;Theraband right arm to stabilize, left to lift      Shoulder Exercises: Pulleys   Flexion  3 minutes    Scaption  3 minutes      Moist Heat Therapy   Number Minutes Moist Heat  15 Minutes    Moist Heat Location  Shoulder      Electrical Stimulation   Electrical Stimulation Location  Rt shoulder    Electrical Stimulation Action  IFC    Electrical Stimulation Parameters  to tolerance x 15 min    Electrical Stimulation Goals  Pain                  PT Long Term Goals - 06/04/17 1347      PT LONG TERM GOAL #1   Title  independent with HEP    Status  New  Target Date  07/16/17      PT LONG TERM GOAL #2   Title  improve Rt shoulder AROM to WNL for improved function    Status  New    Target Date  07/16/17      PT LONG TERM GOAL #3   Title  improve Rt grip strength to at least 45# for improved strength    Status  New    Target Date  07/16/17      PT LONG TERM GOAL #4   Title  report pain < 3/10 with ADLs for improved function    Status  New    Target Date  07/16/17            Plan - 06/20/17 1059    Clinical Impression Statement  Pt was able to progress strengthening.  She responds well to stim for pain relief . Pt continues to need cues for posture and preventing increased shoulder elevation.  Pt will benefit from skilled PT to ocntinue strengthening with posture control.    PT Treatment/Interventions  ADLs/Self Care Home Management;Cryotherapy;Electrical Stimulation;Iontophoresis 4mg /ml Dexamethasone;Moist Heat;Therapeutic exercise;Therapeutic activities;Ultrasound;Neuromuscular re-education;Patient/family education;Manual techniques;Vasopneumatic Device;Taping;Dry needling;Passive range of motion    PT Next Visit Plan   review HEP; posture and ROM as tolerated, gentle strengthening, manual/modalities PRN    PT Home Exercise Plan  Access Code: [email or web address]    Consulted and Agree with Plan of Care  Patient       Patient will benefit from skilled therapeutic intervention in order to improve the following deficits and impairments:  Pain, Impaired UE functional use, Decreased strength, Decreased range of motion, Postural dysfunction  Visit Diagnosis: Acute pain of right shoulder  Stiffness of right shoulder, not elsewhere classified  Abnormal posture     Problem List Patient Active Problem List   Diagnosis Date Noted  . Osteoarthritis, knee 06/27/2015  . BMI 28.0-28.9,adult 06/27/2015  . Constipation 06/27/2015  . Osteopenia 06/27/2015    Zannie Cove, PT 06/20/2017, 11:00 AM  [facility] [address], [place], [place], [address] Phone: [phone]   Fax:  [phone]  Name: Colleen Coffey MRN: [id number] Date of Birth: Aug 23, 1950

## 2017-06-25 ENCOUNTER — Encounter: Payer: Self-pay | Admitting: Physical Therapy

## 2017-06-25 ENCOUNTER — Ambulatory Visit: Payer: PPO | Admitting: Physical Therapy

## 2017-06-25 DIAGNOSIS — R293 Abnormal posture: Secondary | ICD-10-CM

## 2017-06-25 DIAGNOSIS — M25611 Stiffness of right shoulder, not elsewhere classified: Secondary | ICD-10-CM

## 2017-06-25 DIAGNOSIS — M25511 Pain in right shoulder: Secondary | ICD-10-CM | POA: Diagnosis not present

## 2017-06-25 NOTE — Therapy (Signed)
[facility] [address], [place], [place], [address] Phone: [phone]   Fax:  [phone]  Physical Therapy Treatment  Patient Details  Name: Colleen Coffey MRN: [id number] Date of Birth: October 04, 1950 Referring Provider: Lucretia Kern, DO   Encounter Date: 06/25/2017  PT End of Session - 06/25/17 1150    Visit Number  5    Number of Visits  12    Date for PT Re-Evaluation  07/16/17    Authorization Type  HTA $15 copay    PT Start Time  1147    PT Stop Time  1227    PT Time Calculation (min)  40 min    Activity Tolerance  Patient tolerated treatment well    Behavior During Therapy  [facility] for tasks assessed/performed       Past Medical History:  Diagnosis Date  . Constipation    chronic, improved with healthy diet  . Endocervical polyp   . Hemorrhoids    resolved  . Osteoarthritis, knee 06/27/2015   -[facility]   . Osteopenia 06/27/2015   -reports on medication remotely with gyn     Past Surgical History:  Procedure Laterality Date  . BACK SURGERY    . CESAREAN SECTION     2    There were no vitals filed for this visit.  Subjective Assessment - 06/25/17 1155    Subjective  Did water aerobics.  I am not having much pain and able to do exercises which is helping.    Limitations  Lifting;House hold activities    Patient Stated Goals  improve pain, be able to do granddaughter's hair without pain    Currently in Pain?  No/denies                       [facility] Adult PT Treatment/Exercise - 06/25/17 0001      Shoulder Exercises: Supine   Horizontal ABduction  Strengthening;Both;20 reps;Theraband    Theraband Level (Shoulder Horizontal ABduction)  Level 2 (Red)    External Rotation  Strengthening;Both;20 reps;Theraband    Theraband Level (Shoulder External Rotation)  Level 2 (Red)    Diagonals  Strengthening;Both;20 reps;Theraband    Theraband Level (Shoulder Diagonals)  Level 2  (Red)      Manual Therapy   Manual Therapy  Joint mobilization;Passive ROM    Joint Mobilization  Rt shoulder A/P and inf grades 2-3    Passive ROM  Rt shoulder all motions with end range holds       wall slides flexion and abduction Wall push ups - 15 x focus on posture           PT Long Term Goals - 06/25/17 1158      PT LONG TERM GOAL #1   Title  independent with HEP    Status  Achieved      PT LONG TERM GOAL #2   Title  improve Rt shoulder AROM to WNL for improved function    Baseline  still less than left shoulder by 10-20 degrees    Status  Partially Met      PT LONG TERM GOAL #3   Title  improve Rt grip strength to at least 45# for improved strength    Baseline  45 lb    Status  Achieved      PT LONG TERM GOAL #4   Title  report pain < 3/10 with ADLs for improved function  Baseline  2/10 at most    Status  Achieved            Plan - 06/25/17 1153    Clinical Impression Statement  Pt is 33% limited based on FOTO measure which is close to predicted outcome.  Pt reports she is able to return to exercise and water aerobics class at this time and comfortable using HEP to continue at home.  Pt has met most of her functional goals at this time.  She is recommended to discharge with HEP today    PT Treatment/Interventions  ADLs/Self Care Home Management;Cryotherapy;Electrical Stimulation;Iontophoresis 79m/ml Dexamethasone;Moist Heat;Therapeutic exercise;Therapeutic activities;Ultrasound;Neuromuscular re-education;Patient/family education;Manual techniques;Vasopneumatic Device;Taping;Dry needling;Passive range of motion    PT Next Visit Plan  discharged today    PT Home Exercise Plan  Access Code: [email or web address]    Consulted and Agree with Plan of Care  Patient       Patient will benefit from skilled therapeutic intervention in order to improve the following deficits and impairments:  Pain, Impaired UE functional use, Decreased strength, Decreased range of motion,  Postural dysfunction  Visit Diagnosis: Acute pain of right shoulder  Stiffness of right shoulder, not elsewhere classified  Abnormal posture     Problem List Patient Active Problem List   Diagnosis Date Noted  . Osteoarthritis, knee 06/27/2015  . BMI 28.0-28.9,adult 06/27/2015  . Constipation 06/27/2015  . Osteopenia 06/27/2015    JZannie Cove PT 06/25/2017, 12:30 PM  [facility] [address] [place] [address]Phone: [phone]  Fax:  [phone] Name: Colleen WHERLEYMRN: [id number] of Birth: 607-Mar-1952 PHYSICAL THERAPY DISCHARGE SUMMARY  Visits from Start of Care: 5  Current functional level related to goals / functional outcomes: See above goals   Remaining deficits: See above   Education / Equipment: HEP Plan: Patient agrees to discharge.  Patient goals were partially met. Patient is being discharged due to being pleased with the current functional level.  ?????    JZannie Cove PT 06/25/17 12:31 PM

## 2017-06-27 ENCOUNTER — Encounter: Payer: PPO | Admitting: Physical Therapy

## 2017-07-01 DIAGNOSIS — H43813 Vitreous degeneration, bilateral: Secondary | ICD-10-CM | POA: Diagnosis not present

## 2017-07-01 DIAGNOSIS — H31093 Other chorioretinal scars, bilateral: Secondary | ICD-10-CM | POA: Diagnosis not present

## 2017-07-01 NOTE — Progress Notes (Addendum)
HPI:  Using dictation device. Unfortunately this device frequently misinterprets words/phrases.  Due for AWV/CPE after 10/04/17  Acute visit for follow up R shoulder pain and skin lesion on R forearm.  Did PT for te R shoulder pain and reports doing much better. Back to regular activities. Does have occasional twinge of discomfort in lateral upper humerus, but overall much better without weakness, numbness, interference in activities.  Skin lesion: Has noticed it for 1 year. No pain or itching. Picks at it. Would like for this to be removed, but has changed mind and does not want to remove today. Reports she forms keloids an dis very worried about scarring here. Has decided to see dermatology.  Hx of obesity and hyperlipidemia. She low significant wt and had reversal hyperlipidemia. However, daughter had baby and she has not been as good recently and has regained wt. Wt 8/18 170, today 168. She wants to work on lifestyle again and then follow up with labs.  ROS: See pertinent positives and negatives per HPI.  Past Medical History:  Diagnosis Date  . Constipation    chronic, improved with healthy diet  . Endocervical polyp   . Hemorrhoids    resolved  . Osteoarthritis, knee 06/27/2015   -saw [facility]   . Osteopenia 06/27/2015   -reports on medication remotely with gyn     Past Surgical History:  Procedure Laterality Date  . BACK SURGERY    . CESAREAN SECTION     2    Family History  Problem Relation Age of Onset  . Hyperlipidemia Other   . Hypertension Other   . Depression Other   . Cancer Other        colon    SOCIAL HX: see pi   Current Outpatient Medications:  .  Acetaminophen 500 MG coapsule, Take by mouth., Disp: , Rfl:  .  APPLE CIDER VINEGAR PO, Take by mouth. Takes 2 tsps every morning, Disp: , Rfl:  .  Cholecalciferol (VITAMIN D3 PO), Take 1,000 Units by mouth., Disp: , Rfl:  .  TURMERIC PO, Take by mouth., Disp: , Rfl:   EXAM:  Vitals:    07/02/17 0840  BP: 120/60  Pulse: 86  Temp: 98 F (36.7 C)    Body mass index is 31.56 kg/m.  GENERAL: vitals reviewed and listed above, alert, oriented, appears well hydrated and in no acute distress  HEENT: atraumatic, conjunttiva clear, no obvious abnormalities on inspection of external nose and ears  NECK: no obvious masses on inspection  LUNGS: clear to auscultation bilaterally, no wheezes, rales or rhonchi, good air movement  CV: HRRR, no peripheral edema  MS: moves all extremities without noticeable abnormality  PSYCH: pleasant and cooperative, no obvious depression or anxiety  ASSESSMENT AND PLAN:  Discussed the following assessment and plan:  Acute pain of right shoulder  Skin lesion  History of hyperlipidemia  (Obesity)BMI 31.0-31.9,adult  -shoulder pain mostly gone, she will get back to regular exercise and do home therapy, follow up as needed -she has opted to see dermatology about the skin lesion, advised to call today to set up appt -lifestyle recs, labs at CPE in september, goal to loose 10 lbs before then -Patient advised to return or notify a doctor immediately if symptoms worsen or persist or new concerns arise.  Patient Instructions  BEFORE YOU LEAVE: -follow up: CPE in September  Call dermatology for appointment.  Continue exercise for shoulder and follow up as needed.  Continue healthy diet and  regular exercise for cholesterol and weight reduction.   We recommend the following healthy lifestyle for LIFE: 1) Small portions. But, make sure to get regular (at least 3 per day), healthy meals and small healthy snacks if needed.  2) Eat a healthy clean diet.   TRY TO EAT: -at least 5-7 servings of low sugar, colorful, and nutrient rich vegetables per day (not corn, potatoes or bananas.) -berries are the best choice if you wish to eat fruit (only eat small amounts if trying to reduce weight)  -lean meets (fish, white meat of chicken or  [place]) -vegan proteins for some meals - beans or tofu, whole grains, nuts and seeds -Replace bad fats with good fats - good fats include: fish, nuts and seeds, canola oil, olive oil -small amounts of low fat or non fat dairy -small amounts of100 % whole grains - check the lables -drink plenty of water  AVOID: -SUGAR, sweets, anything with added sugar, corn syrup or sweeteners - must read labels as even foods advertised as "healthy" often are loaded with sugar -if you must have a sweetener, small amounts of stevia may be best -sweetened beverages and artificially sweetened beverages -simple starches (rice, bread, potatoes, pasta, chips, etc - small amounts of 100% whole grains are ok) -red meat, pork, butter -fried foods, fast food, processed food, excessive dairy, eggs and coconut.  3)Get at least 150 minutes of sweaty aerobic exercise per week.  4)Reduce stress - consider counseling, meditation and relaxation to balance other aspects of your life.     Lucretia Kern, DO

## 2017-07-02 ENCOUNTER — Encounter: Payer: Self-pay | Admitting: *Deleted

## 2017-07-02 ENCOUNTER — Encounter: Payer: Self-pay | Admitting: Family Medicine

## 2017-07-02 ENCOUNTER — Encounter: Payer: PPO | Admitting: Physical Therapy

## 2017-07-02 ENCOUNTER — Ambulatory Visit (INDEPENDENT_AMBULATORY_CARE_PROVIDER_SITE_OTHER): Payer: PPO | Admitting: Family Medicine

## 2017-07-02 VITALS — BP 120/60 | HR 86 | Temp 98.0°F | Ht 61.25 in | Wt 168.4 lb

## 2017-07-02 DIAGNOSIS — Z8639 Personal history of other endocrine, nutritional and metabolic disease: Secondary | ICD-10-CM | POA: Diagnosis not present

## 2017-07-02 DIAGNOSIS — M25511 Pain in right shoulder: Secondary | ICD-10-CM | POA: Diagnosis not present

## 2017-07-02 DIAGNOSIS — Z6831 Body mass index (BMI) 31.0-31.9, adult: Secondary | ICD-10-CM

## 2017-07-02 DIAGNOSIS — L989 Disorder of the skin and subcutaneous tissue, unspecified: Secondary | ICD-10-CM

## 2017-07-02 DIAGNOSIS — E669 Obesity, unspecified: Secondary | ICD-10-CM | POA: Diagnosis not present

## 2017-07-02 NOTE — Patient Instructions (Addendum)
BEFORE YOU LEAVE: -follow up: CPE in September  Call dermatology for appointment.  Continue exercise for shoulder and follow up as needed.  Continue healthy diet and regular exercise for cholesterol and weight reduction.   We recommend the following healthy lifestyle for LIFE: 1) Small portions. But, make sure to get regular (at least 3 per day), healthy meals and small healthy snacks if needed.  2) Eat a healthy clean diet.   TRY TO EAT: -at least 5-7 servings of low sugar, colorful, and nutrient rich vegetables per day (not corn, potatoes or bananas.) -berries are the best choice if you wish to eat fruit (only eat small amounts if trying to reduce weight)  -lean meets (fish, white meat of chicken or [place]) -vegan proteins for some meals - beans or tofu, whole grains, nuts and seeds -Replace bad fats with good fats - good fats include: fish, nuts and seeds, canola oil, olive oil -small amounts of low fat or non fat dairy -small amounts of100 % whole grains - check the lables -drink plenty of water  AVOID: -SUGAR, sweets, anything with added sugar, corn syrup or sweeteners - must read labels as even foods advertised as "healthy" often are loaded with sugar -if you must have a sweetener, small amounts of stevia may be best -sweetened beverages and artificially sweetened beverages -simple starches (rice, bread, potatoes, pasta, chips, etc - small amounts of 100% whole grains are ok) -red meat, pork, butter -fried foods, fast food, processed food, excessive dairy, eggs and coconut.  3)Get at least 150 minutes of sweaty aerobic exercise per week.  4)Reduce stress - consider counseling, meditation and relaxation to balance other aspects of your life.

## 2017-07-04 ENCOUNTER — Ambulatory Visit: Payer: PPO | Admitting: Family Medicine

## 2017-07-04 ENCOUNTER — Encounter: Payer: PPO | Admitting: Physical Therapy

## 2017-08-12 DIAGNOSIS — D2361 Other benign neoplasm of skin of right upper limb, including shoulder: Secondary | ICD-10-CM | POA: Diagnosis not present

## 2017-08-12 DIAGNOSIS — D2372 Other benign neoplasm of skin of left lower limb, including hip: Secondary | ICD-10-CM | POA: Diagnosis not present

## 2017-10-09 NOTE — Progress Notes (Signed)
Medicare Annual Preventive Care Visit  (initial annual wellness or annual wellness exam)  Concerns and/or follow up today:  Colleen Coffey is a pleasant 67 y.o. here for follow up. Chronic medical problems summarized below were reviewed for changes and stability and were updated as needed below. These issues and their treatment remain stable for the most part.  She had a hard time with the diet and exercise over the summer because she was watching several grandchildren.  Now the children are in daycare and school, so she can focus on herself.  She has started exercising and plans to work on her diet. Denies CP, SOB, DOE, treatment intolerance or new symptoms. Due for labs, flu vaccine  Obesity/Hyperlipidemia: -wt 8/18 170 -->  Wt 173 (09/2017) -cholesterol was improved last check  Skin lesion arm: -at last visit she opted to see derm for removal -She saw the dermatologist and they opted to leave the lesion alone  Mild depression: -On screening -She feels it is from the workload over the summer and is improving, it does not feel she needs treatment at this time -No severe symptoms  Osteopenia: -Getting regular exercise and taking vitamin D - due for recheck, last DEXA was in 2016  See HM section in Epic for other details of completed HM. See scanned documentation under Media Tab for further documentation HPI, health risk assessment. See Media Tab and Care Teams sections in Epic for other providers.  ROS: negative for report of fevers, unintentional weight loss, vision changes, vision loss, hearing loss or change, chest pain, sob, hemoptysis, melena, hematochezia, hematuria, genital discharge or lesions, falls, bleeding or bruising, loc, thoughts of suicide or self harm, memory loss  1.) Patient-completed health risk assessment  - completed and reviewed, see scanned documentation  2.) Review of Medical History: -PMH, PSH, Family History and current specialty and care providers  reviewed and updated and listed below  - see scanned in document in chart and below  Past Medical History:  Diagnosis Date  . Constipation    chronic, improved with healthy diet  . Endocervical polyp   . Hemorrhoids    resolved  . Osteoarthritis, knee 06/27/2015   -saw [facility]   . Osteopenia 06/27/2015   -reports on medication remotely with gyn     Past Surgical History:  Procedure Laterality Date  . BACK SURGERY    . CESAREAN SECTION     2    Social History   Socioeconomic History  . Marital status: Widowed    Spouse name: Not on file  . Number of children: Not on file  . Years of education: Not on file  . Highest education level: Not on file  Occupational History  . Not on file  Social Needs  . Financial resource strain: Not on file  . Food insecurity:    Worry: Not on file    Inability: Not on file  . Transportation needs:    Medical: Not on file    Non-medical: Not on file  Tobacco Use  . Smoking status: Former Research scientist (life sciences)  . Smokeless tobacco: Never Used  Substance and Sexual Activity  . Alcohol use: No  . Drug use: No  . Sexual activity: Never    Birth control/protection: Post-menopausal  Lifestyle  . Physical activity:    Days per week: Not on file    Minutes per session: Not on file  . Stress: Not on file  Relationships  . Social connections:    Talks on  phone: Not on file    Gets together: Not on file    Attends religious service: Not on file    Active member of club or organization: Not on file    Attends meetings of clubs or organizations: Not on file    Relationship status: Not on file  . Intimate partner violence:    Fear of current or ex partner: Not on file    Emotionally abused: Not on file    Physically abused: Not on file    Forced sexual activity: Not on file  Other Topics Concern  . Not on file  Social History Narrative   Work or School: retired, used to be a Librarian, academic for [employer]      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; as a  Engineer, materials; husband and mother passed in 2013      Spiritual Beliefs: Christian      Lifestyle: exercising; eating healthy       Family History  Problem Relation Age of Onset  . Hyperlipidemia Other   . Hypertension Other   . Depression Other   . Cancer Other        colon    Current Outpatient Medications on File Prior to Visit  Medication Sig Dispense Refill  . Acetaminophen 500 MG coapsule Take by mouth.    . APPLE CIDER VINEGAR PO Take by mouth. Takes 2 tsps every morning    . Cholecalciferol (VITAMIN D3 PO) Take 1,000 Units by mouth.    . cyanocobalamin (,VITAMIN B-12,) 1000 MCG/ML injection Inject 1,000 mcg into the muscle. One injection twice a week-treated by [facility] per patient    . TURMERIC PO Take by mouth.     No current facility-administered medications on file prior to visit.      3.) Review of functional ability and level of safety:  Any difficulty hearing?  See scanned documentation  History of falling?  See scanned documentation  Any trouble with IADLs - using a phone, using transportation, grocery shopping, preparing meals, doing housework, doing laundry, taking medications and managing money?  See scanned documentation  Advance Directives? Done, she does not wish to change See summary of recommendations in Patient Instructions below.  4.) Physical Exam Vitals:   10/10/17 0905  BP: 122/80  Pulse: 73  Temp: 98 F (36.7 C)   Estimated body mass index is 33.55 kg/m as calculated from the following:   Height as of this encounter: 5' 0.25" (1.53 m).   Weight as of this encounter: 173 lb 3.2 oz (78.6 kg).  EKG (optional): deferred  General: alert, appear well hydrated and in no acute distress  HEENT: visual acuity grossly intact, normal appearance of ear canals and TMs, normal inspection of the oropharynx  CV: HRRR, no peripheral edema  Lungs: CTA bilaterally  ABD: Soft, bowel sounds positive, nontender to palpation  GU/BREAST:  Deferred, does breast exam of the breast center  Psych: pleasant and cooperative, no obvious depression or anxiety  Cognitive function grossly intact  See patient instructions for recommendations.  Education and counseling regarding the above review of health provided with a plan for the following: -see scanned patient completed form for further details -fall prevention strategies discussed  -healthy lifestyle discussed -importance and resources for completing advanced directives discussed -see patient instructions below for any other recommendations provided  4)The following written screening schedule of preventive measures were reviewed with assessment and plan made per below, orders and patient instructions:      AAA  screening done if applicable     Alcohol screening done     Obesity Screening and counseling done     STI screening (Hep C if born [birth year]) offered and per pt wishes     Tobacco Screening done done       Pneumococcal (PPSV23 -one dose after 64, one before if risk factors), influenza yearly and hepatitis B vaccines (if high risk - end stage renal disease, IV drugs, homosexual men, live in home for mentally retarded, hemophilia receiving factors) ASSESSMENT/PLAN: done      Screening mammograph (yearly if >40) ASSESSMENT/PLAN: utd       Colorectal cancer screening (FOBT yearly or flex sig q4y or colonoscopy q10y or barium enema q4y) ASSESSMENT/PLAN: utd       Diabetes outpatient self-management training services ASSESSMENT/PLAN: utd or done      Bone mass measurements(covered q2y if indicated - estrogen def, osteoporosis, hyperparathyroid, vertebral abnormalities, osteoporosis or steroids) ASSESSMENT/PLAN: Due for repeat, ordered today      Screening for glaucoma(q1y if high risk - diabetes, FH, AA and > 50 or hispanic and > 65) ASSESSMENT/PLAN: utd or advised      Medical nutritional therapy for individuals with diabetes or renal disease ASSESSMENT/PLAN: see  orders      Cardiovascular screening blood tests (lipids q5y) ASSESSMENT/PLAN: see orders and labs      Diabetes screening tests ASSESSMENT/PLAN: see orders and labs   7.) Summary:   Medicare annual wellness visit, subsequent -risk factors and conditions per above assessment were discussed and treatment, recommendations and referrals were offered per documentation above and orders and patient instructions.  Physical exam -done  Screening for depression -Mild depression, she does not feel she needs treatment at this time and feels is improving now that she is able to care for self -Monitor  Obesity (BMI 30.0-34.9) - Plan: Hemoglobin A1c Hyperlipidemia, unspecified hyperlipidemia type - Plan: Lipid panel -Had a very lengthy discussion regarding a healthy lifestyle, recommended a healthy low sugar diet.  Fruits are a weakness for her and she eats a lot of high sugar fruits.  We have discussed replacing these with more vegetables and low sugar fruits.  Continue exercise.  Goal to lose 20 pounds in the next 6 months.  Osteopenia, unspecified location -Taking vitamin D, due for DEXA, ordered today  Patient Instructions   BEFORE YOU LEAVE: -flu shot -order DEXA -let her know I will order the bone density as was 2 year sin June -follow up: 4-6 months   Colleen Coffey , Thank you for taking time to come for your Medicare Wellness Visit. I appreciate your ongoing commitment to your health goals. Please review the following plan we discussed and let me know if I can assist you in the future.   These are the goals we discussed: Goals   Weight reduction - shoot for 20 lbs in the next 6 months Eat a healthy low sugar diet and continue regular exercise Bone density test ordered today Consider the new shingles vaccine - check with your insurance     This is a list of the screening recommended for you and due dates:  Health Maintenance  Topic Date Due  . Flu Shot  Done today  .  Tetanus Vaccine  01/05/2019  . Mammogram  02/01/2019  . Cologuard (Stool DNA test)  10/19/2019  . DEXA scan (bone density measurement)  Completed  .  Hepatitis C: One time screening is recommended by Center for Disease Control  (CDC) for  adults born from [birth year] through 1966.   Completed  . Pneumonia vaccines  Completed    Colleen Kern, DO

## 2017-10-10 ENCOUNTER — Encounter: Payer: Self-pay | Admitting: Family Medicine

## 2017-10-10 ENCOUNTER — Ambulatory Visit (INDEPENDENT_AMBULATORY_CARE_PROVIDER_SITE_OTHER): Payer: PPO | Admitting: Family Medicine

## 2017-10-10 VITALS — BP 122/80 | HR 73 | Temp 98.0°F | Ht 60.25 in | Wt 173.2 lb

## 2017-10-10 DIAGNOSIS — F320 Major depressive disorder, single episode, mild: Secondary | ICD-10-CM | POA: Diagnosis not present

## 2017-10-10 DIAGNOSIS — Z0000 Encounter for general adult medical examination without abnormal findings: Secondary | ICD-10-CM | POA: Diagnosis not present

## 2017-10-10 DIAGNOSIS — E785 Hyperlipidemia, unspecified: Secondary | ICD-10-CM | POA: Diagnosis not present

## 2017-10-10 DIAGNOSIS — Z23 Encounter for immunization: Secondary | ICD-10-CM | POA: Diagnosis not present

## 2017-10-10 DIAGNOSIS — M8580 Other specified disorders of bone density and structure, unspecified site: Secondary | ICD-10-CM | POA: Diagnosis not present

## 2017-10-10 DIAGNOSIS — Z1331 Encounter for screening for depression: Secondary | ICD-10-CM

## 2017-10-10 DIAGNOSIS — E669 Obesity, unspecified: Secondary | ICD-10-CM | POA: Diagnosis not present

## 2017-10-10 LAB — LIPID PANEL
Cholesterol: 241 mg/dL — ABNORMAL HIGH (ref 0–200)
HDL: 81 mg/dL (ref >39.00)
LDL CALC: 141 mg/dL — AB (ref 0–99)
NonHDL: 159.71
Total CHOL/HDL Ratio: 3
Triglycerides: 95 mg/dL (ref 0.0–149.0)
VLDL: 19 mg/dL (ref 0.0–40.0)

## 2017-10-10 LAB — HEMOGLOBIN A1C: Hgb A1c MFr Bld: 5.5 % (ref 4.6–6.5)

## 2017-10-10 NOTE — Addendum Note (Signed)
Addended by: Agnes Lawrence on: 10/10/2017 10:16 AM   Modules accepted: Orders

## 2017-10-10 NOTE — Patient Instructions (Addendum)
BEFORE YOU LEAVE: -flu shot -order DEXA -let her know I will order the bone density as was 2 year sin June -follow up: 4-6 months   Colleen Coffey , Thank you for taking time to come for your Medicare Wellness Visit. I appreciate your ongoing commitment to your health goals. Please review the following plan we discussed and let me know if I can assist you in the future.   These are the goals we discussed: Goals   Weight reduction - shoot for 20 lbs in the next 6 months Eat a healthy low sugar diet and continue regular exercise Bone density test ordered today Consider the new shingles vaccine - check with your insurance     This is a list of the screening recommended for you and due dates:  Health Maintenance  Topic Date Due  . Flu Shot  Done today  . Tetanus Vaccine  01/05/2019  . Mammogram  02/01/2019  . Cologuard (Stool DNA test)  10/19/2019  . DEXA scan (bone density measurement)  Completed  .  Hepatitis C: One time screening is recommended by Center for Disease Control  (CDC) for  adults born from [birth year] through 1966.   Completed  . Pneumonia vaccines  Completed

## 2017-10-10 NOTE — Addendum Note (Signed)
Addended by: Lahoma Crocker A on: 10/10/2017 10:20 AM   Modules accepted: Orders

## 2017-10-10 NOTE — Addendum Note (Signed)
Addended by: Agnes Lawrence on: 10/10/2017 10:01 AM   Modules accepted: Orders

## 2017-10-15 ENCOUNTER — Other Ambulatory Visit: Payer: Self-pay | Admitting: Family Medicine

## 2017-10-15 DIAGNOSIS — Z1231 Encounter for screening mammogram for malignant neoplasm of breast: Secondary | ICD-10-CM

## 2018-01-01 NOTE — Progress Notes (Signed)
HPI:  Using dictation device. Unfortunately this device frequently misinterprets words/phrases.  Colleen Coffey is a pleasant 67 y.o. here for follow up. Chronic medical problems summarized below were reviewed for changes and stability and were updated as needed below. These issues and their treatment remain stable for the most part.  Working on diet. Decreased sweets and portions sizes. Wants to check fasting lipids. Scheduled for dexa. . Denies CP, SOB, DOE, treatment intolerance or new symptoms. Due for phq9 AWV 10/10/17  Obesity/Hyperlipidemia: -wt 8/18 170 -->  Wt 173 (09/2017) --> 165(11/19) -cholesterol was improved last check  Skin lesion arm: -at last visit she opted to see derm for removal -She saw the dermatologist and they opted to leave the lesion alone  Mild depression: -On screening -she felt was from workload at the time, summer 2019 -No severe symptoms  Osteopenia: -Getting regular exercise and taking vitamin D - due for recheck, last DEXA was in 2016 - scheduled for 12/19  ROS: See pertinent positives and negatives per HPI.  Past Medical History:  Diagnosis Date  . Constipation    chronic, improved with healthy diet  . Endocervical polyp   . Hemorrhoids    resolved  . Osteoarthritis, knee 06/27/2015   -[facility]   . Osteopenia 06/27/2015   -reports on medication remotely with gyn     Past Surgical History:  Procedure Laterality Date  . BACK SURGERY    . CESAREAN SECTION     2    Family History  Problem Relation Age of Onset  . Hyperlipidemia Other   . Hypertension Other   . Depression Other   . Cancer Other        colon    SOCIAL HX: see hpi   Current Outpatient Medications:  .  Acetaminophen 500 MG coapsule, Take by mouth., Disp: , Rfl:  .  APPLE CIDER VINEGAR PO, Take by mouth. Takes 2 tsps every morning, Disp: , Rfl:  .  Cholecalciferol (VITAMIN D3 PO), Take 1,000 Units by mouth., Disp: , Rfl:  .  TURMERIC PO, Take  by mouth., Disp: , Rfl:   EXAM:  Vitals:   01/02/18 0904  BP: 118/72  Pulse: 81  Temp: 98 F (36.7 C)    Body mass index is 32.03 kg/m.  GENERAL: vitals reviewed and listed above, alert, oriented, appears well hydrated and in no acute distress  HEENT: atraumatic, conjunttiva clear, no obvious abnormalities on inspection of external nose and ears  NECK: no obvious masses on inspection  LUNGS: clear to auscultation bilaterally, no wheezes, rales or rhonchi, good air movement  CV: HRRR, no peripheral edema  MS: moves all extremities without noticeable abnormality  PSYCH: pleasant and cooperative, no obvious depression or anxiety  ASSESSMENT AND PLAN:  Discussed the following assessment and plan:  Class 1 drug-induced obesity without serious comorbidity with body mass index (BMI) of 32.0 to 32.9 in adult  Hyperlipidemia, unspecified hyperlipidemia type - Plan: Lipid panel  Osteopenia, unspecified location  -lipids per her request -congratulated on changes! Cont healthy lifestyle - recs in handout -dexa ordered -6 month f/u -sooner prn Patient Instructions  BEFORE YOU LEAVE: -phq9 -lab -follow up: 6 months   We recommend the following healthy lifestyle for LIFE: 1) Small portions. But, make sure to get regular (at least 3 per day), healthy meals and small healthy snacks if needed.  2) Eat a healthy clean diet.   TRY TO EAT: -at least 5-7 servings of low sugar, colorful,  and nutrient rich vegetables per day (not corn, potatoes or bananas.) -berries are the best choice if you wish to eat fruit (only eat small amounts if trying to reduce weight)  -lean meets (fish, white meat of chicken or [place]) -vegan proteins for some meals - beans or tofu, whole grains, nuts and seeds -Replace bad fats with good fats - good fats include: fish, nuts and seeds, canola oil, olive oil -small amounts of low fat or non fat dairy -small amounts of100 % whole grains - check the  lables -drink plenty of water  AVOID: -SUGAR, sweets, anything with added sugar, corn syrup or sweeteners - must read labels as even foods advertised as "healthy" often are loaded with sugar -if you must have a sweetener, small amounts of stevia may be best -sweetened beverages and artificially sweetened beverages -simple starches (rice, bread, potatoes, pasta, chips, etc - small amounts of 100% whole grains are ok) -red meat, pork, butter -fried foods, fast food, processed food, excessive dairy, eggs and coconut.  3)Get at least 150 minutes of sweaty aerobic exercise per week.  4)Reduce stress - consider counseling, meditation and relaxation to balance other aspects of your life.     Lucretia Kern, DO

## 2018-01-02 ENCOUNTER — Encounter: Payer: Self-pay | Admitting: Family Medicine

## 2018-01-02 ENCOUNTER — Ambulatory Visit (INDEPENDENT_AMBULATORY_CARE_PROVIDER_SITE_OTHER): Payer: PPO | Admitting: Family Medicine

## 2018-01-02 VITALS — BP 118/72 | HR 81 | Temp 98.0°F | Ht 60.25 in | Wt 165.4 lb

## 2018-01-02 DIAGNOSIS — M8580 Other specified disorders of bone density and structure, unspecified site: Secondary | ICD-10-CM

## 2018-01-02 DIAGNOSIS — E785 Hyperlipidemia, unspecified: Secondary | ICD-10-CM | POA: Diagnosis not present

## 2018-01-02 DIAGNOSIS — Z6832 Body mass index (BMI) 32.0-32.9, adult: Secondary | ICD-10-CM | POA: Diagnosis not present

## 2018-01-02 DIAGNOSIS — E661 Drug-induced obesity: Secondary | ICD-10-CM

## 2018-01-02 LAB — LIPID PANEL
CHOLESTEROL: 184 mg/dL (ref 0–200)
HDL: 70.2 mg/dL (ref >39.00)
LDL CALC: 103 mg/dL — AB (ref 0–99)
NonHDL: 113.96
TRIGLYCERIDES: 57 mg/dL (ref 0.0–149.0)
Total CHOL/HDL Ratio: 3
VLDL: 11.4 mg/dL (ref 0.0–40.0)

## 2018-01-02 NOTE — Patient Instructions (Signed)
BEFORE YOU LEAVE: -phq9 -lab -follow up: 6 months   We recommend the following healthy lifestyle for LIFE: 1) Small portions. But, make sure to get regular (at least 3 per day), healthy meals and small healthy snacks if needed.  2) Eat a healthy clean diet.   TRY TO EAT: -at least 5-7 servings of low sugar, colorful, and nutrient rich vegetables per day (not corn, potatoes or bananas.) -berries are the best choice if you wish to eat fruit (only eat small amounts if trying to reduce weight)  -lean meets (fish, white meat of chicken or [place]) -vegan proteins for some meals - beans or tofu, whole grains, nuts and seeds -Replace bad fats with good fats - good fats include: fish, nuts and seeds, canola oil, olive oil -small amounts of low fat or non fat dairy -small amounts of100 % whole grains - check the lables -drink plenty of water  AVOID: -SUGAR, sweets, anything with added sugar, corn syrup or sweeteners - must read labels as even foods advertised as "healthy" often are loaded with sugar -if you must have a sweetener, small amounts of stevia may be best -sweetened beverages and artificially sweetened beverages -simple starches (rice, bread, potatoes, pasta, chips, etc - small amounts of 100% whole grains are ok) -red meat, pork, butter -fried foods, fast food, processed food, excessive dairy, eggs and coconut.  3)Get at least 150 minutes of sweaty aerobic exercise per week.  4)Reduce stress - consider counseling, meditation and relaxation to balance other aspects of your life.

## 2018-02-03 ENCOUNTER — Ambulatory Visit
Admission: RE | Admit: 2018-02-03 | Discharge: 2018-02-03 | Disposition: A | Discharge status: Home / Self Care | Payer: PPO | Source: Ambulatory Visit | Attending: Family Medicine | Admitting: Family Medicine

## 2018-02-03 DIAGNOSIS — M8588 Other specified disorders of bone density and structure, other site: Secondary | ICD-10-CM | POA: Diagnosis not present

## 2018-02-03 DIAGNOSIS — Z1231 Encounter for screening mammogram for malignant neoplasm of breast: Secondary | ICD-10-CM

## 2018-02-03 DIAGNOSIS — Z780 Asymptomatic menopausal state: Secondary | ICD-10-CM | POA: Diagnosis not present

## 2018-02-03 DIAGNOSIS — M8580 Other specified disorders of bone density and structure, unspecified site: Secondary | ICD-10-CM

## 2018-03-13 ENCOUNTER — Ambulatory Visit: Payer: PPO | Admitting: Family Medicine

## 2018-07-03 ENCOUNTER — Ambulatory Visit: Payer: PPO | Admitting: Family Medicine

## 2018-07-03 NOTE — Progress Notes (Signed)
Virtual Visit via Video Note  I connected with Colleen Coffey  on 07/04/18 at 10:00 AM EDT by a video enabled telemedicine application and verified that I am speaking with the correct person using two identifiers.  Location patient: home Location provider:work office Persons participating in the virtual visit: patient, provider  I discussed the limitations of evaluation and management by telemedicine and the availability of in person appointments. The patient expressed understanding and agreed to proceed.   Colleen Coffey DOB: 09/04/1950 Encounter date: 07/04/2018  This is a 68 y.o. female who presents to establish care. Chief Complaint  Patient presents with  . Establish Care    transfer from Dr Maudie Mercury    History of present illness: No specific concerns today.   Arthritis: Biggest issue with arthritis is her knees. Regular walking is ok, but no jumping and certain exercises like squats bother her. Notes more if going on stairs a lot or walking more. Not seeing specialist now, but has in past.   Osteopenia:scheduled for repeat dexa 01/2018 stable from previous.  Mild depression on screening in past. COVID situation has affected her somewhat. Just trying to be careful with getting out. Trying to avoid crowds, stores. Ordering food online, etc. Just worried a lot in beginning. Sleeping ok; trying not to [employer].  Past Medical History:  Diagnosis Date  . Constipation    chronic, improved with healthy diet  . Endocervical polyp   . Hemorrhoids    resolved  . Osteoarthritis, knee 06/27/2015   -saw [facility]   . Osteopenia 06/27/2015   -reports on medication remotely with gyn    Past Surgical History:  Procedure Laterality Date  . BACK SURGERY  2014   bulging disc lumbar spine  . BREAST CYST ASPIRATION Right   . CESAREAN SECTION     2   No Known Allergies Current Meds  Medication Sig  . Acetaminophen 500 MG coapsule Take by mouth.  . APPLE CIDER  VINEGAR PO Take by mouth. Takes 2 tsps every morning  . Ascorbic Acid (VITAMIN C PO) Take 1,000 mg by mouth daily.  . Cholecalciferol (VITAMIN D3 PO) Take 1,000 Units by mouth.  . TURMERIC PO Take by mouth.  . zinc gluconate 50 MG tablet Take 50 mg by mouth daily.   Social History   Tobacco Use  . Smoking status: Never Smoker  . Smokeless tobacco: Never Used  Substance Use Topics  . Alcohol use: No   Family History  Problem Relation Age of Onset  . Hyperlipidemia Other   . Hypertension Other   . Depression Other   . Cancer Other        colon  . High blood pressure Mother   . High Cholesterol Mother   . Dementia Mother   . Arthritis Mother   . Heart disease Mother   . Syncope episode Mother   . High blood pressure Father   . COPD Father   . High blood pressure Sister   . COPD Brother   . High blood pressure Brother   . High blood pressure Maternal Grandmother   . High Cholesterol Maternal Grandmother   . Dementia Maternal Grandmother   . Arthritis Maternal Grandmother   . Parkinson's disease Sister   . Arthritis Sister      Review of Systems  Constitutional: Negative for chills, fatigue and fever.  Respiratory: Negative for cough, chest tightness, shortness of breath and wheezing.   Cardiovascular: Negative for chest pain, palpitations  and leg swelling.  Psychiatric/Behavioral: Negative for sleep disturbance. Nervous/anxious: improved stress regarding COVID situation.       Objective:  LMP 01/11/2017 (Exact Date)       BP Readings from Last 3 Encounters:  01/02/18 118/72  10/10/17 122/80  07/02/17 120/60   Wt Readings from Last 3 Encounters:  01/02/18 165 lb 6.4 oz (75 kg)  10/10/17 173 lb 3.2 oz (78.6 kg)  07/02/17 168 lb 6.4 oz (76.4 kg)    EXAM:  GENERAL: alert, oriented, appears well and in no acute distress  HEENT: atraumatic, conjunctiva clear, no obvious abnormalities on inspection of external nose and ears  NECK: normal movements of the  head and neck  LUNGS: on inspection no signs of respiratory distress, breathing rate appears normal, no obvious gross SOB, gasping or wheezing  CV: no obvious cyanosis  MS: moves all visible extremities without noticeable abnormality  PSYCH/NEURO: pleasant and cooperative, no obvious depression or anxiety, speech and thought processing grossly intact  SKIN: no obvious facial abnormalities.  Assessment/Plan  1. Osteoarthritis of both knees, unspecified osteoarthritis type Stable; tolerates and not limiting her daily activity.  2. Osteopenia, unspecified location Stable on recenct dexa 01/2018. Would consider 5 year repeat.  3. Constipation, unspecified constipation type Improved with metamucil.   Return AW[facility] september and physical in office either dec/spring.     I discussed the assessment and treatment plan with the patient. The patient was provided an opportunity to ask questions and all were answered. The patient agreed with the plan and demonstrated an understanding of the instructions.   The patient was advised to call back or seek an in-person evaluation if the symptoms worsen or if the condition fails to improve as anticipated.  I provided 15 minutes of non-face-to-face time during this encounter.   Micheline Rough, MD

## 2018-07-04 ENCOUNTER — Ambulatory Visit (INDEPENDENT_AMBULATORY_CARE_PROVIDER_SITE_OTHER): Payer: PPO | Admitting: Family Medicine

## 2018-07-04 ENCOUNTER — Other Ambulatory Visit: Payer: Self-pay

## 2018-07-04 ENCOUNTER — Encounter: Payer: Self-pay | Admitting: Family Medicine

## 2018-07-04 DIAGNOSIS — M8580 Other specified disorders of bone density and structure, unspecified site: Secondary | ICD-10-CM | POA: Diagnosis not present

## 2018-07-04 DIAGNOSIS — K5900 Constipation, unspecified: Secondary | ICD-10-CM

## 2018-07-04 DIAGNOSIS — M170 Bilateral primary osteoarthritis of knee: Secondary | ICD-10-CM

## 2018-10-16 ENCOUNTER — Ambulatory Visit (INDEPENDENT_AMBULATORY_CARE_PROVIDER_SITE_OTHER): Payer: PPO

## 2018-10-16 ENCOUNTER — Other Ambulatory Visit: Payer: Self-pay

## 2018-10-16 DIAGNOSIS — Z23 Encounter for immunization: Secondary | ICD-10-CM

## 2018-11-13 ENCOUNTER — Encounter: Payer: Self-pay | Admitting: Family Medicine

## 2018-11-13 ENCOUNTER — Telehealth: Payer: Self-pay | Admitting: Family Medicine

## 2018-11-13 ENCOUNTER — Ambulatory Visit (INDEPENDENT_AMBULATORY_CARE_PROVIDER_SITE_OTHER): Payer: PPO | Admitting: Family Medicine

## 2018-11-13 ENCOUNTER — Other Ambulatory Visit: Payer: Self-pay

## 2018-11-13 VITALS — BP 144/75 | Wt 167.4 lb

## 2018-11-13 DIAGNOSIS — R739 Hyperglycemia, unspecified: Secondary | ICD-10-CM | POA: Diagnosis not present

## 2018-11-13 DIAGNOSIS — M179 Osteoarthritis of knee, unspecified: Secondary | ICD-10-CM

## 2018-11-13 DIAGNOSIS — Z0000 Encounter for general adult medical examination without abnormal findings: Secondary | ICD-10-CM

## 2018-11-13 DIAGNOSIS — E669 Obesity, unspecified: Secondary | ICD-10-CM

## 2018-11-13 DIAGNOSIS — M1710 Unilateral primary osteoarthritis, unspecified knee: Secondary | ICD-10-CM | POA: Diagnosis not present

## 2018-11-13 DIAGNOSIS — E785 Hyperlipidemia, unspecified: Secondary | ICD-10-CM | POA: Diagnosis not present

## 2018-11-13 DIAGNOSIS — R030 Elevated blood-pressure reading, without diagnosis of hypertension: Secondary | ICD-10-CM

## 2018-11-13 NOTE — Progress Notes (Signed)
Medicare Annual Preventive Care Visit  (initial annual wellness or annual wellness exam)  Virtual Visit via Video Note  I connected with Colleen Coffey  on 07/01/18 at  3:20 PM EDT by a video enabled telemedicine application and verified that I am speaking with the correct person using two identifiers.  Location patient: home Location provider:work or home office Persons participating in the virtual visit: patient, provider  Concerns and/or follow up today:  Colleen Coffey is a very pleasant 68 yo F with a PMH of Obesity, Hyperlipidemia, mild depression and osteopenia. Her weight 8/18 was 170 --> 173lbs in 09/2017 and today she reports a weight of 167 lbs. She has been walking 4 hours per week. Is trying to eat healthy but not as good during the pandemic. Reports mood has been good considering the pandemic. BP is a little up today.Has been up in the past when at the dentist. Multiple family members have hypertension. She has some arthritis in the knees and deals with some stiffness in the morning - better once up and around. She has had injections in the past - only helps short term.  See HM section in Epic for other details of completed HM. See scanned documentation under Media Tab for further documentation HPI, health risk assessment. See Media Tab and Care Teams sections in Epic for other providers.  ROS: negative for report of fevers, unintentional weight loss, vision changes, vision loss, hearing loss or change, chest pain, sob, hemoptysis, melena, hematochezia, hematuria, genital discharge or lesions, falls, bleeding or bruising, loc, thoughts of suicide or self harm, memory loss  1.) Patient-completed health risk assessment  - completed and reviewed, see scanned documentation  2.) Review of Medical History: -PMH, PSH, Family History and current specialty and care providers reviewed and updated and listed below  - see scanned in document in chart and below Patient Care Team: Caren Macadam, MD as PCP - General (Family Medicine)   Past Medical History:  Diagnosis Date  . Constipation    chronic, improved with healthy diet  . Endocervical polyp   . Hemorrhoids    resolved  . Osteoarthritis, knee 06/27/2015   -saw [facility]   . Osteopenia 06/27/2015   -reports on medication remotely with gyn     Past Surgical History:  Procedure Laterality Date  . BACK SURGERY  2014   bulging disc lumbar spine  . BREAST CYST ASPIRATION Right   . CESAREAN SECTION     2    Social History   Socioeconomic History  . Marital status: Widowed    Spouse name: Not on file  . Number of children: Not on file  . Years of education: Not on file  . Highest education level: Not on file  Occupational History  . Not on file  Social Needs  . Financial resource strain: Not on file  . Food insecurity    Worry: Not on file    Inability: Not on file  . Transportation needs    Medical: Not on file    Non-medical: Not on file  Tobacco Use  . Smoking status: Never Smoker  . Smokeless tobacco: Never Used  Substance and Sexual Activity  . Alcohol use: No  . Drug use: No  . Sexual activity: Never    Birth control/protection: Post-menopausal  Lifestyle  . Physical activity    Days per week: Not on file    Minutes per session: Not on file  . Stress: Not on file  Relationships  . Social Herbalist on phone: Not on file    Gets together: Not on file    Attends religious service: Not on file    Active member of club or organization: Not on file    Attends meetings of clubs or organizations: Not on file    Relationship status: Not on file  . Intimate partner violence    Fear of current or ex partner: Not on file    Emotionally abused: Not on file    Physically abused: Not on file    Forced sexual activity: Not on file  Other Topics Concern  . Not on file  Social History Narrative   Work or School: retired, used to be a Librarian, academic for [employer]      Home  Situation: lives alone; as a Engineer, materials; husband and mother passed in 2013      Spiritual Beliefs: Christian      Lifestyle: exercising; eating healthy       Family History  Problem Relation Age of Onset  . Hyperlipidemia Other   . Hypertension Other   . Depression Other   . Cancer Other        colon  . High blood pressure Mother   . High Cholesterol Mother   . Dementia Mother   . Arthritis Mother   . Heart disease Mother   . Syncope episode Mother   . High blood pressure Father   . COPD Father   . High blood pressure Sister   . COPD Brother   . High blood pressure Brother   . High blood pressure Maternal Grandmother   . High Cholesterol Maternal Grandmother   . Dementia Maternal Grandmother   . Arthritis Maternal Grandmother   . Parkinson's disease Sister   . Arthritis Sister     Current Outpatient Medications on File Prior to Visit  Medication Sig Dispense Refill  . Acetaminophen 500 MG coapsule Take by mouth.    . APPLE CIDER VINEGAR PO Take by mouth. Takes 2 tsps every morning    . Ascorbic Acid (VITAMIN C PO) Take 1,000 mg by mouth daily.    . Cholecalciferol (VITAMIN D3 PO) Take 1,000 Units by mouth.    . Multiple Vitamins-Minerals (MULTIVITAMIN ADULT PO) Take by mouth.    . TURMERIC PO Take by mouth.    . zinc gluconate 50 MG tablet Take 50 mg by mouth daily.     No current facility-administered medications on file prior to visit.      3.) Review of functional ability and level of safety:  Any difficulty hearing?  See scanned documentation - none  History of falling?  See scanned documentation - none  Any trouble with IADLs - using a phone, using transportation, grocery shopping, preparing meals, doing housework, doing laundry, taking medications and managing money?  See scanned documentation - none  Advance Directives? Completed per pt, see scanned documentation  See summary of recommendations in Patient Instructions below.  4.) Physical  Exam Vitals:   11/13/18 1007  BP: (!) 144/75   Estimated body mass index is 32.42 kg/m as calculated from the following:   Height as of 01/02/18: 5' 0.25" (1.53 m).   Weight as of this encounter: 167 lb 6.4 oz (75.9 kg).   Body mass index is 32.42 kg/m.   EKG (optional): deferred  VITALS per patient if applicable:  GENERAL: alert, oriented, appears well and in no acute distress; visual acuity grossly intact,  full vision exam deferred due to pandemic and/or virtual encounter  HEENT: atraumatic, conjunttiva clear, no obvious abnormalities on inspection of external nose and ears  NECK: normal movements of the head and neck  LUNGS: on inspection no signs of respiratory distress, breathing rate appears normal, no obvious gross SOB, gasping or wheezing  CV: no obvious cyanosis  MS: moves all visible extremities without noticeable abnormality  PSYCH/NEURO: pleasant and cooperative, no obvious depression or anxiety, speech and thought processing grossly intact, Cognitive function grossly intact  Depression screen [facility] 2/9 11/13/2018 01/02/2018 10/10/2017 10/04/2016 09/22/2015  Decreased Interest 0 0 0 0 0  Down, Depressed, Hopeless 1 0 1 0 0  PHQ - 2 Score 1 0 1 0 0  Altered sleeping - 0 - - -  Tired, decreased energy - 1 - - -  Change in appetite - 0 - - -  Feeling bad or failure about yourself  - 0 - - -  Trouble concentrating - 0 - - -  Moving slowly or fidgety/restless - 0 - - -  Suicidal thoughts - 0 - - -  PHQ-9 Score - 1 - - -     See patient instructions for recommendations.  Education and counseling regarding the above review of health provided with a plan for the following: -see scanned patient completed form for further details -fall prevention strategies discussed  -healthy lifestyle discussed -importance and resources for completing advanced directives discussed -see patient instructions below for any other recommendations provided  4)The following written  screening schedule of preventive measures were reviewed with assessment and plan made per below, orders and patient instructions:           Alcohol screening done     Obesity Screening and counseling done     STI screening (Hep C if born [birth year]) offered and per pt wishes     Tobacco Screening done        Pneumococcal (PPSV23 -one dose after 64, one before if risk factors), influenza yearly and hepatitis B vaccines (if high risk - end stage renal disease, IV drugs, homosexual men, live in home for mentally retarded, hemophilia receiving factors) ASSESSMENT/PLAN: done       Screening mammograph (yearly if >40) ASSESSMENT/PLAN: utd - done 01/2018      Screening Pap smear/pelvic exam (q2 years) ASSESSMENT/PLAN: n/a, declined      Colorectal cancer screening (FOBT yearly or flex sig q4y or colonoscopy q10y or barium enema q4y) ASSESSMENT/PLAN: done 11/2006 (conlonoscopy), cologuard completed 10/2016      Bone mass measurements(covered q2y if indicated - estrogen def, osteoporosis, hyperparathyroid, vertebral abnormalities, osteoporosis or steroids) ASSESSMENT/PLAN: utd - done 01/2018      Screening for glaucoma(q1y if high risk - diabetes, FH, AA and > 50 or hispanic and > 65) ASSESSMENT/PLAN: utd or advised - sees optho on a regular basis, see scanned pt documentation       Cardiovascular screening blood tests (lipids q5y) ASSESSMENT/PLAN: see orders and labs      Diabetes screening tests ASSESSMENT/PLAN: see orders and labs   7.) Summary:   Medicare annual wellness visit, subsequent -risk factors and conditions per above assessment were discussed and treatment, recommendations and referrals were offered per documentation above and orders and patient instructions.  Hyperlipidemia, unspecified hyperlipidemia type - Plan: Lipid panel  Hyperglycemia - Plan: Hemoglobin A1c  Elevated blood pressure reading -discussed goals and treatment may be needed -opted to do home monitoring  w/ 1/3 week follow up, labs in interim,  lifestyle recs  Osteoarthritis: -discussed various options for the management of OA of the knees. She opted for tylenol prn (she only takes one dose/tab per day), topical sports creams, staying active, specialist if worsening. Wt reduction and healthy lifestyle advised.  Obesity: -advised wt reduction and discussed healthy diet at length, summarized below  Patient Instructions    Ms. Behan , Thank you for taking time to come for your Medicare Wellness Visit. I appreciate your ongoing commitment to your health goals. Please review the following plan we discussed and let me know if I can assist you in the future.   These are the goals we discussed: Goals   Continue regular exercise - at least 150 minutes per wee  Healthy low sugar diet, lots of veggies, lean proteins and avoid junk food, carbs (especially white starchy fiids), sweets and processed foods.  Labs - fasting  Monitor blood pressure once daily and follow up in 1-2 weeks to review and determine further treatment.     This is a list of the screening recommended for you and due dates:  Health Maintenance  Topic Date Due  . Tetanus Vaccine  01/05/2019  . Cologuard (Stool DNA test)  10/19/2019  . Mammogram  02/04/2020  . Flu Shot  Completed  . DEXA scan (bone density measurement)  Completed  .  Hepatitis C: One time screening is recommended by Center for Disease Control  (CDC) for  adults born from [birth year] through 1966.   Completed  . Pneumonia vaccines  Completed    Colleen Kern, DO

## 2018-11-13 NOTE — Patient Instructions (Signed)
  Colleen Coffey , Thank you for taking time to come for your Medicare Wellness Visit. I appreciate your ongoing commitment to your health goals. Please review the following plan we discussed and let me know if I can assist you in the future.   These are the goals we discussed: Goals   Continue regular exercise - at least 150 minutes per wee  Healthy low sugar diet, lots of veggies, lean proteins and avoid junk food, carbs (especially white starchy fiids), sweets and processed foods.  Labs - fasting  Monitor blood pressure once daily and follow up in 1-2 weeks to review and determine further treatment.     This is a list of the screening recommended for you and due dates:  Health Maintenance  Topic Date Due  . Tetanus Vaccine  01/05/2019  . Cologuard (Stool DNA test)  10/19/2019  . Mammogram  02/04/2020  . Flu Shot  Completed  . DEXA scan (bone density measurement)  Completed  .  Hepatitis C: One time screening is recommended by Center for Disease Control  (CDC) for  adults born from [birth year] through 1966.   Completed  . Pneumonia vaccines  Completed

## 2018-11-13 NOTE — Telephone Encounter (Signed)
Scheduled appt with pt for fasting labs per Dr Maudie Mercury.

## 2018-11-18 ENCOUNTER — Other Ambulatory Visit: Payer: Self-pay

## 2018-11-18 ENCOUNTER — Other Ambulatory Visit (INDEPENDENT_AMBULATORY_CARE_PROVIDER_SITE_OTHER): Payer: PPO

## 2018-11-18 DIAGNOSIS — R030 Elevated blood-pressure reading, without diagnosis of hypertension: Secondary | ICD-10-CM | POA: Diagnosis not present

## 2018-11-18 DIAGNOSIS — R739 Hyperglycemia, unspecified: Secondary | ICD-10-CM | POA: Diagnosis not present

## 2018-11-18 DIAGNOSIS — E785 Hyperlipidemia, unspecified: Secondary | ICD-10-CM | POA: Diagnosis not present

## 2018-11-18 LAB — BASIC METABOLIC PANEL
BUN: 16 mg/dL (ref 6–23)
CO2: 30 mEq/L (ref 19–32)
Calcium: 9.2 mg/dL (ref 8.4–10.5)
Chloride: 102 mEq/L (ref 96–112)
Creatinine, Ser: 0.69 mg/dL (ref 0.40–1.20)
GFR: 101.97 mL/min (ref >60.00)
Glucose, Bld: 86 mg/dL (ref 70–99)
Potassium: 4 mEq/L (ref 3.5–5.1)
Sodium: 139 mEq/L (ref 135–145)

## 2018-11-18 LAB — LIPID PANEL
Cholesterol: 195 mg/dL (ref 0–200)
HDL: 60.6 mg/dL (ref >39.00)
LDL Cholesterol: 104 mg/dL — ABNORMAL HIGH (ref 0–99)
NonHDL: 134.38
Total CHOL/HDL Ratio: 3
Triglycerides: 154 mg/dL — ABNORMAL HIGH (ref 0.0–149.0)
VLDL: 30.8 mg/dL (ref 0.0–40.0)

## 2018-11-18 LAB — CBC
HCT: 39.5 % (ref 36.0–46.0)
Hemoglobin: 13.1 g/dL (ref 12.0–15.0)
MCHC: 33.1 g/dL (ref 30.0–36.0)
MCV: 89 fl (ref 78.0–100.0)
Platelets: 341 10*3/uL (ref 150.0–400.0)
RBC: 4.44 Mil/uL (ref 3.87–5.11)
RDW: 13.9 % (ref 11.5–15.5)
WBC: 6.6 10*3/uL (ref 4.0–10.5)

## 2018-11-18 LAB — HEMOGLOBIN A1C: Hgb A1c MFr Bld: 5.7 % (ref 4.6–6.5)

## 2018-12-09 ENCOUNTER — Other Ambulatory Visit: Payer: Self-pay | Admitting: Family Medicine

## 2018-12-09 DIAGNOSIS — Z1231 Encounter for screening mammogram for malignant neoplasm of breast: Secondary | ICD-10-CM

## 2019-02-04 ENCOUNTER — Encounter: Payer: PPO | Admitting: Family Medicine

## 2019-02-09 ENCOUNTER — Other Ambulatory Visit: Payer: Self-pay

## 2019-02-09 ENCOUNTER — Ambulatory Visit
Admission: RE | Admit: 2019-02-09 | Discharge: 2019-02-09 | Disposition: A | Discharge status: Home / Self Care | Payer: PPO | Source: Ambulatory Visit | Attending: Family Medicine | Admitting: Family Medicine

## 2019-02-09 DIAGNOSIS — Z1231 Encounter for screening mammogram for malignant neoplasm of breast: Secondary | ICD-10-CM | POA: Diagnosis not present

## 2019-03-14 ENCOUNTER — Ambulatory Visit: Payer: PPO

## 2019-03-22 ENCOUNTER — Ambulatory Visit: Payer: PPO

## 2019-04-04 ENCOUNTER — Ambulatory Visit: Payer: PPO

## 2019-04-07 ENCOUNTER — Other Ambulatory Visit: Payer: Self-pay

## 2019-04-08 ENCOUNTER — Ambulatory Visit (INDEPENDENT_AMBULATORY_CARE_PROVIDER_SITE_OTHER): Payer: PPO | Admitting: Family Medicine

## 2019-04-08 ENCOUNTER — Encounter: Payer: Self-pay | Admitting: Family Medicine

## 2019-04-08 VITALS — BP 150/96 | HR 83 | Temp 97.2°F | Ht 60.0 in | Wt 175.9 lb

## 2019-04-08 DIAGNOSIS — R030 Elevated blood-pressure reading, without diagnosis of hypertension: Secondary | ICD-10-CM

## 2019-04-08 DIAGNOSIS — M170 Bilateral primary osteoarthritis of knee: Secondary | ICD-10-CM

## 2019-04-08 DIAGNOSIS — Z0000 Encounter for general adult medical examination without abnormal findings: Secondary | ICD-10-CM

## 2019-04-08 DIAGNOSIS — M8580 Other specified disorders of bone density and structure, unspecified site: Secondary | ICD-10-CM

## 2019-04-08 DIAGNOSIS — M7061 Trochanteric bursitis, right hip: Secondary | ICD-10-CM

## 2019-04-08 MED ORDER — SHINGRIX 50 MCG/0.5ML IM SUSR: 0.5000 mL | Freq: Once | INTRAMUSCULAR | 0 refills | Status: AC

## 2019-04-08 NOTE — Progress Notes (Signed)
Colleen Coffey DOB: 1950-10-02 Encounter date: 04/08/2019  This is a 69 y.o. female who presents for complete physical   History of present illness/Additional concerns: No specific concerns today.   Pain right side, knees; right side.   Osteopenia:scheduled for repeat dexa 01/2018 stable from previous. Pain radiates up side of leg whenever on feet for awhile. No swelling in joints. [facility] in past. Had injection in knees in the past.   Mood has been ok. Staying away from news.   Cologuard 10/2016; due 10/2019  Past Medical History:  Diagnosis Date  . Constipation    chronic, improved with healthy diet  . Endocervical polyp   . Hemorrhoids    resolved  . Osteoarthritis, knee 06/27/2015   -saw [facility]   . Osteopenia 06/27/2015   -reports on medication remotely with gyn    Past Surgical History:  Procedure Laterality Date  . BACK SURGERY  2014   bulging disc lumbar spine  . BREAST CYST ASPIRATION Right   . CESAREAN SECTION     2   No Known Allergies Current Meds  Medication Sig  . Acetaminophen 500 MG coapsule Take by mouth.  . APPLE CIDER VINEGAR PO Take by mouth. Takes 2 tsps every morning  . Ascorbic Acid (VITAMIN C PO) Take 1,000 mg by mouth daily.  . Cholecalciferol (VITAMIN D3 PO) Take 1,000 Units by mouth.  . Multiple Vitamins-Minerals (MULTIVITAMIN ADULT PO) Take by mouth.  . TURMERIC PO Take by mouth.  . zinc gluconate 50 MG tablet Take 50 mg by mouth daily.   Social History   Tobacco Use  . Smoking status: Never Smoker  . Smokeless tobacco: Never Used  Substance Use Topics  . Alcohol use: No   Family History  Problem Relation Age of Onset  . Hyperlipidemia Other   . Hypertension Other   . Depression Other   . Cancer Other        colon  . High blood pressure Mother   . High Cholesterol Mother   . Dementia Mother   . Arthritis Mother   . Heart disease Mother   . Syncope episode Mother   . High blood pressure Father   . COPD  Father   . High blood pressure Sister   . COPD Brother   . High blood pressure Brother   . High blood pressure Maternal Grandmother   . High Cholesterol Maternal Grandmother   . Dementia Maternal Grandmother   . Arthritis Maternal Grandmother   . Parkinson's disease Sister   . Arthritis Sister      Review of Systems  Constitutional: Negative for activity change, appetite change, chills, fatigue, fever and unexpected weight change.  HENT: Negative for congestion, ear pain, hearing loss, sinus pressure, sinus pain, sore throat and trouble swallowing.   Eyes: Negative for pain and visual disturbance.  Respiratory: Negative for cough, chest tightness, shortness of breath and wheezing.   Cardiovascular: Negative for chest pain, palpitations and leg swelling.  Gastrointestinal: Negative for abdominal pain, blood in stool, constipation, diarrhea, nausea and vomiting.  Genitourinary: Negative for difficulty urinating and menstrual problem.  Musculoskeletal: Negative for arthralgias and back pain.  Skin: Negative for rash.  Neurological: Negative for dizziness, weakness, numbness and headaches.  Hematological: Negative for adenopathy. Does not bruise/bleed easily.  Psychiatric/Behavioral: Negative for sleep disturbance and suicidal ideas. The patient is not nervous/anxious.     CBC:  Lab Results  Component Value Date   WBC 6.6 11/18/2018  HGB 13.1 11/18/2018   HCT 39.5 11/18/2018   MCHC 33.1 11/18/2018   RDW 13.9 11/18/2018   PLT 341.0 11/18/2018   CMP: Lab Results  Component Value Date   NA 139 11/18/2018   K 4.0 11/18/2018   CL 102 11/18/2018   CO2 30 11/18/2018   GLUCOSE 86 11/18/2018   BUN 16 11/18/2018   CREATININE 0.69 11/18/2018   GFRAA 95 12/25/2007   CALCIUM 9.2 11/18/2018   PROT 7.1 02/01/2014   BILITOT 0.6 02/01/2014   ALKPHOS 64 02/01/2014   ALT 15 02/01/2014   AST 20 02/01/2014   LIPID: Lab Results  Component Value Date   CHOL 195 11/18/2018   TRIG  154.0 (H) 11/18/2018   HDL 60.60 11/18/2018   LDLCALC 104 (H) 11/18/2018    Objective:  BP (!) 150/96 (BP Location: Right Arm, Patient Position: Sitting, Cuff Size: Large)   Pulse 83   Temp (!) 97.2 F (36.2 C) (Temporal)   Ht 5' (1.524 m)   Wt 175 lb 14.4 oz (79.8 kg)   LMP 01/11/2017 (Exact Date)   SpO2 97%   BMI 34.35 kg/m   Weight: 175 lb 14.4 oz (79.8 kg)   BP Readings from Last 3 Encounters:  04/08/19 (!) 150/96  11/13/18 (!) 144/75  01/02/18 118/72   Wt Readings from Last 3 Encounters:  04/08/19 175 lb 14.4 oz (79.8 kg)  11/13/18 167 lb 6.4 oz (75.9 kg)  01/02/18 165 lb 6.4 oz (75 kg)    Physical Exam Constitutional:      General: She is not in acute distress.    Appearance: She is well-developed.  HENT:     Head: Normocephalic and atraumatic.     Right Ear: External ear normal.     Left Ear: External ear normal.     Mouth/Throat:     Pharynx: No oropharyngeal exudate.  Eyes:     Conjunctiva/sclera: Conjunctivae normal.     Pupils: Pupils are equal, round, and reactive to light.  Neck:     Thyroid: No thyromegaly.  Cardiovascular:     Rate and Rhythm: Normal rate and regular rhythm.     Heart sounds: Normal heart sounds. No murmur. No friction rub. No gallop.   Pulmonary:     Effort: Pulmonary effort is normal.     Breath sounds: Normal breath sounds.  Abdominal:     General: Bowel sounds are normal. There is no distension.     Palpations: Abdomen is soft. There is no mass.     Tenderness: There is no abdominal tenderness. There is no guarding.     Hernia: No hernia is present.  Musculoskeletal:        General: No tenderness or deformity. Normal range of motion.     Cervical back: Normal range of motion and neck supple.     Comments: Lateral thigh pain/SI pain with hip flexion and rotation. Internal and external hip rotation not limited. Trochanteric tenderness. IT band tenderness.  Lymphadenopathy:     Cervical: No cervical adenopathy.  Skin:     General: Skin is warm and dry.     Findings: No rash.  Neurological:     Mental Status: She is alert and oriented to person, place, and time.     Deep Tendon Reflexes: Reflexes normal.     Reflex Scores:      Tricep reflexes are 2+ on the right side and 2+ on the left side.      Bicep reflexes are 2+  on the right side and 2+ on the left side.      Brachioradialis reflexes are 2+ on the right side and 2+ on the left side.      Patellar reflexes are 2+ on the right side and 2+ on the left side. Psychiatric:        Speech: Speech normal.        Behavior: Behavior normal.        Thought Content: Thought content normal.     Assessment/Plan: Health Maintenance Due  Topic Date Due  . TETANUS/TDAP  01/05/2019   Health Maintenance reviewed.  1. Preventative health care Discussed importance of regular activity.  Discussed importance of healthy eating.  She is up-to-date with preventative health measures, although will be due for repeat Cologuard in September and a tetanus shot in the fall.  Encouraged her to get the Tdap since she has not had this with previous doses and she does have a new grandchild.  2. Osteoarthritis of both knees, unspecified osteoarthritis type She has seen Ortho in the past.  She did not get much improvement with injections.  Discussed working on quad strengthening to help give support to the knee.  We also discussed working on stretching to help with hamstring, IT band, bursitis.  Exercises discussed and demonstrated in the office today.  If not getting improvement with stretching and exercising, especially with the right trochanteric bursa, encouraged her to follow-up for injection.  3. Osteopenia, unspecified location Follows with gynecology for this.  DEXA is up-to-date.  Encouraged weightbearing exercise.  4. Trochanteric bursitis of right hip See above.  5.  Elevated blood pressure: Encouraged her to check blood pressures at home on a regular basis.  Report  back to me in 1 to 2 weeks.   Return for blood pressure report in 1-2 weeks.  Micheline Rough, MD

## 2019-04-08 NOTE — Patient Instructions (Addendum)
Consider Tdap; shingles at pharmacy   Iliotibial Band Syndrome Rehab Ask your health care provider which exercises are safe for you. Do exercises exactly as told by your health care provider and adjust them as directed. It is normal to feel mild stretching, pulling, tightness, or discomfort as you do these exercises. Stop right away if you feel sudden pain or your pain gets significantly worse. Do not begin these exercises until told by your health care provider. Stretching and range-of-motion exercises These exercises warm up your muscles and joints and improve the movement and flexibility of your hip and pelvis. Quadriceps stretch, prone  1. Lie on your abdomen on a firm surface, such as a bed or padded floor (prone position). 2. Bend your left / right knee and reach back to hold your ankle or pant leg. If you cannot reach your ankle or pant leg, loop a belt around your foot and grab the belt instead. 3. Gently pull your heel toward your buttocks. Your knee should not slide out to the side. You should feel a stretch in the front of your thigh and knee (quadriceps). 4. Hold this position for __________ seconds. Repeat __________ times. Complete this exercise __________ times a day. Iliotibial band stretch An iliotibial band is a strong band of muscle tissue that runs from the outer side of your hip to the outer side of your thigh and knee. 1. Lie on your side with your left / right leg in the top position. 2. Bend both of your knees and grab your left / right ankle. Stretch out your bottom arm to help you balance. 3. Slowly bring your top knee back so your thigh goes behind your trunk. 4. Slowly lower your top leg toward the floor until you feel a gentle stretch on the outside of your left / right hip and thigh. If you do not feel a stretch and your knee will not fall farther, place the heel of your other foot on top of your knee and pull your knee down toward the floor with your foot. 5. Hold  this position for __________ seconds. Repeat __________ times. Complete this exercise __________ times a day. Strengthening exercises These exercises build strength and endurance in your hip and pelvis. Endurance is the ability to use your muscles for a long time, even after they get tired. Straight leg raises, side-lying This exercise strengthens the muscles that rotate the leg at the hip and move it away from your body (hip abductors). 1. Lie on your side with your left / right leg in the top position. Lie so your head, shoulder, hip, and knee line up. You may bend your bottom knee to help you balance. 2. Roll your hips slightly forward so your hips are stacked directly over each other and your left / right knee is facing forward. 3. Tense the muscles in your outer thigh and lift your top leg 4-6 inches (10-15 cm). 4. Hold this position for __________ seconds. 5. Slowly return to the starting position. Let your muscles relax completely before doing another repetition. Repeat __________ times. Complete this exercise __________ times a day. Leg raises, prone This exercise strengthens the muscles that move the hips (hip extensors). 1. Lie on your abdomen on your bed or a firm surface. You can put a pillow under your hips if that is more comfortable for your lower back. 2. Bend your left / right knee so your foot is straight up in the air. 3. Squeeze your buttocks muscles  and lift your left / right thigh off the bed. Do not let your back arch. 4. Tense your thigh muscle as hard as you can without increasing any knee pain. 5. Hold this position for __________ seconds. 6. Slowly lower your leg to the starting position and allow it to relax completely. Repeat __________ times. Complete this exercise __________ times a day. Hip hike 1. Stand sideways on a bottom step. Stand on your left / right leg with your other foot unsupported next to the step. You can hold on to the railing or wall for balance  if needed. 2. Keep your knees straight and your torso square. Then lift your left / right hip up toward the ceiling. 3. Slowly let your left / right hip lower toward the floor, past the starting position. Your foot should get closer to the floor. Do not lean or bend your knees. Repeat __________ times. Complete this exercise __________ times a day. This information is not intended to replace advice given to you by your health care provider. Make sure you discuss any questions you have with your health care provider. Document Revised: 05/22/2018 Document Reviewed: 11/20/2017 Elsevier Patient Education  2020 [place] for Nurse Practitioners, 15(4), [phone]. Retrieved November 18, 2017 from http://clinicalkey.com/nursing">  Knee Exercises Ask your health care provider which exercises are safe for you. Do exercises exactly as told by your health care provider and adjust them as directed. It is normal to feel mild stretching, pulling, tightness, or discomfort as you do these exercises. Stop right away if you feel sudden pain or your pain gets worse. Do not begin these exercises until told by your health care provider. Stretching and range-of-motion exercises These exercises warm up your muscles and joints and improve the movement and flexibility of your knee. These exercises also help to relieve pain and swelling. Knee extension, prone 5. Lie on your abdomen (prone position) on a bed. 6. Place your left / right knee just beyond the edge of the surface so your knee is not on the bed. You can put a towel under your left / right thigh just above your kneecap for comfort. 7. Relax your leg muscles and allow gravity to straighten your knee (extension). You should feel a stretch behind your left / right knee. 8. Hold this position for __________ seconds. 9. Scoot up so your knee is supported between repetitions. Repeat __________ times. Complete this exercise __________ times a day. Knee  flexion, active  6. Lie on your back with both legs straight. If this causes back discomfort, bend your left / right knee so your foot is flat on the floor. 7. Slowly slide your left / right heel back toward your buttocks. Stop when you feel a gentle stretch in the front of your knee or thigh (flexion). 8. Hold this position for __________ seconds. 9. Slowly slide your left / right heel back to the starting position. Repeat __________ times. Complete this exercise __________ times a day. Quadriceps stretch, prone  6. Lie on your abdomen on a firm surface, such as a bed or padded floor. 7. Bend your left / right knee and hold your ankle. If you cannot reach your ankle or pant leg, loop a belt around your foot and grab the belt instead. 8. Gently pull your heel toward your buttocks. Your knee should not slide out to the side. You should feel a stretch in the front of your thigh and knee (quadriceps). 9. Hold this position for __________  seconds. Repeat __________ times. Complete this exercise __________ times a day. Hamstring, supine 7. Lie on your back (supine position). 8. Loop a belt or towel over the ball of your left / right foot. The ball of your foot is on the walking surface, right under your toes. 9. Straighten your left / right knee and slowly pull on the belt to raise your leg until you feel a gentle stretch behind your knee (hamstring). ? Do not let your knee bend while you do this. ? Keep your other leg flat on the floor. 10. Hold this position for __________ seconds. Repeat __________ times. Complete this exercise __________ times a day. Strengthening exercises These exercises build strength and endurance in your knee. Endurance is the ability to use your muscles for a long time, even after they get tired. Quadriceps, isometric This exercise stretches the muscles in front of your thigh (quadriceps) without moving your knee joint (isometric). 4. Lie on your back with your left /  right leg extended and your other knee bent. Put a rolled towel or small pillow under your knee if told by your health care provider. 5. Slowly tense the muscles in the front of your left / right thigh. You should see your kneecap slide up toward your hip or see increased dimpling just above the knee. This motion will push the back of the knee toward the floor. 6. For __________ seconds, hold the muscle as tight as you can without increasing your pain. 7. Relax the muscles slowly and completely. Repeat __________ times. Complete this exercise __________ times a day. Straight leg raises This exercise stretches the muscles in front of your thigh (quadriceps) and the muscles that move your hips (hip flexors). 1. Lie on your back with your left / right leg extended and your other knee bent. 2. Tense the muscles in the front of your left / right thigh. You should see your kneecap slide up or see increased dimpling just above the knee. Your thigh may even shake a bit. 3. Keep these muscles tight as you raise your leg 4-6 inches (10-15 cm) off the floor. Do not let your knee bend. 4. Hold this position for __________ seconds. 5. Keep these muscles tense as you lower your leg. 6. Relax your muscles slowly and completely after each repetition. Repeat __________ times. Complete this exercise __________ times a day. Hamstring, isometric 1. Lie on your back on a firm surface. 2. Bend your left / right knee about __________ degrees. 3. Dig your left / right heel into the surface as if you are trying to pull it toward your buttocks. Tighten the muscles in the back of your thighs (hamstring) to "dig" as hard as you can without increasing any pain. 4. Hold this position for __________ seconds. 5. Release the tension gradually and allow your muscles to relax completely for __________ seconds after each repetition. Repeat __________ times. Complete this exercise __________ times a day. Hamstring curls If told by  your health care provider, do this exercise while wearing ankle weights. Begin with __________ lb weights. Then increase the weight by 1 lb (0.5 kg) increments. Do not wear ankle weights that are more than __________ lb. 1. Lie on your abdomen with your legs straight. 2. Bend your left / right knee as far as you can without feeling pain. Keep your hips flat against the floor. 3. Hold this position for __________ seconds. 4. Slowly lower your leg to the starting position. Repeat __________ times. Complete this  exercise __________ times a day. Squats This exercise strengthens the muscles in front of your thigh and knee (quadriceps). 1. Stand in front of a table, with your feet and knees pointing straight ahead. You may rest your hands on the table for balance but not for support. 2. Slowly bend your knees and lower your hips like you are going to sit in a chair. ? Keep your weight over your heels, not over your toes. ? Keep your lower legs upright so they are parallel with the table legs. ? Do not let your hips go lower than your knees. ? Do not bend lower than told by your health care provider. ? If your knee pain increases, do not bend as low. 3. Hold the squat position for __________ seconds. 4. Slowly push with your legs to return to standing. Do not use your hands to pull yourself to standing. Repeat __________ times. Complete this exercise __________ times a day. Wall slides This exercise strengthens the muscles in front of your thigh and knee (quadriceps). 1. Lean your back against a smooth wall or door, and walk your feet out 18-24 inches (46-61 cm) from it. 2. Place your feet hip-width apart. 3. Slowly slide down the wall or door until your knees bend __________ degrees. Keep your knees over your heels, not over your toes. Keep your knees in line with your hips. 4. Hold this position for __________ seconds. Repeat __________ times. Complete this exercise __________ times a  day. Straight leg raises This exercise strengthens the muscles that rotate the leg at the hip and move it away from your body (hip abductors). 1. Lie on your side with your left / right leg in the top position. Lie so your head, shoulder, knee, and hip line up. You may bend your bottom knee to help you keep your balance. 2. Roll your hips slightly forward so your hips are stacked directly over each other and your left / right knee is facing forward. 3. Leading with your heel, lift your top leg 4-6 inches (10-15 cm). You should feel the muscles in your outer hip lifting. ? Do not let your foot drift forward. ? Do not let your knee roll toward the ceiling. 4. Hold this position for __________ seconds. 5. Slowly return your leg to the starting position. 6. Let your muscles relax completely after each repetition. Repeat __________ times. Complete this exercise __________ times a day. Straight leg raises This exercise stretches the muscles that move your hips away from the front of the pelvis (hip extensors). 1. Lie on your abdomen on a firm surface. You can put a pillow under your hips if that is more comfortable. 2. Tense the muscles in your buttocks and lift your left / right leg about 4-6 inches (10-15 cm). Keep your knee straight as you lift your leg. 3. Hold this position for __________ seconds. 4. Slowly lower your leg to the starting position. 5. Let your leg relax completely after each repetition. Repeat __________ times. Complete this exercise __________ times a day. This information is not intended to replace advice given to you by your health care provider. Make sure you discuss any questions you have with your health care provider. Document Revised: 11/19/2017 Document Reviewed: 11/19/2017 Elsevier Patient Education  [place] DASH stands for "Dietary Approaches to Stop Hypertension." The DASH eating plan is a healthy eating plan that has been shown to  reduce high blood pressure (hypertension). It may also reduce  your risk for type 2 diabetes, heart disease, and stroke. The DASH eating plan may also help with weight loss. What are tips for following this plan?  General guidelines  Avoid eating more than 2,300 mg (milligrams) of salt (sodium) a day. If you have hypertension, you may need to reduce your sodium intake to 1,500 mg a day.  Limit alcohol intake to no more than 1 drink a day for nonpregnant women and 2 drinks a day for men. One drink equals 12 oz of beer, 5 oz of wine, or 1 oz of hard liquor.  Work with your health care provider to maintain a healthy body weight or to lose weight. Ask what an ideal weight is for you.  Get at least 30 minutes of exercise that causes your heart to beat faster (aerobic exercise) most days of the week. Activities may include walking, swimming, or biking.  Work with your health care provider or diet and nutrition specialist (dietitian) to adjust your eating plan to your individual calorie needs. Reading food labels   Check food labels for the amount of sodium per serving. Choose foods with less than 5 percent of the Daily Value of sodium. Generally, foods with less than 300 mg of sodium per serving fit into this eating plan.  To find whole grains, look for the word "whole" as the first word in the ingredient list. Shopping  Buy products labeled as "low-sodium" or "no salt added."  Buy fresh foods. Avoid canned foods and premade or frozen meals. Cooking  Avoid adding salt when cooking. Use salt-free seasonings or herbs instead of table salt or sea salt. Check with your health care provider or pharmacist before using salt substitutes.  Do not fry foods. Cook foods using healthy methods such as baking, boiling, grilling, and broiling instead.  Cook with heart-healthy oils, such as olive, canola, soybean, or sunflower oil. Meal planning  Eat a balanced diet that includes: ? 5 or more servings  of fruits and vegetables each day. At each meal, try to fill half of your plate with fruits and vegetables. ? Up to 6-8 servings of whole grains each day. ? Less than 6 oz of lean meat, poultry, or fish each day. A 3-oz serving of meat is about the same size as a deck of cards. One egg equals 1 oz. ? 2 servings of low-fat dairy each day. ? A serving of nuts, seeds, or beans 5 times each week. ? Heart-healthy fats. Healthy fats called Omega-3 fatty acids are found in foods such as flaxseeds and coldwater fish, like sardines, salmon, and mackerel.  Limit how much you eat of the following: ? Canned or prepackaged foods. ? Food that is high in trans fat, such as fried foods. ? Food that is high in saturated fat, such as fatty meat. ? Sweets, desserts, sugary drinks, and other foods with added sugar. ? Full-fat dairy products.  Do not salt foods before eating.  Try to eat at least 2 vegetarian meals each week.  Eat more home-cooked food and less restaurant, buffet, and fast food.  When eating at a restaurant, ask that your food be prepared with less salt or no salt, if possible. What foods are recommended? The items listed may not be a complete list. Talk with your dietitian about what dietary choices are best for you. Grains Whole-grain or whole-wheat bread. Whole-grain or whole-wheat pasta. Brown rice. [place]. Bulgur. Whole-grain and low-sodium cereals. Pita bread. Low-fat, low-sodium crackers. Whole-wheat flour tortillas. Vegetables  Fresh or frozen vegetables (raw, steamed, roasted, or grilled). Low-sodium or reduced-sodium tomato and vegetable juice. Low-sodium or reduced-sodium tomato sauce and tomato paste. Low-sodium or reduced-sodium canned vegetables. Fruits All fresh, dried, or frozen fruit. Canned fruit in natural juice (without added sugar). Meat and other protein foods Skinless chicken or [place]. Ground chicken or [place]. Pork with fat trimmed off. Fish and seafood. Egg  whites. Dried beans, peas, or lentils. Unsalted nuts, nut butters, and seeds. Unsalted canned beans. Lean cuts of beef with fat trimmed off. Low-sodium, lean deli meat. Dairy Low-fat (1%) or fat-free (skim) milk. Fat-free, low-fat, or reduced-fat cheeses. Nonfat, low-sodium ricotta or cottage cheese. Low-fat or nonfat yogurt. Low-fat, low-sodium cheese. Fats and oils Soft margarine without trans fats. Vegetable oil. Low-fat, reduced-fat, or light mayonnaise and salad dressings (reduced-sodium). Canola, safflower, olive, soybean, and sunflower oils. Avocado. Seasoning and other foods Herbs. Spices. Seasoning mixes without salt. Unsalted popcorn and pretzels. Fat-free sweets. What foods are not recommended? The items listed may not be a complete list. Talk with your dietitian about what dietary choices are best for you. Grains Baked goods made with fat, such as croissants, muffins, or some breads. Dry pasta or rice meal packs. Vegetables Creamed or fried vegetables. Vegetables in a cheese sauce. Regular canned vegetables (not low-sodium or reduced-sodium). Regular canned tomato sauce and paste (not low-sodium or reduced-sodium). Regular tomato and vegetable juice (not low-sodium or reduced-sodium). Angie Fava. Olives. Fruits Canned fruit in a light or heavy syrup. Fried fruit. Fruit in cream or butter sauce. Meat and other protein foods Fatty cuts of meat. Ribs. Fried meat. Berniece Salines. Sausage. Bologna and other processed lunch meats. Salami. Fatback. Hotdogs. Bratwurst. Salted nuts and seeds. Canned beans with added salt. Canned or smoked fish. Whole eggs or egg yolks. Chicken or [place] with skin. Dairy Whole or 2% milk, cream, and half-and-half. Whole or full-fat cream cheese. Whole-fat or sweetened yogurt. Full-fat cheese. Nondairy creamers. Whipped toppings. Processed cheese and cheese spreads. Fats and oils Butter. Stick margarine. Lard. Shortening. Ghee. Bacon fat. Tropical oils, such as coconut,  palm kernel, or palm oil. Seasoning and other foods Salted popcorn and pretzels. Onion salt, garlic salt, seasoned salt, table salt, and sea salt. Worcestershire sauce. Tartar sauce. Barbecue sauce. Teriyaki sauce. Soy sauce, including reduced-sodium. Steak sauce. Canned and packaged gravies. Fish sauce. Oyster sauce. Cocktail sauce. Horseradish that you find on the shelf. Ketchup. Mustard. Meat flavorings and tenderizers. Bouillon cubes. Hot sauce and Tabasco sauce. Premade or packaged marinades. Premade or packaged taco seasonings. Relishes. Regular salad dressings. Where to find more information:  National Heart, Lung, and [place]: https://wilson-eaton.com/  American Heart Association: www.heart.org Summary  The DASH eating plan is a healthy eating plan that has been shown to reduce high blood pressure (hypertension). It may also reduce your risk for type 2 diabetes, heart disease, and stroke.  With the DASH eating plan, you should limit salt (sodium) intake to 2,300 mg a day. If you have hypertension, you may need to reduce your sodium intake to 1,500 mg a day.  When on the DASH eating plan, aim to eat more fresh fruits and vegetables, whole grains, lean proteins, low-fat dairy, and heart-healthy fats.  Work with your health care provider or diet and nutrition specialist (dietitian) to adjust your eating plan to your individual calorie needs. This information is not intended to replace advice given to you by your health care provider. Make sure you discuss any questions you have with your health care provider. Document Revised:  01/11/2017 Document Reviewed: 01/23/2016 Elsevier Patient Education  [place].

## 2019-04-27 ENCOUNTER — Telehealth: Payer: Self-pay | Admitting: Family Medicine

## 2019-04-27 NOTE — Telephone Encounter (Signed)
I spoke with the pt and she stated BP readings are as below: 2/28-AM 134/71--PM 149/88 3/1-AM 146/82--PM 149/82 3/2-AM 145/80 3/3-133/75 3/4-127/74 3/5-AM 136/71--PM 135/79 3/6-AM 137/76--PM 147/78 3/7-AM 136/69--PM 160/81 3/8-AM 138/68--PM 138/72 3/10-AM 145/76--PM 149/79 3/11-AM 122/72 3/12-AM 138/69 3/13-AM 140/69--PM 146/76 3/14-122/62  Message sent to PCP.

## 2019-04-27 NOTE — Telephone Encounter (Signed)
Pt stated she would like a call back from Wendie Simmer regarding about her BP. Pt states that is high most of the time.   I've ask pt what her readings are but pt would like to speak to Wendie Simmer.   Pt can be reached at [phone]

## 2019-04-28 NOTE — Telephone Encounter (Signed)
Thank you for reporting these back to me!  *So she does have some that are definitely above goal, but I want to ask her about motivation with healthy eating and weight loss. I see that with last visit in fall with Dr. Maudie Mercury she had lost some weight. Some people are very weight sensitive with blood pressure, so if she is motivated to work on low salt diet and weight loss, I feel we can monitor blood pressures for another month and have her report back again. If she is not hesitant to start medication, we could try a very low dose of medication (I think at bedtime would work well) because I do not love those evening pressures up in the [id number] systolic. And then certainly if she were to lose weight and we saw that blood pressure was decreasing we could back off again.   So just let me know what she is comfortable with. Happy to talk with her if desired. Dr. Maudie Mercury could also do virtual visit in follow up if she felt she needed more discussion.

## 2019-04-29 NOTE — Telephone Encounter (Signed)
Spoke with the pt and informed her of the message below.  Patient agreed to work on diet and exercise as she stated she does not want to start medication.  Agreed to check BP and a follow up appt was scheduled for 4/14.

## 2019-05-07 ENCOUNTER — Encounter: Payer: Self-pay | Admitting: Internal Medicine

## 2019-05-07 ENCOUNTER — Ambulatory Visit (INDEPENDENT_AMBULATORY_CARE_PROVIDER_SITE_OTHER): Payer: PPO | Admitting: Internal Medicine

## 2019-05-07 ENCOUNTER — Other Ambulatory Visit: Payer: Self-pay

## 2019-05-07 VITALS — BP 150/80 | HR 72 | Temp 97.4°F | Wt 171.0 lb

## 2019-05-07 DIAGNOSIS — M25511 Pain in right shoulder: Secondary | ICD-10-CM | POA: Diagnosis not present

## 2019-05-07 DIAGNOSIS — M542 Cervicalgia: Secondary | ICD-10-CM

## 2019-05-07 NOTE — Progress Notes (Signed)
Established Patient Office Visit     This visit occurred during the SARS-CoV-2 public health emergency.  Safety protocols were in place, including screening questions prior to the visit, additional usage of staff PPE, and extensive cleaning of exam room while observing appropriate contact time as indicated for disinfecting solutions.    CC/Reason for Visit: Acute right-sided shoulder and neck pain  HPI: Colleen Coffey is a 69 y.o. female who is coming in today for the above mentioned reasons.  For 2 weeks she has been having this pain.  For the past year she has been doing a lot of work from home from her computer and her tablet.  She admits her posture is bad.  She has tried Tylenol without relief.  She does not have numbness or tingling down her arms or legs.  Past Medical/Surgical History: Past Medical History:  Diagnosis Date  . Constipation    chronic, improved with healthy diet  . Endocervical polyp   . Hemorrhoids    resolved  . Osteoarthritis, knee 06/27/2015   -saw [facility]   . Osteopenia 06/27/2015   -reports on medication remotely with gyn     Past Surgical History:  Procedure Laterality Date  . BACK SURGERY  2014   bulging disc lumbar spine  . BREAST CYST ASPIRATION Right   . CESAREAN SECTION     2    Social History:  reports that she has never smoked. She has never used smokeless tobacco. She reports that she does not drink alcohol or use drugs.  Allergies: No Known Allergies  Family History:  Family History  Problem Relation Age of Onset  . Hyperlipidemia Other   . Hypertension Other   . Depression Other   . Cancer Other        colon  . High blood pressure Mother   . High Cholesterol Mother   . Dementia Mother   . Arthritis Mother   . Heart disease Mother   . Syncope episode Mother   . High blood pressure Father   . COPD Father   . High blood pressure Sister   . COPD Brother   . High blood pressure Brother   . High blood  pressure Maternal Grandmother   . High Cholesterol Maternal Grandmother   . Dementia Maternal Grandmother   . Arthritis Maternal Grandmother   . Parkinson's disease Sister   . Arthritis Sister      Current Outpatient Medications:  .  Acetaminophen 500 MG coapsule, Take by mouth., Disp: , Rfl:  .  APPLE CIDER VINEGAR PO, Take by mouth. Takes 2 tsps every morning, Disp: , Rfl:  .  Ascorbic Acid (VITAMIN C PO), Take 1,000 mg by mouth daily., Disp: , Rfl:  .  Cholecalciferol (VITAMIN D3 PO), Take 1,000 Units by mouth., Disp: , Rfl:  .  Multiple Vitamins-Minerals (MULTIVITAMIN ADULT PO), Take by mouth., Disp: , Rfl:  .  TURMERIC PO, Take by mouth., Disp: , Rfl:  .  zinc gluconate 50 MG tablet, Take 50 mg by mouth daily., Disp: , Rfl:   Review of Systems:  Constitutional: Denies fever, chills, diaphoresis, appetite change and fatigue.  HEENT: Denies photophobia, eye pain, redness, hearing loss, ear pain, congestion, sore throat, rhinorrhea, sneezing, mouth sores, trouble swallowing, neck pain, neck stiffness and tinnitus.   Respiratory: Denies SOB, DOE, cough, chest tightness,  and wheezing.   Cardiovascular: Denies chest pain, palpitations and leg swelling.  Gastrointestinal: Denies nausea, vomiting, abdominal pain,  diarrhea, constipation, blood in stool and abdominal distention.  Genitourinary: Denies dysuria, urgency, frequency, hematuria, flank pain and difficulty urinating.  Endocrine: Denies: hot or cold intolerance, sweats, changes in hair or nails, polyuria, polydipsia. Musculoskeletal: Denies back pain, joint swelling, arthralgias and gait problem.  Skin: Denies pallor, rash and wound.  Neurological: Denies dizziness, seizures, syncope, weakness, light-headedness, numbness and headaches.  Hematological: Denies adenopathy. Easy bruising, personal or family bleeding history  Psychiatric/Behavioral: Denies suicidal ideation, mood changes, confusion, nervousness, sleep disturbance and  agitation    Physical Exam: Vitals:   05/07/19 0822  BP: (!) 150/80  Pulse: 72  Temp: (!) 97.4 F (36.3 C)  TempSrc: Temporal  SpO2: 97%  Weight: 171 lb (77.6 kg)    Body mass index is 33.4 kg/m.   Constitutional: NAD, calm, comfortable Eyes: PERRL, lids and conjunctivae normal, wears corrective lenses ENMT: Mucous membranes are moist.  Musculoskeletal: no clubbing / cyanosis. No joint deformity upper and lower extremities.  Significant pain to palpation of her right trapezius muscle Neurologic: Grossly intact and nonfocal Psychiatric: Normal judgment and insight. Alert and oriented x 3. Normal mood.    Impression and Plan:  Neck pain on right side Acute pain of right shoulder -Suspect this is muscular in origin, related to bad posture with electronic use. -Have advised ibuprofen, icing, local massage therapy,, weighted shoulder pad. -Return to clinic in 10 to 14 days if no improvement.    Patient Instructions  -Nice seeing you today!!  -Ibuprofen 2 tablets twice daily as needed for pain.  -Icing for 15 mins twice a day to affected area.  -Local massage therapy.  -Come back in 10-14 days if no better.     Lelon Frohlich, MD [facility]

## 2019-05-07 NOTE — Patient Instructions (Signed)
-  Nice seeing you today!!  -Ibuprofen 2 tablets twice daily as needed for pain.  -Icing for 15 mins twice a day to affected area.  -Local massage therapy.  -Come back in 10-14 days if no better.

## 2019-06-03 ENCOUNTER — Telehealth (INDEPENDENT_AMBULATORY_CARE_PROVIDER_SITE_OTHER): Payer: PPO | Admitting: Family Medicine

## 2019-06-03 ENCOUNTER — Encounter: Payer: Self-pay | Admitting: Family Medicine

## 2019-06-03 ENCOUNTER — Telehealth: Payer: Self-pay | Admitting: Family Medicine

## 2019-06-03 VITALS — BP 133/71 | HR 79 | Wt 164.0 lb

## 2019-06-03 DIAGNOSIS — K5900 Constipation, unspecified: Secondary | ICD-10-CM

## 2019-06-03 MED ORDER — POLYETHYLENE GLYCOL 3350 17 GM/SCOOP PO POWD: 1.0000 | Freq: Once | ORAL | 5 refills | Status: AC

## 2019-06-03 MED ORDER — AMLODIPINE BESYLATE 2.5 MG PO TABS: 2.5000 mg | ORAL_TABLET | Freq: Every day | ORAL | 1 refills | Status: DC

## 2019-06-03 NOTE — Progress Notes (Addendum)
Virtual Visit via Video Note  I connected with Colleen Coffey  on 06/03/19 at  1:30 PM EDT by a video enabled telemedicine application and verified that I am speaking with the correct person using two identifiers.  Location patient: home Location provider:work or home office Persons participating in the virtual visit: patient, provider  I discussed the limitations of evaluation and management by telemedicine and the availability of in person appointments. The patient expressed understanding and agreed to proceed.   Colleen Coffey DOB: [birth year] Encounter date: 06/03/2019  This is a 69 y.o. female who presents with Chief Complaint  Patient presents with  . Follow-up  . Abdominal Pain    x2-3 weeks, used an enema every 2-3 days and Aleve with some relief, states "everything she eats bothers her"  . Constipation    History of present illness: Was having pain up in right side of neck into right shoulder blade. Much better than it was. Feels like that pain moved to stomach now.   Ever since first Coleman shot has not been able to tolerate fruit on stomach. Now she feels like everything she eats is causing problems. Seems to have a lot of gas and only way to get relief is to do an enema. Yesterday started before she went to bed. Took 2 aleve - had pain up into sternum.  Had chicken salad today and that has been ok. Stomach just keeps hurting. Has tried laxatives, antacids and really enema is the only thing that gives her some relief.   Has very little BM without enema. Has urge, but feels she cannot empty her bowels. Taking laxative makes her feel better but then feels like cycle starts all over again.   Took senna over the weekend. Has tried magnesium, dulcolax. Hasn't tried miralax. Was doing metamucil in past. Metamucil helps some but not quick.   Drinking at least 64 oz of water daily.   Has tried to start eating more fruits and vegetables. Working on weight loss to help blood  pressure.   Had stomach issues when she was much younger. As she got older it got better.   Worst of pain is in upper abd. Has not noted heartburn and doesn't feel like she gets relief with antacids.   Does get relief after BM. No blood in stools. No nausea or vomiting. No fevers.    No Known Allergies Current Meds  Medication Sig  . Acetaminophen 500 MG coapsule Take by mouth.  . APPLE CIDER VINEGAR PO Take by mouth. Takes 2 tsps every morning  . Ascorbic Acid (VITAMIN C PO) Take 1,000 mg by mouth daily.  . Cholecalciferol (VITAMIN D3 PO) Take 1,000 Units by mouth.  . Multiple Vitamins-Minerals (MULTIVITAMIN ADULT PO) Take by mouth.  . TURMERIC PO Take by mouth.  . zinc gluconate 50 MG tablet Take 50 mg by mouth daily.    Review of Systems  Constitutional: Negative for chills, fatigue and fever.  Respiratory: Negative for cough, chest tightness, shortness of breath and wheezing.   Cardiovascular: Negative for chest pain, palpitations and leg swelling.    Objective:  BP 133/71   Pulse 79   Wt 164 lb (74.4 kg)   LMP 01/11/2017 (Exact Date)   BMI 32.03 kg/m   Weight: 164 lb (74.4 kg)   BP Readings from Last 3 Encounters:  06/03/19 133/71  05/07/19 (!) 150/80  04/08/19 (!) 150/96   Wt Readings from Last 3 Encounters:  06/03/19 164 lb (74.4 kg)  05/07/19 171 lb (77.6 kg)  04/08/19 175 lb 14.4 oz (79.8 kg)    EXAM:  GENERAL: alert, oriented, appears well and in no acute distress  HEENT: atraumatic, conjunctiva clear, no obvious abnormalities on inspection of external nose and ears  NECK: normal movements of the head and neck  LUNGS: on inspection no signs of respiratory distress, breathing rate appears normal, no obvious gross SOB, gasping or wheezing  CV: no obvious cyanosis  MS: moves all visible extremities without noticeable abnormality  PSYCH/NEURO: pleasant and cooperative, no obvious depression or anxiety, speech and thought processing grossly  intact   Assessment/Plan 1. Constipation, unspecified constipation type Trial miralax (first BID x 2 days, then daily) with check in on Friday to ensure getting some improvement. Follow up pending this. Could consider sending back to GI for colonoscopy repeat since due in fall. Can consider amitiza or linzess if miralax is not effective.   Return for pending mychart update/response this week.  I discussed the assessment and treatment plan with the patient. The patient was provided an opportunity to ask questions and all were answered. The patient agreed with the plan and demonstrated an understanding of the instructions.   The patient was advised to call back or seek an in-person evaluation if the symptoms worsen or if the condition fails to improve as anticipated.  I provided 30 minutes of non-face-to-face time during this encounter which included visit time, charting, and: We discussed miralax, how to take this medication, goal of treatment and potential alternative treatments should this not be effective. We discussed concerning signs/symptoms to watch for as well as follow up plan.  Micheline Rough, MD

## 2019-06-03 NOTE — Telephone Encounter (Signed)
Pt would like a call back to get clarification on how to take Miralax.

## 2019-06-03 NOTE — Telephone Encounter (Signed)
I would suggest for today and tomorrow to take twice daily (should be one capful and it will be marked for normal dose) and then do just once daily from there. If loose stools then back off to either half dose or every other day or hold until formed again.

## 2019-06-03 NOTE — Telephone Encounter (Signed)
Patient informed of the message below.

## 2019-06-10 ENCOUNTER — Emergency Department (HOSPITAL_COMMUNITY): Payer: PPO

## 2019-06-10 ENCOUNTER — Encounter (HOSPITAL_COMMUNITY): Payer: Self-pay | Admitting: *Deleted

## 2019-06-10 ENCOUNTER — Telehealth: Payer: Self-pay | Admitting: Family Medicine

## 2019-06-10 ENCOUNTER — Emergency Department (HOSPITAL_COMMUNITY)
Admission: EM | Admit: 2019-06-10 | Discharge: 2019-06-10 | Disposition: A | Discharge status: Home / Self Care | Payer: PPO | Attending: Emergency Medicine | Admitting: Emergency Medicine

## 2019-06-10 DIAGNOSIS — R079 Chest pain, unspecified: Secondary | ICD-10-CM | POA: Diagnosis not present

## 2019-06-10 DIAGNOSIS — Z5321 Procedure and treatment not carried out due to patient leaving prior to being seen by health care provider: Secondary | ICD-10-CM | POA: Insufficient documentation

## 2019-06-10 DIAGNOSIS — R109 Unspecified abdominal pain: Secondary | ICD-10-CM | POA: Diagnosis not present

## 2019-06-10 LAB — BASIC METABOLIC PANEL
Anion gap: 13 (ref 5–15)
BUN: 12 mg/dL (ref 8–23)
CO2: 25 mmol/L (ref 22–32)
Calcium: 9.8 mg/dL (ref 8.9–10.3)
Chloride: 102 mmol/L (ref 98–111)
Creatinine, Ser: 0.89 mg/dL (ref 0.44–1.00)
GFR calc Af Amer: >60 mL/min (ref >60)
GFR calc non Af Amer: >60 mL/min (ref >60)
Glucose, Bld: 107 mg/dL — ABNORMAL HIGH (ref 70–99)
Potassium: 3.7 mmol/L (ref 3.5–5.1)
Sodium: 140 mmol/L (ref 135–145)

## 2019-06-10 LAB — CBC
HCT: 43.3 % (ref 36.0–46.0)
Hemoglobin: 14 g/dL (ref 12.0–15.0)
MCH: 28.7 pg (ref 26.0–34.0)
MCHC: 32.3 g/dL (ref 30.0–36.0)
MCV: 88.9 fL (ref 80.0–100.0)
Platelets: 353 10*3/uL (ref 150–400)
RBC: 4.87 MIL/uL (ref 3.87–5.11)
RDW: 13.5 % (ref 11.5–15.5)
WBC: 8 10*3/uL (ref 4.0–10.5)
nRBC: 0 % (ref 0.0–0.2)

## 2019-06-10 LAB — TROPONIN I (HIGH SENSITIVITY): Troponin I (High Sensitivity): 8 ng/L (ref <18)

## 2019-06-10 MED ORDER — SODIUM CHLORIDE 0.9% FLUSH: 3.0000 mL | Freq: Once | INTRAVENOUS | Status: DC

## 2019-06-10 NOTE — Telephone Encounter (Signed)
I have not reviewed everything from ED, but it looks like we have opening on Friday at 11:30 and she could be in person rather than virtual if she would like.

## 2019-06-10 NOTE — Telephone Encounter (Signed)
Nurse Triage sent the patient to the ED for severe abdominal pain that is wrapping around the back.

## 2019-06-10 NOTE — ED Triage Notes (Signed)
To ED for eval of abd pain that started 3 wks ago. States she has started to have epigastric burning over the past few days. This pain is worse after she eats. Burning in epigastric area worse when laying down - mostly in middle of night. Denies nausea/vomiting.

## 2019-06-12 ENCOUNTER — Other Ambulatory Visit: Payer: Self-pay

## 2019-06-12 ENCOUNTER — Ambulatory Visit (INDEPENDENT_AMBULATORY_CARE_PROVIDER_SITE_OTHER): Payer: PPO | Admitting: Family Medicine

## 2019-06-12 ENCOUNTER — Encounter: Payer: Self-pay | Admitting: Family Medicine

## 2019-06-12 VITALS — BP 140/80 | HR 82 | Temp 96.6°F | Ht 60.0 in | Wt 164.2 lb

## 2019-06-12 DIAGNOSIS — R1013 Epigastric pain: Secondary | ICD-10-CM

## 2019-06-12 MED ORDER — SUCRALFATE 1 G PO TABS: 1.0000 g | ORAL_TABLET | Freq: Three times a day (TID) | ORAL | 1 refills | Status: DC

## 2019-06-12 MED ORDER — OMEPRAZOLE 40 MG PO CPDR: 40.0000 mg | DELAYED_RELEASE_CAPSULE | Freq: Every day | ORAL | 3 refills | Status: DC

## 2019-06-12 NOTE — Patient Instructions (Addendum)
Update me next week with how belly is feeling.    Gastritis, Adult Gastritis is inflammation of the stomach. There are two kinds of gastritis:  Acute gastritis. This kind develops suddenly.  Chronic gastritis. This kind is much more common and lasts for a long time. Gastritis happens when the lining of the stomach becomes weak or gets damaged. Without treatment, gastritis can lead to stomach bleeding and ulcers. What are the causes? This condition may be caused by:  An infection.  Drinking too much alcohol.  Certain medicines. These include steroids, antibiotics, and some over-the-counter medicines, such as aspirin or ibuprofen.  Having too much acid in the stomach.  A disease of the intestines or stomach.  Stress.  An allergic reaction.  Crohn's disease.  Some cancer treatments (radiation). Sometimes the cause of this condition is not known. What are the signs or symptoms? Symptoms of this condition include:  Pain or a burning sensation in the upper abdomen.  Nausea.  Vomiting.  An uncomfortable feeling of fullness after eating.  Weight loss.  Bad breath.  Blood in your vomit or stools. In some cases, there are no symptoms. How is this diagnosed? This condition may be diagnosed with:  Your medical history and a description of your symptoms.  A physical exam.  Tests. These can include: ? Blood tests. ? Stool tests. ? A test in which a thin, flexible instrument with a light and a camera is passed down the esophagus and into the stomach (upper endoscopy). ? A test in which a sample of tissue is taken for testing (biopsy). How is this treated? This condition may be treated with medicines. The medicines that are used vary depending on the cause of the gastritis:  If the condition is caused by a bacterial infection, you may be given antibiotic medicines.  If the condition is caused by too much acid in the stomach, you may be given medicines called H2  blockers, proton pump inhibitors, or antacids. Treatment may also involve stopping the use of certain medicines, such as aspirin, ibuprofen, or other NSAIDs. Follow these instructions at home: Medicines  Take over-the-counter and prescription medicines only as told by your health care provider.  If you were prescribed an antibiotic medicine, take it as told by your health care provider. Do not stop taking the antibiotic even if you start to feel better. Eating and drinking   Eat small, frequent meals instead of large meals.  Avoid foods and drinks that make your symptoms worse.  Drink enough fluid to keep your urine pale yellow. Alcohol use  Do not drink alcohol if: ? Your health care provider tells you not to drink. ? You are pregnant, may be pregnant, or are planning to become pregnant.  If you drink alcohol: ? Limit your use to:  0-1 drink a day for women.  0-2 drinks a day for men. ? Be aware of how much alcohol is in your drink. In the [place] one drink equals one 12 oz bottle of beer (355 mL), one 5 oz glass of wine (148 mL), or one 1 oz glass of hard liquor (44 mL). General instructions  Talk with your health care provider about ways to manage stress, such as getting regular exercise or practicing deep breathing, meditation, or yoga.  Do not use any products that contain nicotine or tobacco, such as cigarettes and e-cigarettes. If you need help quitting, ask your health care provider.  Keep all follow-up visits as told by your health care  provider. This is important. Contact a health care provider if:  Your symptoms get worse.  Your symptoms return after treatment. Get help right away if:  You vomit blood or material that looks like coffee grounds.  You have black or dark red stools.  You are unable to keep fluids down.  Your abdominal pain gets worse.  You have a fever.  You do not feel better after one week. Summary  Gastritis is inflammation of the  lining of the stomach that can occur suddenly (acute) or develop slowly over time (chronic).  This condition is diagnosed with a medical history, a physical exam, or tests.  This condition may be treated with medicines to treat infection or medicines to reduce the amount of acid in your stomach.  Follow your health care provider's instructions about taking medicines, making changes to your diet, and knowing when to call for help. This information is not intended to replace advice given to you by your health care provider. Make sure you discuss any questions you have with your health care provider. Document Revised: 06/18/2017 Document Reviewed: 06/18/2017 Elsevier Patient Education  [employer].  Heartburn Heartburn is a type of pain or discomfort that can happen in the throat or chest. It is often described as a burning pain. It may also cause a bad, acid-like taste in the mouth. Heartburn may feel worse when you lie down or bend over, and it is often worse at night. Heartburn may be caused by stomach contents that move back up into the esophagus (reflux). Follow these instructions at home: Eating and drinking   Avoid certain foods and drinks as told by your health care provider. This may include: ? Coffee and tea (with or without caffeine). ? Drinks that contain alcohol. ? Energy drinks and sports drinks. ? Carbonated drinks or sodas. ? Chocolate and cocoa. ? Peppermint and mint flavorings. ? Garlic and onions. ? Horseradish. ? Spicy and acidic foods, including peppers, chili powder, curry powder, vinegar, hot sauces, and barbecue sauce. ? Citrus fruit juices and citrus fruits, such as oranges, lemons, and limes. ? Tomato-based foods, such as red sauce, chili, salsa, and pizza with red sauce. ? Fried and fatty foods, such as donuts, french fries, potato chips, and high-fat dressings. ? High-fat meats, such as hot dogs and fatty cuts of red and white meats, such as rib eye  steak, sausage, ham, and bacon. ? High-fat dairy items, such as whole milk, butter, and cream cheese.  Eat small, frequent meals instead of large meals.  Avoid drinking large amounts of liquid with your meals.  Avoid eating meals during the 2-3 hours before bedtime.  Avoid lying down right after you eat.  Do not exercise right after you eat. Lifestyle      If you are overweight, reduce your weight to an amount that is healthy for you. Ask your health care provider for guidance about a safe weight loss goal.  Do not use any products that contain nicotine or tobacco, such as cigarettes, e-cigarettes, and chewing tobacco. These can make your symptoms worse. If you need help quitting, ask your health care provider.  Wear loose-fitting clothing. Do not wear anything tight around your waist that causes pressure on your abdomen.  Raise (elevate) the head of your bed about 6 inches (15 cm) when you sleep.  Try to reduce your stress, such as with yoga or meditation. If you need help reducing stress, ask your health care provider. General instructions  Pay  attention to any changes in your symptoms.  Take over-the-counter and prescription medicines only as told by your health care provider. ? Do not take aspirin, ibuprofen, or other NSAIDs unless your health care provider told you to do so. ? Stop medicines only as told by your health care provider. If you stop taking some medicines too quickly, your symptoms may get worse.  Keep all follow-up visits as told by your health care provider. This is important. Contact a health care provider if:  You have new symptoms.  You have unexplained weight loss.  You have difficulty swallowing, or it hurts to swallow.  You have wheezing or a persistent cough.  Your symptoms do not improve with treatment.  You have frequent heartburn for more than 2 weeks. Get help right away if:  You have pain in your arms, neck, jaw, teeth, or back.  You  feel sweaty, dizzy, or light-headed.  You have chest pain or shortness of breath.  You vomit and your vomit looks like blood or coffee grounds.  Your stool is bloody or black. These symptoms may represent a serious problem that is an emergency. Do not wait to see if the symptoms will go away. Get medical help right away. Call your local emergency services (911 in the [place] Do not drive yourself to the hospital. Summary  Heartburn is a type of pain or discomfort that can happen in the throat or chest. It is often described as a burning pain. It may also cause a bad, acid-like taste in the mouth.  Avoid certain foods and drinks as told by your health care provider.  Take over-the-counter and prescription medicines only as told by your health care provider. Do not take aspirin, ibuprofen, or other NSAIDs unless your health care provider told you to do so.  Contact a health care provider if your symptoms do not improve or they get worse. This information is not intended to replace advice given to you by your health care provider. Make sure you discuss any questions you have with your health care provider. Document Revised: 07/01/2017 Document Reviewed: 07/01/2017 Elsevier Patient Education  [employer].

## 2019-06-12 NOTE — Progress Notes (Signed)
Colleen Coffey DOB: 1951/01/09 Encounter date: 06/12/2019  This is a 69 y.o. female who presents with Chief Complaint  Patient presents with  . Follow-up    History of present illness: Feeling better today than last couple of days.  Has been able to have a BM in the morning - has been soft which has helped stomach feel better. Does feel like she is emptying better at this point.   Now having more pain at top of stomach - and then on weds had pain radiating up into chest. Today not in chest, but having epigastric. Gets worse when she eats and as day goes on. By afternoon taking ibuprofen, has to take off bra. On weds started out bad. Has tried anti-gas/antacids but no help with these.   Has not been able to eat raw fruits/veggies/spicy as this all worsens it. No popcorn.    No Known Allergies Current Meds  Medication Sig  . Acetaminophen 500 MG coapsule Take by mouth.  Marland Kitchen amLODipine (NORVASC) 2.5 MG tablet Take 1 tablet (2.5 mg total) by mouth daily.  . APPLE CIDER VINEGAR PO Take by mouth. Takes 2 tsps every morning  . Ascorbic Acid (VITAMIN C PO) Take 1,000 mg by mouth daily.  . Cholecalciferol (VITAMIN D3 PO) Take 1,000 Units by mouth.  . TURMERIC PO Take by mouth.  . zinc gluconate 50 MG tablet Take 50 mg by mouth daily.    Review of Systems  Constitutional: Negative for chills, fatigue and fever.  Respiratory: Negative for cough, chest tightness, shortness of breath and wheezing.   Cardiovascular: Negative for chest pain, palpitations and leg swelling.  Gastrointestinal: Positive for abdominal pain. Negative for blood in stool, constipation (this has improved), diarrhea, nausea and vomiting.  Genitourinary: Negative for dysuria, frequency and urgency.    Objective:  BP 140/80 (BP Location: Left Arm, Patient Position: Sitting, Cuff Size: Large)   Pulse 82   Temp (!) 96.6 F (35.9 C) (Temporal)   Ht 5' (1.524 m)   Wt 164 lb 3.2 oz (74.5 kg)   LMP 01/11/2017 (Exact  Date)   SpO2 97%   BMI 32.07 kg/m   Weight: 164 lb 3.2 oz (74.5 kg)   BP Readings from Last 3 Encounters:  06/12/19 140/80  06/10/19 (!) 164/73  06/03/19 133/71   Wt Readings from Last 3 Encounters:  06/12/19 164 lb 3.2 oz (74.5 kg)  06/10/19 165 lb (74.8 kg)  06/03/19 164 lb (74.4 kg)    Physical Exam Constitutional:      General: She is not in acute distress.    Appearance: She is well-developed.  Cardiovascular:     Rate and Rhythm: Normal rate and regular rhythm.     Heart sounds: Normal heart sounds. No murmur. No friction rub.  Pulmonary:     Effort: Pulmonary effort is normal. No respiratory distress.     Breath sounds: Normal breath sounds. No wheezing or rales.  Abdominal:     General: Bowel sounds are normal.     Tenderness: There is abdominal tenderness in the epigastric area. There is no right CVA tenderness, left CVA tenderness, guarding or rebound. Murphy's sign: slightly positive, but less tender than epigastric.  Musculoskeletal:     Right lower leg: No edema.     Left lower leg: No edema.  Neurological:     Mental Status: She is alert and oriented to person, place, and time.  Psychiatric:        Behavior: Behavior normal.  Assessment/Plan  1. Epigastric pain Suspect gastritis vs ulcer. Avoid anti-inflammatories, avoid aggravating foods/drinks (reviewed). Let us know if worsening (may need GI eval/EGD) but will try prilosec, carafate for sx relief and advised ok to take pepcid if needed. She will update me next week. - omeprazole (PRILOSEC) 40 MG capsule; Take 1 capsule (40 mg total) by mouth daily.  Dispense: 30 capsule; Refill: 3 - sucralfate (CARAFATE) 1 g tablet; Take 1 tablet (1 g total) by mouth 4 (four) times daily -  with meals and at bedtime.  Dispense: 90 tablet; Refill: 1    Return if symptoms worsen or fail to improve. We also reviewed normal EKG sinus from ER, we discussed potential diagnoses and discussed how treatment/meds will help  with symptoms. Total visit time including charting 30 min.   Micheline Rough, MD

## 2019-07-06 ENCOUNTER — Encounter: Payer: Self-pay | Admitting: Family Medicine

## 2019-07-07 ENCOUNTER — Encounter: Payer: Self-pay | Admitting: Gastroenterology

## 2019-07-07 MED ORDER — LUBIPROSTONE 8 MCG PO CAPS: 8.0000 ug | ORAL_CAPSULE | Freq: Two times a day (BID) | ORAL | 2 refills | Status: DC

## 2019-07-21 ENCOUNTER — Other Ambulatory Visit: Payer: Self-pay | Admitting: Family Medicine

## 2019-07-21 DIAGNOSIS — R1013 Epigastric pain: Secondary | ICD-10-CM

## 2019-07-30 ENCOUNTER — Ambulatory Visit: Payer: PPO | Admitting: Gastroenterology

## 2019-07-30 ENCOUNTER — Encounter: Payer: Self-pay | Admitting: Gastroenterology

## 2019-07-30 VITALS — BP 152/70 | HR 76 | Ht 60.0 in | Wt 159.6 lb

## 2019-07-30 DIAGNOSIS — K921 Melena: Secondary | ICD-10-CM

## 2019-07-30 DIAGNOSIS — R194 Change in bowel habit: Secondary | ICD-10-CM

## 2019-07-30 DIAGNOSIS — K5900 Constipation, unspecified: Secondary | ICD-10-CM | POA: Diagnosis not present

## 2019-07-30 MED ORDER — SUTAB 1479-225-188 MG PO TABS: 1.0000 | ORAL_TABLET | ORAL | 0 refills | Status: DC

## 2019-07-30 MED ORDER — LINACLOTIDE 290 MCG PO CAPS: 290.0000 ug | ORAL_CAPSULE | Freq: Every day | ORAL | 11 refills | Status: DC

## 2019-07-30 NOTE — Patient Instructions (Addendum)
Dr Fuller Plan recommends that you complete a bowel purge (to clean out your bowels). Please do the following: Purchase a bottle of Miralax over the counter as well as a box of 5 mg dulcolax tablets. Take 4 dulcolax tablets. Wait 1 hour. You will then drink 6-8 capfuls of Miralax mixed in an adequate amount of water/juice/gatorade (you may choose which of these liquids to drink) over the next 2-3 hours. You should expect results within 1 to 6 hours after completing the bowel purge.  After you have finished the bowel purge, pleas start Linzess 290 mcg samples taking one capsule by mouth daily in the morning. A prescription has also been sent to your pharmacy.    You have been scheduled for a colonoscopy. Please follow written instructions given to you at your visit today.  Please pick up your prep supplies at the pharmacy within the next 1-3 days.   Normal BMI (Body Mass Index- based on height and weight) is between 23 and 30. Your BMI today is Body mass index is 31.17 kg/m. Marland Kitchen Please consider follow up  regarding your BMI with your Primary Care Provider.  Thank you for choosing me and [facility].  Pricilla Riffle. Dagoberto Ligas., MD., Marval Regal

## 2019-07-30 NOTE — Progress Notes (Signed)
History of Present Illness: This is a 69 year old female referred by Colleen Macadam, MD for the evaluation of change in bowel habits, worsening constipation, lower abdominal pain, bloating and small-volume rectal bleeding.  She relates worsening problems with constipation since February.  MiraLAX twice daily was not effective. Amitiza 8 mcg bid was not effective. She has used multiple over-the-counter laxatives which have not been effective.  She relates small stools, incomplete fecal evacuation and intermittent small amounts of rectal bleeding.  Lower abdominal pain and bloating is partially relieved with bowel movements.  Colonoscopy performed in October 2008 showed small internal hemorrhoids and was otherwise normal. Cologuard in Sept 2018 was negative. Denies weight loss, diarrhea, change in stool caliber, melena, hematochezia, nausea, vomiting, dysphagia, reflux symptoms, chest pain.     No Known Allergies Outpatient Medications Prior to Visit  Medication Sig Dispense Refill  . amLODipine (NORVASC) 2.5 MG tablet Take 1 tablet (2.5 mg total) by mouth daily. 90 tablet 1  . APPLE CIDER VINEGAR PO Take by mouth. Takes 2 tsps every morning    . Cholecalciferol (VITAMIN D3 PO) Take 1,000 Units by mouth.    . lubiprostone (AMITIZA) 8 MCG capsule Take 1 capsule (8 mcg total) by mouth 2 (two) times daily with a meal. 60 capsule 2  . omeprazole (PRILOSEC) 40 MG capsule Take 1 capsule (40 mg total) by mouth daily. 30 capsule 3  . sucralfate (CARAFATE) 1 g tablet TAKE 1 TABLET BY MOUTH 4 TIMES A DAY WITH MEALS AND AT BEDTIME 90 tablet 0  . Acetaminophen 500 MG coapsule Take by mouth.    . Ascorbic Acid (VITAMIN C PO) Take 1,000 mg by mouth daily.    . TURMERIC PO Take by mouth.    . zinc gluconate 50 MG tablet Take 50 mg by mouth daily.     No facility-administered medications prior to visit.   Past Medical History:  Diagnosis Date  . Constipation    chronic, improved with healthy diet   . Endocervical polyp   . Hemorrhoids    resolved  . Hypertension   . Osteoarthritis, knee 06/27/2015   -saw [facility]   . Osteopenia 06/27/2015   -reports on medication remotely with gyn    Past Surgical History:  Procedure Laterality Date  . APPENDECTOMY    . BACK SURGERY  2014   bulging disc lumbar spine  . BREAST CYST ASPIRATION Right   . CESAREAN SECTION     2   Social History   Socioeconomic History  . Marital status: Widowed    Spouse name: Not on file  . Number of children: Not on file  . Years of education: Not on file  . Highest education level: Not on file  Occupational History  . Not on file  Tobacco Use  . Smoking status: Never Smoker  . Smokeless tobacco: Never Used  Substance and Sexual Activity  . Alcohol use: No  . Drug use: No  . Sexual activity: Never    Birth control/protection: Post-menopausal  Other Topics Concern  . Not on file  Social History Narrative   Work or School: retired, used to be a Librarian, academic for [employer]      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; as a Engineer, materials; husband and mother passed in 2013      Spiritual Beliefs: Christian      Lifestyle: exercising; eating healthy      Social Determinants of Health   Financial Resource Strain:   .  Difficulty of Paying Living Expenses:   Food Insecurity:   . Worried About Charity fundraiser in the Last Year:   . Arboriculturist in the Last Year:   Transportation Needs:   . Film/video editor (Medical):   Marland Kitchen Lack of Transportation (Non-Medical):   Physical Activity:   . Days of Exercise per Week:   . Minutes of Exercise per Session:   Stress:   . Feeling of Stress :   Social Connections:   . Frequency of Communication with Friends and Family:   . Frequency of Social Gatherings with Friends and Family:   . Attends Religious Services:   . Active Member of Clubs or Organizations:   . Attends Archivist Meetings:   Marland Kitchen Marital Status:    Family History  Problem  Relation Age of Onset  . Hyperlipidemia Other   . Hypertension Other   . Depression Other   . High blood pressure Mother   . High Cholesterol Mother   . Dementia Mother   . Arthritis Mother   . Heart disease Mother   . Syncope episode Mother   . High blood pressure Father   . COPD Father   . High blood pressure Sister   . COPD Brother   . High blood pressure Brother   . High blood pressure Maternal Grandmother   . High Cholesterol Maternal Grandmother   . Dementia Maternal Grandmother   . Arthritis Maternal Grandmother   . Parkinson's disease Sister   . Arthritis Sister   . Esophageal cancer Neg Hx   . Colon cancer Neg Hx   . Pancreatic cancer Neg Hx   . Stomach cancer Neg Hx   . Liver disease Neg Hx      Review of Systems: Pertinent positive and negative review of systems were noted in the above HPI section. All other review of systems were otherwise negative.   Physical Exam: General: Well developed, well nourished, no acute distress Head: Normocephalic and atraumatic Eyes:  sclerae anicteric, EOMI Ears: Normal auditory acuity Mouth: Not examined, mask on during Covid-19 pandemic Neck: Supple, no masses or thyromegaly Lungs: Clear throughout to auscultation Heart: Regular rate and rhythm; no murmurs, rubs or bruits Abdomen: Soft, non tender and non distended. No masses, hepatosplenomegaly or hernias noted. Normal Bowel sounds Rectal: Deferred to colonoscopoy Musculoskeletal: Symmetrical with no gross deformities  Skin: No lesions on visible extremities Pulses:  Normal pulses noted Extremities: No clubbing, cyanosis, edema or deformities noted Neurological: Alert oriented x 4, grossly nonfocal Cervical Nodes:  No significant cervical adenopathy Inguinal Nodes: No significant inguinal adenopathy Psychological:  Alert and cooperative. Normal mood and affect   Assessment and Recommendations:  1.  Change in bowel habits, worsening constipation, lower abdominal  pain, bloating, small-volume hematochezia.  Rule out colorectal neoplasms, partial obstruction, benign constipation with hemorrhoidal bleeding.  Bowel purge with MiraLAX bowel prep and then begin Linzess 290 mcg daily.  If symptoms not adequately controlled she is advised to call for further advice prior to colonoscopy.  Schedule colonoscopy. The risks (including bleeding, perforation, infection, missed lesions, medication reactions and possible hospitalization or surgery if complications occur), benefits, and alternatives to colonoscopy with possible biopsy and possible polypectomy were discussed with the patient and they consent to proceed.     cc: Colleen Macadam, MD [address] [place],  [place] [id number]

## 2019-08-09 ENCOUNTER — Other Ambulatory Visit: Payer: Self-pay | Admitting: Family Medicine

## 2019-08-09 DIAGNOSIS — R1013 Epigastric pain: Secondary | ICD-10-CM

## 2019-08-10 ENCOUNTER — Encounter: Payer: Self-pay | Admitting: Gastroenterology

## 2019-08-10 ENCOUNTER — Other Ambulatory Visit: Payer: Self-pay

## 2019-08-10 ENCOUNTER — Ambulatory Visit (AMBULATORY_SURGERY_CENTER): Payer: PPO | Admitting: Gastroenterology

## 2019-08-10 VITALS — BP 138/76 | HR 55 | Temp 96.9°F | Resp 13 | Ht 60.0 in | Wt 159.0 lb

## 2019-08-10 DIAGNOSIS — K219 Gastro-esophageal reflux disease without esophagitis: Secondary | ICD-10-CM | POA: Diagnosis not present

## 2019-08-10 DIAGNOSIS — K640 First degree hemorrhoids: Secondary | ICD-10-CM

## 2019-08-10 DIAGNOSIS — K635 Polyp of colon: Secondary | ICD-10-CM

## 2019-08-10 DIAGNOSIS — K5900 Constipation, unspecified: Secondary | ICD-10-CM | POA: Diagnosis not present

## 2019-08-10 DIAGNOSIS — K921 Melena: Secondary | ICD-10-CM | POA: Diagnosis not present

## 2019-08-10 DIAGNOSIS — I10 Essential (primary) hypertension: Secondary | ICD-10-CM | POA: Diagnosis not present

## 2019-08-10 DIAGNOSIS — R194 Change in bowel habit: Secondary | ICD-10-CM | POA: Diagnosis not present

## 2019-08-10 DIAGNOSIS — D122 Benign neoplasm of ascending colon: Secondary | ICD-10-CM

## 2019-08-10 MED ORDER — SODIUM CHLORIDE 0.9 % IV SOLN
500.0000 mL | Freq: Once | INTRAVENOUS | Status: DC
Start: 2019-08-10 — End: 2019-08-10

## 2019-08-10 NOTE — Progress Notes (Signed)
Report to PACU, RN, vss, BBS= Clear.  

## 2019-08-10 NOTE — Patient Instructions (Signed)
Impression/Recommendations:  Polyp handout given to patient. Hemorrhoid handout given to patient.  Repeat colonoscopy for surveillance.  Date to be determined after pathology results reviewed.  Resume previous diet. Continue present medications. Await pathology results.  YOU HAD AN ENDOSCOPIC PROCEDURE TODAY AT [facility]:   Refer to the procedure report that was given to you for any specific questions about what was found during the examination.  If the procedure report does not answer your questions, please call your gastroenterologist to clarify.  If you requested that your care partner not be given the details of your procedure findings, then the procedure report has been included in a sealed envelope for you to review at your convenience later.  YOU SHOULD EXPECT: Some feelings of bloating in the abdomen. Passage of more gas than usual.  Walking can help get rid of the air that was put into your GI tract during the procedure and reduce the bloating. If you had a lower endoscopy (such as a colonoscopy or flexible sigmoidoscopy) you may notice spotting of blood in your stool or on the toilet paper. If you underwent a bowel prep for your procedure, you may not have a normal bowel movement for a few days.  Please Note:  You might notice some irritation and congestion in your nose or some drainage.  This is from the oxygen used during your procedure.  There is no need for concern and it should clear up in a day or so.  SYMPTOMS TO REPORT IMMEDIATELY:   Following lower endoscopy (colonoscopy or flexible sigmoidoscopy):  Excessive amounts of blood in the stool  Significant tenderness or worsening of abdominal pains  Swelling of the abdomen that is new, acute  Fever of 100F or higher For urgent or emergent issues, a gastroenterologist can be reached at any hour by calling [phone]. Do not use MyChart messaging for urgent concerns.    DIET:  We do recommend a small  meal at first, but then you may proceed to your regular diet.  Drink plenty of fluids but you should avoid alcoholic beverages for 24 hours.  ACTIVITY:  You should plan to take it easy for the rest of today and you should NOT DRIVE or use heavy machinery until tomorrow (because of the sedation medicines used during the test).    FOLLOW UP: Our staff will call the number listed on your records 48-72 hours following your procedure to check on you and address any questions or concerns that you may have regarding the information given to you following your procedure. If we do not reach you, we will leave a message.  We will attempt to reach you two times.  During this call, we will ask if you have developed any symptoms of COVID 19. If you develop any symptoms (ie: fever, flu-like symptoms, shortness of breath, cough etc.) before then, please call [phone].  If you test positive for Covid 19 in the 2 weeks post procedure, please call and report this information to [place].    If any biopsies were taken you will be contacted by phone or by letter within the next 1-3 weeks.  Please call us at [phone] if you have not heard about the biopsies in 3 weeks.    SIGNATURES/CONFIDENTIALITY: You and/or your care partner have signed paperwork which will be entered into your electronic medical record.  These signatures attest to the fact that that the information above on your After Visit Summary has been reviewed and is  understood.  Full responsibility of the confidentiality of this discharge information lies with you and/or your care-partner.

## 2019-08-10 NOTE — Progress Notes (Signed)
VS by VV

## 2019-08-10 NOTE — Op Note (Signed)
[name] Procedure Date: 08/10/2019 8:49 AM MRN: [id number] Endoscopist: Ladene Artist , MD Age: 69 Referring MD:  Date of Birth: 1951/01/31 Gender: Female Account #: [id number] Procedure:                Colonoscopy Indications:              Hematochezia, Change in bowel habits Medicines:                Monitored Anesthesia Care Procedure:                Pre-Anesthesia Assessment:                           - Prior to the procedure, a History and Physical                            was performed, and patient medications and                            allergies were reviewed. The patient's tolerance of                            previous anesthesia was also reviewed. The risks                            and benefits of the procedure and the sedation                            options and risks were discussed with the patient.                            All questions were answered, and informed consent                            was obtained. Prior Anticoagulants: The patient has                            taken no previous anticoagulant or antiplatelet                            agents. ASA Grade Assessment: II - A patient with                            mild systemic disease. After reviewing the risks                            and benefits, the patient was deemed in                            satisfactory condition to undergo the procedure.                           After obtaining informed consent, the colonoscope  was passed under direct vision. Throughout the                            procedure, the patient's blood pressure, pulse, and                            oxygen saturations were monitored continuously. The                            Colonoscope was introduced through the anus and                            advanced to the the cecum, identified by                            appendiceal orifice and ileocecal  valve. The                            ileocecal valve, appendiceal orifice, and rectum                            were photographed. The quality of the bowel                            preparation was good. The colonoscopy was performed                            without difficulty. The patient tolerated the                            procedure well. Scope In: 8:54:45 AM Scope Out: 9:08:38 AM Scope Withdrawal Time: 0 hours 11 minutes 43 seconds  Total Procedure Duration: 0 hours 13 minutes 53 seconds  Findings:                 The perianal and digital rectal examinations were                            normal.                           A 4 mm polyp was found in the ascending colon. The                            polyp was sessile. The polyp was removed with a                            cold biopsy forceps. Resection and retrieval were                            complete.                           Internal hemorrhoids were found during  retroflexion. The hemorrhoids were small and Grade                            I (internal hemorrhoids that do not prolapse).                           The exam was otherwise without abnormality on                            direct and retroflexion views. Complications:            No immediate complications. Estimated blood loss:                            None. Estimated Blood Loss:     Estimated blood loss: none. Impression:               - One 4 mm polyp in the ascending colon, removed                            with a cold biopsy forceps. Resected and retrieved.                           - Internal hemorrhoids.                           - The examination was otherwise normal on direct                            and retroflexion views. Recommendation:           - Repeat colonoscopy after studies are complete for                            surveillance based on pathology results.                           - Patient has a  contact number available for                            emergencies. The signs and symptoms of potential                            delayed complications were discussed with the                            patient. Return to normal activities tomorrow.                            Written discharge instructions were provided to the                            patient.                           - Resume previous diet.                           -  Continue present medications.                           - Await pathology results. Ladene Artist, MD 08/10/2019 9:11:29 AM This report has been signed electronically.

## 2019-08-12 ENCOUNTER — Telehealth: Payer: Self-pay

## 2019-08-12 NOTE — Telephone Encounter (Signed)
°  Follow up Call-  Call back number 08/10/2019  Post procedure Call Back phone  # [phone]  Permission to leave phone message Yes  Some recent data might be hidden     Patient questions:  Do you have a fever, pain , or abdominal swelling? No. Pain Score  0 *  Have you tolerated food without any problems? Yes.    Have you been able to return to your normal activities? Yes.    Do you have any questions about your discharge instructions: Diet   No. Medications  No. Follow up visit  No.  Do you have questions or concerns about your Care? No.  Actions: * If pain score is 4 or above: No action needed, pain <4.  1. Have you developed a fever since your procedure? no  2.   Have you had an respiratory symptoms (SOB or cough) since your procedure? no  3.   Have you tested positive for COVID 19 since your procedure no  4.   Have you had any family members/close contacts diagnosed with the COVID 19 since your procedure?  no   If yes to any of these questions please route to Joylene John, RN and Erenest Rasher, RN

## 2019-08-14 ENCOUNTER — Encounter: Payer: Self-pay | Admitting: Gastroenterology

## 2019-08-24 ENCOUNTER — Telehealth: Payer: Self-pay | Admitting: Gastroenterology

## 2019-08-24 MED ORDER — TRULANCE 3 MG PO TABS: 1.0000 | ORAL_TABLET | Freq: Every day | ORAL | 11 refills | Status: DC

## 2019-08-24 NOTE — Telephone Encounter (Signed)
Patient states Linzess helped for several weeks but now her bowels have slowed down. She is having small bowel movements and will go several days without a bowel movement. She has had to use a enema several times in the last few weeks to evacuate her bowels. Patient would like a alternative medication. Please advise Dr. Fuller Plan.

## 2019-08-24 NOTE — Telephone Encounter (Signed)
Best options are: Continue Linzess 290 mcg po qd and add Miralax po qd to bid Change to Trulance 3 mg po qd

## 2019-08-24 NOTE — Telephone Encounter (Signed)
Went over options with patient and patient would prefer to try Trulance. Informed patient that I sent the prescription to her pharmacy. Patient verbalized understanding.

## 2019-08-25 NOTE — Telephone Encounter (Signed)
Initiated PA on cover my meds for Trulance. Trulance was approved through 02/12/2020.

## 2019-11-05 ENCOUNTER — Encounter: Payer: Self-pay | Admitting: Family Medicine

## 2019-11-17 ENCOUNTER — Other Ambulatory Visit: Payer: Self-pay

## 2019-11-17 ENCOUNTER — Ambulatory Visit (INDEPENDENT_AMBULATORY_CARE_PROVIDER_SITE_OTHER): Payer: PPO

## 2019-11-17 VITALS — Wt 155.0 lb

## 2019-11-17 DIAGNOSIS — Z0000 Encounter for general adult medical examination without abnormal findings: Secondary | ICD-10-CM

## 2019-11-17 NOTE — Progress Notes (Signed)
Virtual Visit via Telephone Note  I connected with  Colleen Coffey on 11/17/19 at 11:00 AM EDT by telephone and verified that I am speaking with the correct person using two identifiers.  Medicare Annual Wellness visit completed telephonically due to Covid-19 pandemic.   Persons participating in this call: This Health Coach and this patient.   Location: Patient: Home Provider: Office   I discussed the limitations, risks, security and privacy concerns of performing an evaluation and management service by telephone and the availability of in person appointments. The patient expressed understanding and agreed to proceed.  Unable to perform video visit due to video visit attempted and failed and/or patient does not have video capability.   Some vital signs may be absent or patient reported.   Willette Brace, LPN    Subjective:   Colleen Coffey is a 69 y.o. female who presents for Medicare Annual (Subsequent) preventive examination.  Review of Systems     Cardiac Risk Factors include: advanced age (>76men, >29 women);obesity (BMI >30kg/m2)     Objective:    Today's Vitals   11/17/19 1057  Weight: 155 lb (70.3 kg)   Body mass index is 30.27 kg/m.  Advanced Directives 11/17/2019 06/04/2017  Does Patient Have a Medical Advance Directive? Yes Yes  Type of Paramedic of [place];Living will [place];Living will  Copy of [place] in Chart? No - copy requested -    Current Medications (verified) Outpatient Encounter Medications as of 11/17/2019  Medication Sig  . amLODipine (NORVASC) 2.5 MG tablet Take 1 tablet (2.5 mg total) by mouth daily.  . Cholecalciferol (VITAMIN D3 PO) Take 1,000 Units by mouth.  . Plecanatide (TRULANCE) 3 MG TABS Take 1 tablet by mouth daily.  . [DISCONTINUED] APPLE CIDER VINEGAR PO Take by mouth. Takes 2 tsps every morning (Patient not taking: Reported on 11/17/2019)  .  [DISCONTINUED] omeprazole (PRILOSEC) 40 MG capsule Take 1 capsule (40 mg total) by mouth daily. (Patient not taking: Reported on 11/17/2019)  . [DISCONTINUED] sucralfate (CARAFATE) 1 g tablet TAKE 1 TABLET BY MOUTH 4 TIMES A DAY WITH MEALS AND AT BEDTIME (Patient not taking: Reported on 11/17/2019)   No facility-administered encounter medications on file as of 11/17/2019.    Allergies (verified) Patient has no known allergies.   History: Past Medical History:  Diagnosis Date  . Constipation    chronic, improved with healthy diet  . Endocervical polyp   . GERD (gastroesophageal reflux disease)   . Hemorrhoids    resolved  . Hypertension   . Osteoarthritis, knee 06/27/2015   -saw [facility]   . Osteopenia 06/27/2015   -reports on medication remotely with gyn    Past Surgical History:  Procedure Laterality Date  . APPENDECTOMY    . BACK SURGERY  2014   bulging disc lumbar spine  . BREAST CYST ASPIRATION Right   . CESAREAN SECTION     2   Family History  Problem Relation Age of Onset  . Hyperlipidemia Other   . Hypertension Other   . Depression Other   . High blood pressure Mother   . High Cholesterol Mother   . Dementia Mother   . Arthritis Mother   . Heart disease Mother   . Syncope episode Mother   . High blood pressure Father   . COPD Father   . High blood pressure Sister   . COPD Brother   . High blood pressure Brother   .  High blood pressure Maternal Grandmother   . High Cholesterol Maternal Grandmother   . Dementia Maternal Grandmother   . Arthritis Maternal Grandmother   . Parkinson's disease Sister   . Arthritis Sister   . Esophageal cancer Neg Hx   . Colon cancer Neg Hx   . Pancreatic cancer Neg Hx   . Stomach cancer Neg Hx   . Liver disease Neg Hx    Social History   Socioeconomic History  . Marital status: Widowed    Spouse name: Not on file  . Number of children: Not on file  . Years of education: Not on file  . Highest education level:  Not on file  Occupational History  . Occupation: Retired  Tobacco Use  . Smoking status: Never Smoker  . Smokeless tobacco: Never Used  Vaping Use  . Vaping Use: Never used  Substance and Sexual Activity  . Alcohol use: No  . Drug use: No  . Sexual activity: Never    Birth control/protection: Post-menopausal  Other Topics Concern  . Not on file  Social History Narrative   Work or School: retired, used to be a Librarian, academic for [employer]      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; as a Engineer, materials; husband and mother passed in 2013      Spiritual Beliefs: Christian      Lifestyle: exercising; eating healthy      Social Determinants of Health   Financial Resource Strain: [place]   . Difficulty of Paying Living Expenses: Not hard at all  Food Insecurity: No Food Insecurity  . Worried About Charity fundraiser in the Last Year: Never true  . Ran Out of Food in the Last Year: Never true  Transportation Needs: No Transportation Needs  . Lack of Transportation (Medical): No  . Lack of Transportation (Non-Medical): No  Physical Activity: Sufficiently Active  . Days of Exercise per Week: 4 days  . Minutes of Exercise per Session: 50 min  Stress: No Stress Concern Present  . Feeling of Stress : Only a little  Social Connections: Moderately Isolated  . Frequency of Communication with Friends and Family: More than three times a week  . Frequency of Social Gatherings with Friends and Family: More than three times a week  . Attends Religious Services: More than 4 times per year  . Active Member of Clubs or Organizations: No  . Attends Archivist Meetings: Never  . Marital Status: Widowed    Tobacco Counseling Counseling given: Not Answered   Clinical Intake:  Pre-visit preparation completed: Yes  Pain : No/denies pain     BMI - recorded: 31.05 Nutritional Status: BMI > 30  Obese Nutritional Risks: None Diabetes: No  How often do you need to have someone help you when  you read instructions, pamphlets, or other written materials from your doctor or pharmacy?: 1 - Never  Diabetic?No  Interpreter Needed?: No  Information entered by :: Charlott Rakes, LPN   Activities of Daily Living In your present state of health, do you have any difficulty performing the following activities: 11/17/2019  Hearing? N  Vision? N  Difficulty concentrating or making decisions? N  Walking or climbing stairs? N  Dressing or bathing? N  Doing errands, shopping? N  Preparing Food and eating ? N  Using the Toilet? N  In the past six months, have you accidently leaked urine? N  Do you have problems with loss of bowel control? N  Managing your  Medications? N  Managing your Finances? N  Housekeeping or managing your Housekeeping? N  Some recent data might be hidden    Patient Care Team: Caren Macadam, MD as PCP - General (Family Medicine)  Indicate any recent Medical Services you may have received from other than [facility] providers in the past year (date may be approximate).     Assessment:   This is a routine wellness examination for [place].  Hearing/Vision screen  Hearing Screening   125Hz  250Hz  500Hz  1000Hz  2000Hz  3000Hz  4000Hz  6000Hz  8000Hz   Right ear:           Left ear:           Comments: Pt denies any hearing difficulty  Vision Screening Comments: Pt follows up with [facility] has an appt 11/20/19 @10  a.m  Dietary issues and exercise activities discussed: Current Exercise Habits: Home exercise routine, Type of exercise: walking;Other - see comments (stationary bike), Time (Minutes): 45, Frequency (Times/Week): 4, Weekly Exercise (Minutes/Week): 180  Goals    . Patient Stated     Stay healthy      Depression Screen PHQ 2/9 Scores 11/17/2019 04/09/2019 04/08/2019 11/13/2018 01/02/2018 10/10/2017 10/04/2016  PHQ - 2 Score 0 0 0 1 0 1 0  PHQ- 9 Score - - - - 1 - -    Fall Risk Fall Risk  11/17/2019 04/08/2019 11/13/2018 10/10/2017 10/04/2016  Falls  in the past year? 1 0 1 Yes No  Number falls in past yr: 1 0 0 1 -  Injury with Fall? 0 0 0 Yes -  Risk for fall due to : Impaired vision;History of fall(s) - - - -  Follow up Falls prevention discussed - - - -    Any stairs in or around the home? Yes  If so, are there any without handrails? No  Home free of loose throw rugs in walkways, pet beds, electrical cords, etc? Yes  Adequate lighting in your home to reduce risk of falls? Yes   ASSISTIVE DEVICES UTILIZED TO PREVENT FALLS:  Life alert? No  Use of a cane, Coffey or w/c? No  Grab bars in the bathroom? Yes  Shower chair or bench in shower? Yes  Elevated toilet seat or a handicapped toilet? Yes   TIMED UP AND GO:  Was the test performed? No .   Cognitive Function:     6CIT Screen 11/17/2019  What Year? 0 points  What month? 0 points  Count back from 20 0 points  Months in reverse 0 points  Repeat phrase 2 points    Immunizations Immunization History  Administered Date(s) Administered  . Fluad Quad(high Dose 65+) 10/16/2018  . Influenza Split 11/15/2010, 11/08/2011  . Influenza Whole 11/26/2006, 11/25/2007, 11/17/2008, 11/02/2009  . Influenza, High Dose Seasonal PF 10/21/2014, 10/27/2015, 10/25/2016, 10/10/2017  . Influenza-Unspecified 10/13/2013, 10/29/2019  . PFIZER SARS-COV-2 Vaccination 03/21/2019, 04/11/2019, 11/13/2019  . Pneumococcal Conjugate-13 12/16/2014  . Pneumococcal Polysaccharide-23 10/04/2016  . Td 02/12/1998, 01/04/2009, 09/25/2019  . Zoster 02/19/2014  . Zoster Recombinat (Shingrix) 06/23/2019, 09/02/2019    TDAP status: Up to date Flu Vaccine status: Up to date Pneumococcal vaccine status: Up to date Covid-19 vaccine status: Completed vaccines  Qualifies for Shingles Vaccine? Yes   Zostavax completed Yes   Shingrix Completed?: Yes  Screening Tests Health Maintenance  Topic Date Due  . Fecal DNA (Cologuard)  10/19/2019  . MAMMOGRAM  02/08/2021  . TETANUS/TDAP  09/24/2029  .  INFLUENZA VACCINE  Completed  . DEXA  SCAN  Completed  . COVID-19 Vaccine  Completed  . Hepatitis C Screening  Completed  . PNA vac Low Risk Adult  Completed    Health Maintenance  Health Maintenance Due  Topic Date Due  . Fecal DNA (Cologuard)  10/19/2019    Colorectal cancer screening: Completed 08/10/19. Repeat every 3 years Mammogram status: Completed 02/09/19. Repeat every year Bone Density status: Completed 02/03/18. Results reflect: Bone density results: OSTEOPENIA. Repeat every 2 years.    Additional Screening:  Hepatitis C Screening:  Completed 10/04/16  Vision Screening: Recommended annual ophthalmology exams for early detection of glaucoma and other disorders of the eye. Is the patient up to date with their annual eye exam?  Yes  Who is the provider or what is the name of the office in which the patient attends annual eye exams? [facility] for exams   Dental Screening: Recommended annual dental exams for proper oral hygiene  Community Resource Referral / Chronic Care Management: CRR required this visit?  No   CCM required this visit?  No      Plan:     I have personally reviewed and noted the following in the patient's chart:   . Medical and social history . Use of alcohol, tobacco or illicit drugs  . Current medications and supplements . Functional ability and status . Nutritional status . Physical activity . Advanced directives . List of other physicians . Hospitalizations, surgeries, and ER visits in previous 12 months . Vitals . Screenings to include cognitive, depression, and falls . Referrals and appointments  In addition, I have reviewed and discussed with patient certain preventive protocols, quality metrics, and best practice recommendations. A written personalized care plan for preventive services as well as general preventive health recommendations were provided to patient.     Willette Brace, LPN   [id number]   Nurse Notes:  None

## 2019-11-17 NOTE — Patient Instructions (Addendum)
Colleen Coffey , Thank you for taking time to come for your Medicare Wellness Visit. I appreciate your ongoing commitment to your health goals. Please review the following plan we discussed and let me know if I can assist you in the future.   Screening recommendations/referrals: Colonoscopy: Done 08/10/19 Mammogram: Done 02/09/19 Bone Density: Done 02/03/18 Recommended yearly ophthalmology/optometry visit for glaucoma screening and checkup Recommended yearly dental visit for hygiene and checkup  Vaccinations: Influenza vaccine: Done 10/29/19 Pneumococcal vaccine: Up to date Tdap vaccine: Done 09/25/19 Shingles vaccine: Completed 5/11 & 09/02/19   Covid-19:Completed 2/6 & 04/11/19, Booster given at [facility] 11/13/19  Advanced directives: Please bring a copy of your health care power of attorney and living will to the office at your convenience.  Conditions/risks identified: Stay Healthy  Next appointment: Follow up in one year for your annual wellness visit    Preventive Care 65 Years and Older, Female Preventive care refers to lifestyle choices and visits with your health care provider that can promote health and wellness. What does preventive care include?  A yearly physical exam. This is also called an annual well check.  Dental exams once or twice a year.  Routine eye exams. Ask your health care provider how often you should have your eyes checked.  Personal lifestyle choices, including:  Daily care of your teeth and gums.  Regular physical activity.  Eating a healthy diet.  Avoiding tobacco and drug use.  Limiting alcohol use.  Practicing safe sex.  Taking low-dose aspirin every day.  Taking vitamin and mineral supplements as recommended by your health care provider. What happens during an annual well check? The services and screenings done by your health care provider during your annual well check will depend on your age, overall health, lifestyle risk factors, and family  history of disease. Counseling  Your health care provider may ask you questions about your:  Alcohol use.  Tobacco use.  Drug use.  Emotional well-being.  Home and relationship well-being.  Sexual activity.  Eating habits.  History of falls.  Memory and ability to understand (cognition).  Work and work Statistician.  Reproductive health. Screening  You may have the following tests or measurements:  Height, weight, and BMI.  Blood pressure.  Lipid and cholesterol levels. These may be checked every 5 years, or more frequently if you are over 58 years old.  Skin check.  Lung cancer screening. You may have this screening every year starting at age 70 if you have a 30-pack-year history of smoking and currently smoke or have quit within the past 15 years.  Fecal occult blood test (FOBT) of the stool. You may have this test every year starting at age 70.  Flexible sigmoidoscopy or colonoscopy. You may have a sigmoidoscopy every 5 years or a colonoscopy every 10 years starting at age 70.  Hepatitis C blood test.  Hepatitis B blood test.  Sexually transmitted disease (STD) testing.  Diabetes screening. This is done by checking your blood sugar (glucose) after you have not eaten for a while (fasting). You may have this done every 1-3 years.  Bone density scan. This is done to screen for osteoporosis. You may have this done starting at age 70.  Mammogram. This may be done every 1-2 years. Talk to your health care provider about how often you should have regular mammograms. Talk with your health care provider about your test results, treatment options, and if necessary, the need for more tests. Vaccines  Your health care provider  may recommend certain vaccines, such as:  Influenza vaccine. This is recommended every year.  Tetanus, diphtheria, and acellular pertussis (Tdap, Td) vaccine. You may need a Td booster every 10 years.  Zoster vaccine. You may need this after  age 70.  Pneumococcal 13-valent conjugate (PCV13) vaccine. One dose is recommended after age 70.  Pneumococcal polysaccharide (PPSV23) vaccine. One dose is recommended after age 70. Talk to your health care provider about which screenings and vaccines you need and how often you need them. This information is not intended to replace advice given to you by your health care provider. Make sure you discuss any questions you have with your health care provider. Document Released: 02/25/2015 Document Revised: 10/19/2015 Document Reviewed: 11/30/2014 Elsevier Interactive Patient Education  2017 [employer] Prevention in the Home Falls can cause injuries. They can happen to people of all ages. There are many things you can do to make your home safe and to help prevent falls. What can I do on the outside of my home?  Regularly fix the edges of walkways and driveways and fix any cracks.  Remove anything that might make you trip as you walk through a door, such as a raised step or threshold.  Trim any bushes or trees on the path to your home.  Use bright outdoor lighting.  Clear any walking paths of anything that might make someone trip, such as rocks or tools.  Regularly check to see if handrails are loose or broken. Make sure that both sides of any steps have handrails.  Any raised decks and porches should have guardrails on the edges.  Have any leaves, snow, or ice cleared regularly.  Use sand or salt on walking paths during winter.  Clean up any spills in your garage right away. This includes oil or grease spills. What can I do in the bathroom?  Use night lights.  Install grab bars by the toilet and in the tub and shower. Do not use towel bars as grab bars.  Use non-skid mats or decals in the tub or shower.  If you need to sit down in the shower, use a plastic, non-slip stool.  Keep the floor dry. Clean up any water that spills on the floor as soon as it  happens.  Remove soap buildup in the tub or shower regularly.  Attach bath mats securely with double-sided non-slip rug tape.  Do not have throw rugs and other things on the floor that can make you trip. What can I do in the bedroom?  Use night lights.  Make sure that you have a light by your bed that is easy to reach.  Do not use any sheets or blankets that are too big for your bed. They should not hang down onto the floor.  Have a firm chair that has side arms. You can use this for support while you get dressed.  Do not have throw rugs and other things on the floor that can make you trip. What can I do in the kitchen?  Clean up any spills right away.  Avoid walking on wet floors.  Keep items that you use a lot in easy-to-reach places.  If you need to reach something above you, use a strong step stool that has a grab bar.  Keep electrical cords out of the way.  Do not use floor polish or wax that makes floors slippery. If you must use wax, use non-skid floor wax.  Do not have throw rugs  and other things on the floor that can make you trip. What can I do with my stairs?  Do not leave any items on the stairs.  Make sure that there are handrails on both sides of the stairs and use them. Fix handrails that are broken or loose. Make sure that handrails are as long as the stairways.  Check any carpeting to make sure that it is firmly attached to the stairs. Fix any carpet that is loose or worn.  Avoid having throw rugs at the top or bottom of the stairs. If you do have throw rugs, attach them to the floor with carpet tape.  Make sure that you have a light switch at the top of the stairs and the bottom of the stairs. If you do not have them, ask someone to add them for you. What else can I do to help prevent falls?  Wear shoes that:  Do not have high heels.  Have rubber bottoms.  Are comfortable and fit you well.  Are closed at the toe. Do not wear sandals.  If you  use a stepladder:  Make sure that it is fully opened. Do not climb a closed stepladder.  Make sure that both sides of the stepladder are locked into place.  Ask someone to hold it for you, if possible.  Clearly mark and make sure that you can see:  Any grab bars or handrails.  First and last steps.  Where the edge of each step is.  Use tools that help you move around (mobility aids) if they are needed. These include:  Canes.  Walkers.  Scooters.  Crutches.  Turn on the lights when you go into a dark area. Replace any light bulbs as soon as they burn out.  Set up your furniture so you have a clear path. Avoid moving your furniture around.  If any of your floors are uneven, fix them.  If there are any pets around you, be aware of where they are.  Review your medicines with your doctor. Some medicines can make you feel dizzy. This can increase your chance of falling. Ask your doctor what other things that you can do to help prevent falls. This information is not intended to replace advice given to you by your health care provider. Make sure you discuss any questions you have with your health care provider. Document Released: 11/25/2008 Document Revised: 07/07/2015 Document Reviewed: 03/05/2014 Elsevier Interactive Patient Education  2017 [employer].

## 2019-11-22 ENCOUNTER — Other Ambulatory Visit: Payer: Self-pay | Admitting: Family Medicine

## 2019-11-30 ENCOUNTER — Other Ambulatory Visit: Payer: Self-pay | Admitting: Family Medicine

## 2019-11-30 DIAGNOSIS — Z1231 Encounter for screening mammogram for malignant neoplasm of breast: Secondary | ICD-10-CM

## 2019-12-18 ENCOUNTER — Other Ambulatory Visit: Payer: Self-pay | Admitting: Family Medicine

## 2020-02-10 ENCOUNTER — Other Ambulatory Visit: Payer: Self-pay

## 2020-02-10 ENCOUNTER — Ambulatory Visit
Admission: RE | Admit: 2020-02-10 | Discharge: 2020-02-10 | Disposition: A | Discharge status: Home / Self Care | Payer: PPO | Source: Ambulatory Visit | Attending: Family Medicine | Admitting: Family Medicine

## 2020-02-10 DIAGNOSIS — Z1231 Encounter for screening mammogram for malignant neoplasm of breast: Secondary | ICD-10-CM | POA: Diagnosis not present

## 2020-03-04 ENCOUNTER — Encounter: Payer: Self-pay | Admitting: Family Medicine

## 2020-03-30 ENCOUNTER — Encounter: Payer: Self-pay | Admitting: Family Medicine

## 2020-04-01 MED ORDER — TIZANIDINE HCL 2 MG PO CAPS: 2.0000 mg | ORAL_CAPSULE | Freq: Every evening | ORAL | 0 refills | Status: DC | PRN

## 2020-04-13 ENCOUNTER — Encounter: Payer: PPO | Admitting: Family Medicine

## 2020-04-15 ENCOUNTER — Encounter: Payer: PPO | Admitting: Family Medicine

## 2020-04-18 ENCOUNTER — Other Ambulatory Visit: Payer: Self-pay | Admitting: Family Medicine

## 2020-04-26 ENCOUNTER — Other Ambulatory Visit: Payer: Self-pay

## 2020-04-27 ENCOUNTER — Encounter: Payer: Self-pay | Admitting: Family Medicine

## 2020-04-27 ENCOUNTER — Ambulatory Visit (INDEPENDENT_AMBULATORY_CARE_PROVIDER_SITE_OTHER): Payer: PPO | Admitting: Family Medicine

## 2020-04-27 ENCOUNTER — Other Ambulatory Visit (HOSPITAL_COMMUNITY)
Admission: RE | Admit: 2020-04-27 | Discharge: 2020-04-27 | Disposition: A | Discharge status: Home / Self Care | Payer: PPO | Source: Ambulatory Visit | Attending: Family Medicine | Admitting: Family Medicine

## 2020-04-27 VITALS — BP 140/80 | HR 69 | Temp 97.4°F | Ht 60.0 in | Wt 164.2 lb

## 2020-04-27 DIAGNOSIS — Z124 Encounter for screening for malignant neoplasm of cervix: Secondary | ICD-10-CM | POA: Diagnosis not present

## 2020-04-27 DIAGNOSIS — M8580 Other specified disorders of bone density and structure, unspecified site: Secondary | ICD-10-CM | POA: Diagnosis not present

## 2020-04-27 DIAGNOSIS — Z13220 Encounter for screening for lipoid disorders: Secondary | ICD-10-CM | POA: Diagnosis not present

## 2020-04-27 DIAGNOSIS — I10 Essential (primary) hypertension: Secondary | ICD-10-CM | POA: Diagnosis not present

## 2020-04-27 DIAGNOSIS — Z0000 Encounter for general adult medical examination without abnormal findings: Secondary | ICD-10-CM

## 2020-04-27 DIAGNOSIS — R739 Hyperglycemia, unspecified: Secondary | ICD-10-CM

## 2020-04-27 DIAGNOSIS — Z1151 Encounter for screening for human papillomavirus (HPV): Secondary | ICD-10-CM | POA: Insufficient documentation

## 2020-04-27 LAB — LIPID PANEL
Cholesterol: 208 mg/dL — ABNORMAL HIGH (ref 0–200)
HDL: 69.2 mg/dL (ref >39.00)
LDL Cholesterol: 123 mg/dL — ABNORMAL HIGH (ref 0–99)
NonHDL: 139.08
Total CHOL/HDL Ratio: 3
Triglycerides: 81 mg/dL (ref 0.0–149.0)
VLDL: 16.2 mg/dL (ref 0.0–40.0)

## 2020-04-27 LAB — COMPREHENSIVE METABOLIC PANEL
ALT: 18 U/L (ref 0–35)
AST: 17 U/L (ref 0–37)
Albumin: 4.1 g/dL (ref 3.5–5.2)
Alkaline Phosphatase: 66 U/L (ref 39–117)
BUN: 15 mg/dL (ref 6–23)
CO2: 30 mEq/L (ref 19–32)
Calcium: 9.9 mg/dL (ref 8.4–10.5)
Chloride: 104 mEq/L (ref 96–112)
Creatinine, Ser: 0.68 mg/dL (ref 0.40–1.20)
GFR: 88.02 mL/min (ref >60.00)
Glucose, Bld: 94 mg/dL (ref 70–99)
Potassium: 4.5 mEq/L (ref 3.5–5.1)
Sodium: 139 mEq/L (ref 135–145)
Total Bilirubin: 0.5 mg/dL (ref 0.2–1.2)
Total Protein: 7.2 g/dL (ref 6.0–8.3)

## 2020-04-27 LAB — CBC WITH DIFFERENTIAL/PLATELET
Basophils Absolute: 0 10*3/uL (ref 0.0–0.1)
Basophils Relative: 0.7 % (ref 0.0–3.0)
Eosinophils Absolute: 0.1 10*3/uL (ref 0.0–0.7)
Eosinophils Relative: 1.9 % (ref 0.0–5.0)
HCT: 41.5 % (ref 36.0–46.0)
Hemoglobin: 13.9 g/dL (ref 12.0–15.0)
Lymphocytes Relative: 27.1 % (ref 12.0–46.0)
Lymphs Abs: 1.8 10*3/uL (ref 0.7–4.0)
MCHC: 33.4 g/dL (ref 30.0–36.0)
MCV: 87.5 fl (ref 78.0–100.0)
Monocytes Absolute: 0.4 10*3/uL (ref 0.1–1.0)
Monocytes Relative: 6.4 % (ref 3.0–12.0)
Neutro Abs: 4.3 10*3/uL (ref 1.4–7.7)
Neutrophils Relative %: 63.9 % (ref 43.0–77.0)
Platelets: 338 10*3/uL (ref 150.0–400.0)
RBC: 4.75 Mil/uL (ref 3.87–5.11)
RDW: 14.2 % (ref 11.5–15.5)
WBC: 6.7 10*3/uL (ref 4.0–10.5)

## 2020-04-27 LAB — HEMOGLOBIN A1C: Hgb A1c MFr Bld: 5.8 % (ref 4.6–6.5)

## 2020-04-27 NOTE — Progress Notes (Signed)
Colleen Coffey DOB: [birth year] Encounter date: 04/27/2020  This is a 70 y.o. female who presents for complete physical   History of present illness/Additional concerns: States doing ok. No specific concerns today except joint pains. Neck, low back, legs, knees. Not sure what is worst; depends on the day. No swelling in knees. Neck pain started in right side of neck/shoulder and up to neck. Has to use caution not to move head too quickly. Muscle relaxer did help with neck pain. Low back just bothers more if sitting or standing too long. Not constant. Not bad enough she wants to do something interventional or follow with specialist.   Riding stationary bike at least 5 times/weak.   Doesn't regularly check bp at home; just a couple of times since starting medication. No problems with amlodipine 2.5mg . no issues with low pressure.   Abdominal discomfort is better. Acid reflux/carafate made her pain go away. trulance helped with constipation. Has looser stools now. But feels better to be able to empty completely.  Mammogram completed 02/10/20 and was normal.  Past Medical History:  Diagnosis Date  . Constipation    chronic, improved with healthy diet  . Endocervical polyp   . GERD (gastroesophageal reflux disease)   . Hemorrhoids    resolved  . Hypertension   . Osteoarthritis, knee 06/27/2015   -saw [facility]   . Osteopenia 06/27/2015   -reports on medication remotely with gyn    Past Surgical History:  Procedure Laterality Date  . APPENDECTOMY    . BACK SURGERY  2014   bulging disc lumbar spine  . BREAST CYST ASPIRATION Right   . CESAREAN SECTION     2   No Known Allergies Current Meds  Medication Sig  . amLODipine (NORVASC) 2.5 MG tablet TAKE 1 TABLET BY MOUTH EVERY DAY  . Cholecalciferol (VITAMIN D3 PO) Take 1,000 Units by mouth.  . Plecanatide (TRULANCE) 3 MG TABS Take 1 tablet by mouth daily.  . [DISCONTINUED] tizanidine (ZANAFLEX) 2 MG capsule Take 1-2  capsules (2-4 mg total) by mouth at bedtime as needed for muscle spasms.   Social History   Tobacco Use  . Smoking status: Never Smoker  . Smokeless tobacco: Never Used  Substance Use Topics  . Alcohol use: No   Family History  Problem Relation Age of Onset  . Hyperlipidemia Other   . Hypertension Other   . Depression Other   . High blood pressure Mother   . High Cholesterol Mother   . Dementia Mother   . Arthritis Mother   . Heart disease Mother   . Syncope episode Mother   . High blood pressure Father   . COPD Father   . High blood pressure Sister   . COPD Brother   . High blood pressure Brother   . High blood pressure Maternal Grandmother   . High Cholesterol Maternal Grandmother   . Dementia Maternal Grandmother   . Arthritis Maternal Grandmother   . Parkinson's disease Sister   . Arthritis Sister   . Esophageal cancer Neg Hx   . Colon cancer Neg Hx   . Pancreatic cancer Neg Hx   . Stomach cancer Neg Hx   . Liver disease Neg Hx      Review of Systems  Constitutional: Negative for activity change, appetite change, chills, fatigue, fever and unexpected weight change.  HENT: Negative for congestion, ear pain, hearing loss, sinus pressure, sinus pain, sore throat and trouble swallowing.   Eyes: Negative for  pain and visual disturbance.  Respiratory: Negative for cough, chest tightness, shortness of breath and wheezing.   Cardiovascular: Negative for chest pain, palpitations and leg swelling.  Gastrointestinal: Negative for abdominal pain, blood in stool, constipation, diarrhea, nausea and vomiting.  Genitourinary: Negative for difficulty urinating and menstrual problem.  Musculoskeletal: Negative for arthralgias and back pain.  Skin: Negative for rash.  Neurological: Negative for dizziness, weakness, numbness and headaches.  Hematological: Negative for adenopathy. Does not bruise/bleed easily.  Psychiatric/Behavioral: Negative for sleep disturbance and suicidal  ideas. The patient is not nervous/anxious.     CBC:  Lab Results  Component Value Date   WBC 6.7 04/27/2020   HGB 13.9 04/27/2020   HCT 41.5 04/27/2020   MCH 28.7 06/10/2019   MCHC 33.4 04/27/2020   RDW 14.2 04/27/2020   PLT 338.0 04/27/2020   CMP: Lab Results  Component Value Date   NA 139 04/27/2020   K 4.5 04/27/2020   CL 104 04/27/2020   CO2 30 04/27/2020   ANIONGAP 13 06/10/2019   GLUCOSE 94 04/27/2020   BUN 15 04/27/2020   CREATININE 0.68 04/27/2020   GFRAA >60 06/10/2019   CALCIUM 9.9 04/27/2020   PROT 7.2 04/27/2020   BILITOT 0.5 04/27/2020   ALKPHOS 66 04/27/2020   ALT 18 04/27/2020   AST 17 04/27/2020   LIPID: Lab Results  Component Value Date   CHOL 208 (H) 04/27/2020   TRIG 81.0 04/27/2020   HDL 69.20 04/27/2020   LDLCALC 123 (H) 04/27/2020    Objective:  BP 140/80 (BP Location: Right Arm, Patient Position: Sitting, Cuff Size: Large)   Pulse 69   Temp (!) 97.4 F (36.3 C) (Oral)   Ht 5' (1.524 m)   Wt 164 lb 3.2 oz (74.5 kg)   LMP 01/11/2017 (Exact Date)   SpO2 98%   BMI 32.07 kg/m   Weight: 164 lb 3.2 oz (74.5 kg)   BP Readings from Last 3 Encounters:  04/27/20 140/80  08/10/19 138/76  07/30/19 (!) 152/70   Wt Readings from Last 3 Encounters:  04/27/20 164 lb 3.2 oz (74.5 kg)  11/17/19 155 lb (70.3 kg)  08/10/19 159 lb (72.1 kg)    Physical Exam Exam conducted with a chaperone present.  Constitutional:      General: She is not in acute distress.    Appearance: She is well-developed.  HENT:     Head: Normocephalic and atraumatic.     Right Ear: External ear normal.     Left Ear: External ear normal.     Mouth/Throat:     Pharynx: No oropharyngeal exudate.  Eyes:     Conjunctiva/sclera: Conjunctivae normal.     Pupils: Pupils are equal, round, and reactive to light.  Neck:     Thyroid: No thyromegaly.  Cardiovascular:     Rate and Rhythm: Normal rate and regular rhythm.     Heart sounds: Normal heart sounds. No murmur  heard. No friction rub. No gallop.   Pulmonary:     Effort: Pulmonary effort is normal.     Breath sounds: Normal breath sounds.  Abdominal:     General: Bowel sounds are normal. There is no distension.     Palpations: Abdomen is soft. There is no mass.     Tenderness: There is no abdominal tenderness. There is no guarding.     Hernia: No hernia is present.  Genitourinary:    Exam position: Supine.     Vagina: Normal.     Cervix: Normal.  Uterus: Normal.      Adnexa: Right adnexa normal and left adnexa normal.  Musculoskeletal:        General: No tenderness or deformity. Normal range of motion.     Cervical back: Normal range of motion and neck supple.  Lymphadenopathy:     Cervical: No cervical adenopathy.  Skin:    General: Skin is warm and dry.     Findings: No rash.     Comments: No noted abnormal skin moles  Neurological:     Mental Status: She is alert and oriented to person, place, and time.     Deep Tendon Reflexes: Reflexes normal.     Reflex Scores:      Tricep reflexes are 2+ on the right side and 2+ on the left side.      Bicep reflexes are 2+ on the right side and 2+ on the left side.      Brachioradialis reflexes are 2+ on the right side and 2+ on the left side.      Patellar reflexes are 2+ on the right side and 2+ on the left side. Psychiatric:        Speech: Speech normal.        Behavior: Behavior normal.        Thought Content: Thought content normal.     Assessment/Plan: Health Maintenance Due  Topic Date Due  . Fecal DNA (Cologuard)  10/19/2019   Health Maintenance reviewed.  1. Preventative health care Up to date with preventative health.   2. Osteopenia, unspecified location Last bone density was 02/03/2018.  Osteopenia is stable to slightly improved on this.  FRAX major 3.4%, hip 0.2%.  Consider repeat in 3 to 5 years.  Continue with weightbearing exercise, adequate calcium dietary intake, and vitamin D supplementation.  3. Hypertension,  unspecified type Improved control with amlodipine.  Continue 2.5 mg daily.  Encouraged her to check at home. - CBC with Differential/Platelet; Future - Comprehensive metabolic panel; Future - CBC with Differential/Platelet - Comprehensive metabolic panel  4. Hyperglycemia - Hemoglobin A1c; Future - Hemoglobin A1c  5. Lipid screening - Lipid panel; Future - Lipid panel  6. Cervical cancer screening Is normal, this can be her last screening Pap smear.  She did not have on at the age of 32 to complete screening. - PAP []  Return in about 6 months (around 10/28/2020) for Chronic condition visit.  Micheline Rough, MD

## 2020-04-28 LAB — CYTOLOGY - PAP
Comment: NEGATIVE
Diagnosis: NEGATIVE
High risk HPV: NEGATIVE

## 2020-05-02 ENCOUNTER — Telehealth: Payer: Self-pay | Admitting: Gastroenterology

## 2020-05-02 ENCOUNTER — Encounter: Payer: Self-pay | Admitting: Family Medicine

## 2020-05-02 NOTE — Telephone Encounter (Signed)
We discussed that her last colonoscopy did not havd pre-cancerous polyps and that the next screening would be age 70.  We discussed that routine screenings end at age 42.  If she felt strongly at age 77 she wanted to have a screening colonoscopy, we could discuss at that time.  She thanked me for the call.

## 2020-05-28 ENCOUNTER — Other Ambulatory Visit: Payer: Self-pay | Admitting: Family Medicine

## 2020-06-20 ENCOUNTER — Other Ambulatory Visit: Payer: Self-pay | Admitting: Family Medicine

## 2020-08-19 ENCOUNTER — Other Ambulatory Visit: Payer: Self-pay | Admitting: Gastroenterology

## 2020-09-19 ENCOUNTER — Other Ambulatory Visit: Payer: Self-pay | Admitting: Gastroenterology

## 2020-10-26 ENCOUNTER — Telehealth: Payer: Self-pay | Admitting: Gastroenterology

## 2020-10-26 NOTE — Telephone Encounter (Signed)
Patient states her prescription for Trulance has increased to $169 and she cannot afford the prescription now. Patient states she was wondering if she can have samples until her appt with Dr. Fuller Plan on 11/16/20. Informed patient I do not mind giving her samples until her appt but we can not give samples every month. Patient states she understands. Informed patient to contact her insurance company to find out what medication is preferred. Patient states she did contact them today and they state they do not see a medication in this class on her formulary. Informed patient I will give her some samples to last until her appt and she can discuss with Dr. Fuller Plan over the counter alternatives. Samples left at front desk.

## 2020-11-14 ENCOUNTER — Observation Stay (HOSPITAL_COMMUNITY)
Admission: EM | Admit: 2020-11-14 | Discharge: 2020-11-17 | Disposition: A | Discharge status: Home / Self Care | Payer: PPO | Attending: General Surgery | Admitting: General Surgery

## 2020-11-14 ENCOUNTER — Emergency Department (HOSPITAL_COMMUNITY): Payer: PPO

## 2020-11-14 ENCOUNTER — Encounter (HOSPITAL_COMMUNITY): Payer: Self-pay | Admitting: Emergency Medicine

## 2020-11-14 ENCOUNTER — Other Ambulatory Visit: Payer: Self-pay

## 2020-11-14 DIAGNOSIS — N281 Cyst of kidney, acquired: Secondary | ICD-10-CM | POA: Diagnosis not present

## 2020-11-14 DIAGNOSIS — Z79899 Other long term (current) drug therapy: Secondary | ICD-10-CM | POA: Diagnosis not present

## 2020-11-14 DIAGNOSIS — K819 Cholecystitis, unspecified: Secondary | ICD-10-CM | POA: Diagnosis not present

## 2020-11-14 DIAGNOSIS — R1011 Right upper quadrant pain: Secondary | ICD-10-CM | POA: Diagnosis present

## 2020-11-14 DIAGNOSIS — K439 Ventral hernia without obstruction or gangrene: Secondary | ICD-10-CM | POA: Diagnosis not present

## 2020-11-14 DIAGNOSIS — I700 Atherosclerosis of aorta: Secondary | ICD-10-CM | POA: Diagnosis not present

## 2020-11-14 DIAGNOSIS — I10 Essential (primary) hypertension: Secondary | ICD-10-CM | POA: Insufficient documentation

## 2020-11-14 DIAGNOSIS — K8012 Calculus of gallbladder with acute and chronic cholecystitis without obstruction: Principal | ICD-10-CM | POA: Insufficient documentation

## 2020-11-14 DIAGNOSIS — R109 Unspecified abdominal pain: Secondary | ICD-10-CM | POA: Diagnosis not present

## 2020-11-14 DIAGNOSIS — Z20822 Contact with and (suspected) exposure to covid-19: Secondary | ICD-10-CM | POA: Diagnosis not present

## 2020-11-14 DIAGNOSIS — K429 Umbilical hernia without obstruction or gangrene: Secondary | ICD-10-CM | POA: Diagnosis not present

## 2020-11-14 DIAGNOSIS — K810 Acute cholecystitis: Secondary | ICD-10-CM | POA: Diagnosis present

## 2020-11-14 LAB — COMPREHENSIVE METABOLIC PANEL
ALT: 450 U/L — ABNORMAL HIGH (ref 0–44)
AST: 811 U/L — ABNORMAL HIGH (ref 15–41)
Albumin: 3.6 g/dL (ref 3.5–5.0)
Alkaline Phosphatase: 153 U/L — ABNORMAL HIGH (ref 38–126)
Anion gap: 9 (ref 5–15)
BUN: 9 mg/dL (ref 8–23)
CO2: 28 mmol/L (ref 22–32)
Calcium: 9.2 mg/dL (ref 8.9–10.3)
Chloride: 102 mmol/L (ref 98–111)
Creatinine, Ser: 0.8 mg/dL (ref 0.44–1.00)
GFR, Estimated: >60 mL/min (ref >60)
Glucose, Bld: 150 mg/dL — ABNORMAL HIGH (ref 70–99)
Potassium: 3 mmol/L — ABNORMAL LOW (ref 3.5–5.1)
Sodium: 139 mmol/L (ref 135–145)
Total Bilirubin: 1.3 mg/dL — ABNORMAL HIGH (ref 0.3–1.2)
Total Protein: 7.3 g/dL (ref 6.5–8.1)

## 2020-11-14 LAB — CBC WITH DIFFERENTIAL/PLATELET
Abs Immature Granulocytes: 0.03 10*3/uL (ref 0.00–0.07)
Basophils Absolute: 0 10*3/uL (ref 0.0–0.1)
Basophils Relative: 0 %
Eosinophils Absolute: 0 10*3/uL (ref 0.0–0.5)
Eosinophils Relative: 0 %
HCT: 38.2 % (ref 36.0–46.0)
Hemoglobin: 12.7 g/dL (ref 12.0–15.0)
Immature Granulocytes: 0 %
Lymphocytes Relative: 16 %
Lymphs Abs: 1.5 10*3/uL (ref 0.7–4.0)
MCH: 28.8 pg (ref 26.0–34.0)
MCHC: 33.2 g/dL (ref 30.0–36.0)
MCV: 86.6 fL (ref 80.0–100.0)
Monocytes Absolute: 0.8 10*3/uL (ref 0.1–1.0)
Monocytes Relative: 8 %
Neutro Abs: 7.3 10*3/uL (ref 1.7–7.7)
Neutrophils Relative %: 76 %
Platelets: 403 10*3/uL — ABNORMAL HIGH (ref 150–400)
RBC: 4.41 MIL/uL (ref 3.87–5.11)
RDW: 13 % (ref 11.5–15.5)
WBC: 9.7 10*3/uL (ref 4.0–10.5)
nRBC: 0 % (ref 0.0–0.2)

## 2020-11-14 LAB — URINALYSIS, ROUTINE W REFLEX MICROSCOPIC
Bilirubin Urine: NEGATIVE
Glucose, UA: NEGATIVE mg/dL
Hgb urine dipstick: NEGATIVE
Ketones, ur: 5 mg/dL — AB
Leukocytes,Ua: NEGATIVE
Nitrite: NEGATIVE
Protein, ur: NEGATIVE mg/dL
Specific Gravity, Urine: 1.016 (ref 1.005–1.030)
pH: 6 (ref 5.0–8.0)

## 2020-11-14 LAB — LIPASE, BLOOD: Lipase: 25 U/L (ref 11–51)

## 2020-11-14 MED ORDER — IOHEXOL 350 MG/ML SOLN
75.0000 mL | Freq: Once | INTRAVENOUS | Status: AC | PRN
  Administered 2020-11-14: 75 mL via INTRAVENOUS

## 2020-11-14 MED ORDER — ONDANSETRON 4 MG PO TBDP
4.0000 mg | ORAL_TABLET | Freq: Once | ORAL | Status: AC
  Administered 2020-11-14: 4 mg via ORAL
  Filled 2020-11-14: qty 1

## 2020-11-14 MED ORDER — OXYCODONE-ACETAMINOPHEN 5-325 MG PO TABS
1.0000 | ORAL_TABLET | Freq: Once | ORAL | Status: AC
  Administered 2020-11-14: 1 via ORAL
  Filled 2020-11-14: qty 1

## 2020-11-14 NOTE — ED Provider Notes (Signed)
Emergency Medicine Provider Triage Evaluation Note  Rosali Augello Hamre , a 70 y.o. female  was evaluated in triage.  Pt complains of abdominal pain, intermittent over the past week but persistent today.  She has had associated vomiting.  She has had some constipation.  History of appendectomy, cesarean section, back surgery.  No chest pain or shortness of breath.  No fevers.  Review of Systems  Positive: Abdominal pain, vomiting Negative: Fever  Physical Exam  BP (!) 169/80 (BP Location: Right Arm)   Pulse 70   Temp 98.1 F (36.7 C) (Oral)   Resp 16   LMP 01/11/2017 (Exact Date)   SpO2 100%  Gen:   Patient appears uncomfortable, rocking back and forth in exam chair Resp:  Normal effort  MSK:   Moves extremities without difficulty  Other:  Generalized abdominal tenderness without rebound or guarding  Medical Decision Making  Medically screening exam initiated at 9:17 PM.  Appropriate orders placed.  Allyna Pittsley Plitt was informed that the remainder of the evaluation will be completed by another provider, this initial triage assessment does not replace that evaluation, and the importance of remaining in the ED until their evaluation is complete.     Carlisle Cater, PA-C 11/14/20 2118    Lucrezia Starch, MD 11/14/20 [phone]

## 2020-11-14 NOTE — ED Triage Notes (Signed)
Patient with abdominal pain that started this morning.  Patient states that is did go away and now it is back.  She has had nausea and vomiting with it.  Patient states she has had a small BM earlier today, but she has not had a normal one since last Wednesday.  Patient with generalized pain all over abdomen.

## 2020-11-15 ENCOUNTER — Observation Stay (HOSPITAL_COMMUNITY): Payer: PPO

## 2020-11-15 ENCOUNTER — Telehealth: Payer: Self-pay | Admitting: Family Medicine

## 2020-11-15 ENCOUNTER — Telehealth: Payer: Self-pay | Admitting: Gastroenterology

## 2020-11-15 DIAGNOSIS — K810 Acute cholecystitis: Secondary | ICD-10-CM | POA: Diagnosis present

## 2020-11-15 DIAGNOSIS — R17 Unspecified jaundice: Secondary | ICD-10-CM | POA: Diagnosis not present

## 2020-11-15 DIAGNOSIS — K8000 Calculus of gallbladder with acute cholecystitis without obstruction: Secondary | ICD-10-CM | POA: Diagnosis not present

## 2020-11-15 DIAGNOSIS — K828 Other specified diseases of gallbladder: Secondary | ICD-10-CM | POA: Diagnosis not present

## 2020-11-15 DIAGNOSIS — K862 Cyst of pancreas: Secondary | ICD-10-CM | POA: Diagnosis not present

## 2020-11-15 LAB — RESP PANEL BY RT-PCR (FLU A&B, COVID) ARPGX2
Influenza A by PCR: NEGATIVE
Influenza B by PCR: NEGATIVE
SARS Coronavirus 2 by RT PCR: NEGATIVE

## 2020-11-15 MED ORDER — SIMETHICONE 80 MG PO CHEW: 40.0000 mg | CHEWABLE_TABLET | Freq: Four times a day (QID) | ORAL | Status: DC | PRN

## 2020-11-15 MED ORDER — ONDANSETRON HCL 4 MG/2ML IJ SOLN: 4.0000 mg | Freq: Four times a day (QID) | INTRAMUSCULAR | Status: DC | PRN

## 2020-11-15 MED ORDER — GADOBUTROL 1 MMOL/ML IV SOLN
7.5000 mL | Freq: Once | INTRAVENOUS | Status: AC | PRN
  Administered 2020-11-15: 7.5 mL via INTRAVENOUS

## 2020-11-15 MED ORDER — PANTOPRAZOLE SODIUM 40 MG IV SOLR
40.0000 mg | Freq: Every day | INTRAVENOUS | Status: DC
  Administered 2020-11-15 – 2020-11-16 (×2): 40 mg via INTRAVENOUS
  Filled 2020-11-15 (×2): qty 40

## 2020-11-15 MED ORDER — ACETAMINOPHEN 650 MG RE SUPP: 650.0000 mg | Freq: Four times a day (QID) | RECTAL | Status: DC | PRN

## 2020-11-15 MED ORDER — ENOXAPARIN SODIUM 40 MG/0.4ML IJ SOSY
40.0000 mg | PREFILLED_SYRINGE | INTRAMUSCULAR | Status: DC
  Administered 2020-11-15 – 2020-11-16 (×2): 40 mg via SUBCUTANEOUS
  Filled 2020-11-15 (×2): qty 0.4

## 2020-11-15 MED ORDER — ACETAMINOPHEN 325 MG PO TABS: 650.0000 mg | ORAL_TABLET | Freq: Four times a day (QID) | ORAL | Status: DC | PRN

## 2020-11-15 MED ORDER — DIPHENHYDRAMINE HCL 50 MG/ML IJ SOLN: 12.5000 mg | Freq: Four times a day (QID) | INTRAMUSCULAR | Status: DC | PRN

## 2020-11-15 MED ORDER — POLYETHYLENE GLYCOL 3350 17 G PO PACK: 17.0000 g | PACK | Freq: Every day | ORAL | Status: DC | PRN

## 2020-11-15 MED ORDER — OXYCODONE HCL 5 MG PO TABS
5.0000 mg | ORAL_TABLET | ORAL | Status: DC | PRN
  Administered 2020-11-15: 5 mg via ORAL
  Administered 2020-11-16: 10 mg via ORAL
  Filled 2020-11-15: qty 2
  Filled 2020-11-15: qty 1

## 2020-11-15 MED ORDER — TIZANIDINE HCL 4 MG PO TABS
2.0000 mg | ORAL_TABLET | Freq: Every evening | ORAL | Status: DC | PRN
  Filled 2020-11-15: qty 1

## 2020-11-15 MED ORDER — AMLODIPINE BESYLATE 2.5 MG PO TABS
2.5000 mg | ORAL_TABLET | Freq: Every day | ORAL | Status: DC
  Administered 2020-11-15 – 2020-11-17 (×3): 2.5 mg via ORAL
  Filled 2020-11-15 (×3): qty 1

## 2020-11-15 MED ORDER — METOPROLOL TARTRATE 5 MG/5ML IV SOLN
5.0000 mg | Freq: Four times a day (QID) | INTRAVENOUS | Status: DC | PRN
Start: 2020-11-15 — End: 2020-11-17

## 2020-11-15 MED ORDER — POTASSIUM CHLORIDE IN NACL 20-0.9 MEQ/L-% IV SOLN
INTRAVENOUS | Status: DC
  Filled 2020-11-15 (×2): qty 1000

## 2020-11-15 MED ORDER — DOCUSATE SODIUM 100 MG PO CAPS
100.0000 mg | ORAL_CAPSULE | Freq: Two times a day (BID) | ORAL | Status: DC
  Administered 2020-11-15 – 2020-11-17 (×3): 100 mg via ORAL
  Filled 2020-11-15 (×4): qty 1

## 2020-11-15 MED ORDER — CEFTRIAXONE SODIUM 2 G IJ SOLR
2.0000 g | INTRAMUSCULAR | Status: DC
  Administered 2020-11-15 – 2020-11-16 (×2): 2 g via INTRAVENOUS
  Filled 2020-11-15 (×3): qty 20

## 2020-11-15 MED ORDER — MORPHINE SULFATE (PF) 2 MG/ML IV SOLN: 1.0000 mg | INTRAVENOUS | Status: DC | PRN

## 2020-11-15 MED ORDER — DIPHENHYDRAMINE HCL 12.5 MG/5ML PO ELIX: 12.5000 mg | ORAL_SOLUTION | Freq: Four times a day (QID) | ORAL | Status: DC | PRN

## 2020-11-15 MED ORDER — ONDANSETRON 4 MG PO TBDP: 4.0000 mg | ORAL_TABLET | Freq: Four times a day (QID) | ORAL | Status: DC | PRN

## 2020-11-15 MED ORDER — MORPHINE SULFATE (PF) 4 MG/ML IV SOLN
4.0000 mg | Freq: Once | INTRAVENOUS | Status: AC
  Administered 2020-11-15: 4 mg via INTRAVENOUS
  Filled 2020-11-15: qty 1

## 2020-11-15 MED ORDER — ONDANSETRON HCL 4 MG/2ML IJ SOLN
4.0000 mg | Freq: Once | INTRAMUSCULAR | Status: AC
  Administered 2020-11-15: 4 mg via INTRAVENOUS
  Filled 2020-11-15: qty 2

## 2020-11-15 MED ORDER — PIPERACILLIN-TAZOBACTAM 3.375 G IVPB 30 MIN
3.3750 g | Freq: Once | INTRAVENOUS | Status: AC
  Administered 2020-11-15: 3.375 g via INTRAVENOUS
  Filled 2020-11-15: qty 50

## 2020-11-15 NOTE — Telephone Encounter (Signed)
Patient has been in the ED since 10:00 pm last night.  She is having abdominal pain.  She is back in the lobby because they are full and "backed up".  Her LFTs are elevated and she has possible cholecystitis "indeterminate on RUQ [place]. She is scheduled to see Dr. Fuller Plan tomorrow.  She asks if she can be seen today.  She is advised that unfortunately Dr. Fuller Plan is not in today.  She is in a great deal of pain.  She is encouraged to ask the staff to get her some pain meds. She will let us know if she remains in the ED or admitted, otherwise she will keep her appointment for tomorrow

## 2020-11-15 NOTE — ED Provider Notes (Signed)
[facility] EMERGENCY DEPARTMENT Provider Note   CSN: [id number] Arrival date & time: 11/14/20  2055     History Chief Complaint  Patient presents with   Abdominal Pain    Colleen Coffey is a 70 y.o. female.  HPI  70 year old female past medical history of HTN presents the emergency department with upper abdominal pain.  Patient states that she has had this intermittently for a long time however over the last couple weeks it has become persistent, worsening in severity.  Now associated with nausea/vomiting.  Patient endorses intermittent fever/chills and loose stools.  Denies any chest pain or shortness of breath.  Past Medical History:  Diagnosis Date   Constipation    chronic, improved with healthy diet   Endocervical polyp    GERD (gastroesophageal reflux disease)    Hemorrhoids    resolved   Hypertension    Osteoarthritis, knee 06/27/2015   -[facility]    Osteopenia 06/27/2015   -reports on medication remotely with gyn     Patient Active Problem List   Diagnosis Date Noted   Hypertension 04/27/2020   Osteoarthritis, knee 06/27/2015   BMI 28.0-28.9,adult 06/27/2015   Constipation 06/27/2015   Osteopenia 06/27/2015    Past Surgical History:  Procedure Laterality Date   APPENDECTOMY     BACK SURGERY  2014   bulging disc lumbar spine   BREAST CYST ASPIRATION Right    CESAREAN SECTION     2     OB History     Gravida  2   Para  2   Term  2   Preterm      AB      Living         SAB      IAB      Ectopic      Multiple      Live Births              Family History  Problem Relation Age of Onset   Hyperlipidemia Other    Hypertension Other    Depression Other    High blood pressure Mother    High Cholesterol Mother    Dementia Mother    Arthritis Mother    Heart disease Mother    Syncope episode Mother    High blood pressure Father    COPD Father    High blood pressure Sister    COPD  Brother    High blood pressure Brother    High blood pressure Maternal Grandmother    High Cholesterol Maternal Grandmother    Dementia Maternal Grandmother    Arthritis Maternal Grandmother    Parkinson's disease Sister    Arthritis Sister    Esophageal cancer Neg Hx    Colon cancer Neg Hx    Pancreatic cancer Neg Hx    Stomach cancer Neg Hx    Liver disease Neg Hx     Social History   Tobacco Use   Smoking status: Never   Smokeless tobacco: Never  Vaping Use   Vaping Use: Never used  Substance Use Topics   Alcohol use: No   Drug use: No    Home Medications Prior to Admission medications   Medication Sig Start Date End Date Taking? Authorizing Provider  amLODipine (NORVASC) 2.5 MG tablet TAKE 1 TABLET BY MOUTH EVERY DAY *NEED APPT* 05/30/20   Caren Macadam, MD  Cholecalciferol (VITAMIN D3 PO) Take 1,000 Units by mouth.    [provider]  Plecanatide (TRULANCE) 3 MG TABS Take 1 tablet by mouth daily. Please schedule a yearly follow up appointment. Thank you 09/20/20   Ladene Artist, MD  tizanidine (ZANAFLEX) 2 MG capsule TAKE 1-2 CAPSULES (2-4 MG TOTAL) BY MOUTH AT BEDTIME AS NEEDED FOR MUSCLE SPASMS. 06/22/20   Caren Macadam, MD    Allergies    Patient has no known allergies.  Review of Systems   Review of Systems  Constitutional:  Positive for chills, fatigue and fever.  HENT:  Negative for congestion.   Eyes:  Negative for visual disturbance.  Respiratory:  Negative for shortness of breath.   Cardiovascular:  Negative for chest pain.  Gastrointestinal:  Positive for abdominal pain, diarrhea, nausea and vomiting.  Genitourinary:  Negative for dysuria.  Skin:  Negative for rash.  Neurological:  Negative for headaches.   Physical Exam Updated Vital Signs BP (!) 168/63 (BP Location: Left Arm)   Pulse 73   Temp 98.9 F (37.2 C) (Oral)   Resp 16   LMP 01/11/2017 (Exact Date)   SpO2 97%   Physical Exam Vitals and nursing note reviewed.   Constitutional:      Appearance: Normal appearance.  HENT:     Head: Normocephalic.     Mouth/Throat:     Mouth: Mucous membranes are moist.  Cardiovascular:     Rate and Rhythm: Normal rate.  Pulmonary:     Effort: Pulmonary effort is normal. No respiratory distress.  Abdominal:     Palpations: Abdomen is soft.     Tenderness: There is abdominal tenderness in the epigastric area and periumbilical area. There is no guarding or rebound.  Skin:    General: Skin is warm.  Neurological:     Mental Status: She is alert and oriented to person, place, and time. Mental status is at baseline.  Psychiatric:        Mood and Affect: Mood normal.    ED Results / Procedures / Treatments   Labs (all labs ordered are listed, but only abnormal results are displayed) Labs Reviewed  COMPREHENSIVE METABOLIC PANEL - Abnormal; Notable for the following components:      Result Value   Potassium 3.0 (*)    Glucose, Bld 150 (*)    AST 811 (*)    ALT 450 (*)    Alkaline Phosphatase 153 (*)    Total Bilirubin 1.3 (*)    All other components within normal limits  CBC WITH DIFFERENTIAL/PLATELET - Abnormal; Notable for the following components:   Platelets 403 (*)    All other components within normal limits  URINALYSIS, ROUTINE W REFLEX MICROSCOPIC - Abnormal; Notable for the following components:   Ketones, ur 5 (*)    All other components within normal limits  RESP PANEL BY RT-PCR (FLU A&B, COVID) ARPGX2  LIPASE, BLOOD    EKG None  Radiology CT ABDOMEN PELVIS W CONTRAST  Result Date: 11/14/2020 CLINICAL DATA:  Abdomen pain EXAM: CT ABDOMEN AND PELVIS WITH CONTRAST TECHNIQUE: Multidetector CT imaging of the abdomen and pelvis was performed using the standard protocol following bolus administration of intravenous contrast. CONTRAST:  69mL OMNIPAQUE IOHEXOL 350 MG/ML SOLN COMPARISON:  None FINDINGS: Lower chest: Lung bases demonstrate no acute consolidation or effusion. Normal cardiac size.  Hepatobiliary: Low-density filling defects in the gallbladder suggestive of stones. Suspicion of mild gallbladder wall thickening at the fundus. No biliary dilatation. Pancreas: Unremarkable. No pancreatic ductal dilatation or surrounding inflammatory changes. Spleen: Normal in size without focal  abnormality. Adrenals/Urinary Tract: Adrenal glands are within normal limits. Kidneys show no hydronephrosis. Cyst in the midpole of the left kidney. The bladder is unremarkable Stomach/Bowel: Stomach is within normal limits. Status post appendectomy. No evidence of bowel wall thickening, distention, or inflammatory changes. Vascular/Lymphatic: Mild aortic atherosclerosis. No aneurysm. No suspicious nodes. Reproductive: Uterus and bilateral adnexa are unremarkable. Other: Negative for pelvic effusion or free air. Small fat containing umbilical and supraumbilical ventral hernias. Musculoskeletal: Postsurgical changes at L5-S1. No acute osseous abnormality IMPRESSION: 1. Suspicion of gallstones. Possible mild wall thickening at gallbladder fundus. Recommend correlation with right upper quadrant ultrasound. 2. Small fat containing ventral and umbilical hernias Electronically Signed   By: Donavan Foil M.D.   On: 11/14/2020 23:16   US Abdomen Limited  Result Date: 11/14/2020 CLINICAL DATA:  Abnormal CT EXAM: ULTRASOUND ABDOMEN LIMITED RIGHT UPPER QUADRANT COMPARISON:  CT 11/14/2020 FINDINGS: Gallbladder: Sludge and shadowing stones in the gallbladder. Slight increased wall thickness at 3.6 mm. Unable to assess sonographic Percell Miller due to pain medicine. Common bile duct: Diameter: 5 mm Liver: No focal lesion identified. Within normal limits in parenchymal echogenicity. Portal vein is patent on color Doppler imaging with normal direction of blood flow towards the liver. Other: None. IMPRESSION: Sludge and stones in the gallbladder with slight increased wall thickness. Unable to assess sonographic Murphy. Findings are  indeterminate for cholecystitis; further evaluation with nuclear medicine hepatobiliary imaging could be obtained if clinical symptoms are suggestive of acute gallbladder disease. Electronically Signed   By: Donavan Foil M.D.   On: 11/14/2020 23:59    Procedures Procedures   Medications Ordered in ED Medications  oxyCODONE-acetaminophen (PERCOCET/ROXICET) 5-325 MG per tablet 1 tablet (1 tablet Oral Given 11/14/20 2127)  ondansetron (ZOFRAN-ODT) disintegrating tablet 4 mg (4 mg Oral Given 11/14/20 2128)  iohexol (OMNIPAQUE) 350 MG/ML injection 75 mL (75 mLs Intravenous Contrast Given 11/14/20 2308)  piperacillin-tazobactam (ZOSYN) IVPB 3.375 g (3.375 g Intravenous New Bag/Given 11/15/20 1203)  morphine 4 MG/ML injection 4 mg (4 mg Intravenous Given 11/15/20 1225)  ondansetron (ZOFRAN) injection 4 mg (4 mg Intravenous Given 11/15/20 1225)    ED Course  I have reviewed the triage vital signs and the nursing notes.  Pertinent labs & imaging results that were available during my care of the patient were reviewed by me and considered in my medical decision making (see chart for details).    MDM Rules/Calculators/A&P                           70 year old female presents emergency department with abdominal pain, nausea/vomiting.  Afebrile on arrival, stable vitals.  Her abdomen is tender mainly in the epigastric and periumbilical area with some right upper quadrant tenderness.  Blood work shows transaminitis with an elevated bilirubin.  Normal white count.  CT and ultrasound show gallstones with sludge, wall thickening concerning for cholecystitis.  General surgery has been consulted and will admit for possible cholecystectomy.  Patients evaluation and results requires admission for further treatment and care. Patient agrees with admission plan, offers no new complaints and is stable/unchanged at time of admit.  Final Clinical Impression(s) / ED Diagnoses Final diagnoses:  None    Rx / DC  Orders ED Discharge Orders     None        Lorelle Gibbs, DO 11/15/20 1422

## 2020-11-15 NOTE — Telephone Encounter (Signed)
Patient is requesting a call back from [place]. I asked to take a message but patient declined.  Patient could be contacted at [phone].  Please advise.

## 2020-11-15 NOTE — Telephone Encounter (Signed)
Spoke with the patient and she stated she is currently at the ER due to severe pain, has been there almost all night due to pain and questioned what we could do and if her test results were in.  I advised the patient unfortunately the ER is very full these days due to so many sick patients, wait times can be long and this is best place for her to be for evaluation at this time for results.

## 2020-11-15 NOTE — Telephone Encounter (Signed)
Patient called requesting to speak with Dr. Fuller Plan said she is currently at ED and is waiting for test results not sure when she will see a doctor also stated she has an appointment to see Dr. Fuller Plan for tomorrow and is asking if he can see her at the hospital today.

## 2020-11-15 NOTE — H&P (Addendum)
[facility] Admission Note  Colleen Coffey [facility] [birth year]  [id number].    Requesting MD: Lavenia Atlas Chief Complaint/Reason for Consult: cholecystitis  HPI:  Colleen Coffey is a 70yo female PMH HTN who presented to [facility] yesterday with worsening abdominal pain. States that her symptoms have been gradually getting worse over the last 2 weeks, and became most severe last night. Pain is in the epigastrium and radiates into RUQ and into her back. Associated with nausea, no emesis or fevers. Worse with PO intake. Tried tylenol which helped a little. States that she has had similar symptoms over the last year and thought they were from a stomach ulcer; previous episodes were never this severe. Patient was worked up by the [facility]. U/s shows sludge and stones in the gallbladder with slight increased wall thickness. WBC 9.7. LFTs elevated with AST 811, ALT 450, Alk phos 153, Tbili 1.3. Lipase WNL 25. General surgery asked to see.  Abdominal surgical history: c section x2, appendectomy, back surgery with anterior approach Anticoagulants: none Nonsmoker Denies alcohol or illicit drug use Employment: retired Lives at home alone  Review of Systems  Constitutional: Negative.   Respiratory: Negative.    Cardiovascular: Negative.   Gastrointestinal:  Positive for abdominal pain and nausea. Negative for vomiting.  Genitourinary: Negative.   Musculoskeletal:  Positive for back pain.   All systems reviewed and otherwise negative except for as above  Family History  Problem Relation Age of Onset   Hyperlipidemia Other    Hypertension Other    Depression Other    High blood pressure Mother    High Cholesterol Mother    Dementia Mother    Arthritis Mother    Heart disease Mother    Syncope episode Mother    High blood pressure Father    COPD Father    High blood pressure Sister    COPD Brother    High blood pressure Brother    High blood pressure Maternal Grandmother     High Cholesterol Maternal Grandmother    Dementia Maternal Grandmother    Arthritis Maternal Grandmother    Parkinson's disease Sister    Arthritis Sister    Esophageal cancer Neg Hx    Colon cancer Neg Hx    Pancreatic cancer Neg Hx    Stomach cancer Neg Hx    Liver disease Neg Hx     Past Medical History:  Diagnosis Date   Constipation    chronic, improved with healthy diet   Endocervical polyp    GERD (gastroesophageal reflux disease)    Hemorrhoids    resolved   Hypertension    Osteoarthritis, knee 06/27/2015   -saw [facility]    Osteopenia 06/27/2015   -reports on medication remotely with gyn     Past Surgical History:  Procedure Laterality Date   APPENDECTOMY     BACK SURGERY  2014   bulging disc lumbar spine   BREAST CYST ASPIRATION Right    CESAREAN SECTION     2    Social History:  reports that she has never smoked. She has never used smokeless tobacco. She reports that she does not drink alcohol and does not use drugs.  Allergies: No Known Allergies  (Not in a hospital admission)   Prior to Admission medications   Medication Sig Start Date End Date Taking? Authorizing Provider  amLODipine (NORVASC) 2.5 MG tablet TAKE 1 TABLET BY MOUTH EVERY DAY *NEED APPT* 05/30/20   Caren Macadam, MD  Cholecalciferol (  VITAMIN D3 PO) Take 1,000 Units by mouth.    [provider]  Plecanatide (TRULANCE) 3 MG TABS Take 1 tablet by mouth daily. Please schedule a yearly follow up appointment. Thank you 09/20/20   Ladene Artist, MD  tizanidine (ZANAFLEX) 2 MG capsule TAKE 1-2 CAPSULES (2-4 MG TOTAL) BY MOUTH AT BEDTIME AS NEEDED FOR MUSCLE SPASMS. 06/22/20   Koberlein, Steele Berg, MD    Blood pressure (!) 168/63, pulse 73, temperature 98.9 F (37.2 C), temperature source Oral, resp. rate 16, last menstrual period 01/11/2017, SpO2 97 %. Physical Exam: General: pleasant, WD/WN female who is laying in bed in NAD HEENT: head is normocephalic, atraumatic.   Sclera are noninjected.  Pupils equal and round.  Ears and nose without any masses or lesions.  Mouth is pink and moist. Dentition fair Heart: regular, rate, and rhythm.  Normal s1,s2. No obvious murmurs, gallops, or rubs noted.  Palpable pedal pulses bilaterally  Lungs: CTAB, no wheezes, rhonchi, or rales noted.  Respiratory effort nonlabored Abd: vertical incision x2 noted distal to umbilicus with one at midline and one to the left of midline, soft, NT/ND, +BS, no masses, hernias, or organomegaly MS: no BUE/BLE edema, calves soft and nontender Skin: warm and dry with no masses, lesions, or rashes Psych: A&Ox4 with an appropriate affect Neuro: cranial nerves grossly intact, equal strength in BUE/BLE bilaterally, normal speech, thought process intact  Results for orders placed or performed during the hospital encounter of 11/14/20 (from the past 48 hour(s))  Urinalysis, Routine w reflex microscopic Urine, Clean Catch     Status: Abnormal   Collection Time: 11/14/20  9:16 PM  Result Value Ref Range   Color, Urine YELLOW YELLOW   APPearance CLEAR CLEAR   Specific Gravity, Urine 1.016 1.005 - 1.030   pH 6.0 5.0 - 8.0   Glucose, UA NEGATIVE NEGATIVE mg/dL   Hgb urine dipstick NEGATIVE NEGATIVE   Bilirubin Urine NEGATIVE NEGATIVE   Ketones, ur 5 (A) NEGATIVE mg/dL   Protein, ur NEGATIVE NEGATIVE mg/dL   Nitrite NEGATIVE NEGATIVE   Leukocytes,Ua NEGATIVE NEGATIVE    Comment: Performed at [facility], [address] [place], [place] [address]  Comprehensive metabolic panel     Status: Abnormal   Collection Time: 11/14/20  9:25 PM  Result Value Ref Range   Sodium 139 135 - 145 mmol/L   Potassium 3.0 (L) 3.5 - 5.1 mmol/L   Chloride 102 98 - 111 mmol/L   CO2 28 22 - 32 mmol/L   Glucose, Bld 150 (H) 70 - 99 mg/dL    Comment: Glucose reference range applies only to samples taken after fasting for at least 8 hours.   BUN 9 8 - 23 mg/dL   Creatinine, Ser 0.80 0.44 - 1.00 mg/dL   Calcium  9.2 8.9 - 10.3 mg/dL   Total Protein 7.3 6.5 - 8.1 g/dL   Albumin 3.6 3.5 - 5.0 g/dL   AST 811 (H) 15 - 41 U/L   ALT 450 (H) 0 - 44 U/L   Alkaline Phosphatase 153 (H) 38 - 126 U/L   Total Bilirubin 1.3 (H) 0.3 - 1.2 mg/dL   GFR, Estimated >60 >60 mL/min    Comment: (NOTE) Calculated using the CKD-EPI Creatinine Equation (2021)    Anion gap 9 5 - 15    Comment: Performed at [facility], [place] [address], [place], [place] [address]  Lipase, blood     Status: None   Collection Time:  11/14/20  9:25 PM  Result Value Ref Range   Lipase 25 11 - 51 U/L    Comment: Performed at [facility], [place] [address] [place], [place] [address]  CBC with Diff     Status: Abnormal   Collection Time: 11/14/20  9:25 PM  Result Value Ref Range   WBC 9.7 4.0 - 10.5 K/uL   RBC 4.41 3.87 - 5.11 MIL/uL   Hemoglobin 12.7 12.0 - 15.0 g/dL   HCT 38.2 36.0 - 46.0 %   MCV 86.6 80.0 - 100.0 fL   MCH 28.8 26.0 - 34.0 pg   MCHC 33.2 30.0 - 36.0 g/dL   RDW 13.0 11.5 - 15.5 %   Platelets 403 (H) 150 - 400 K/uL   nRBC 0.0 0.0 - 0.2 %   Neutrophils Relative % 76 %   Neutro Abs 7.3 1.7 - 7.7 K/uL   Lymphocytes Relative 16 %   Lymphs Abs 1.5 0.7 - 4.0 K/uL   Monocytes Relative 8 %   Monocytes Absolute 0.8 0.1 - 1.0 K/uL   Eosinophils Relative 0 %   Eosinophils Absolute 0.0 0.0 - 0.5 K/uL   Basophils Relative 0 %   Basophils Absolute 0.0 0.0 - 0.1 K/uL   Immature Granulocytes 0 %   Abs Immature Granulocytes 0.03 0.00 - 0.07 K/uL    Comment: Performed at [facility] [address] [place], [place] [address]  Resp Panel by RT-PCR (Flu A&B, Covid) Nasopharyngeal Swab     Status: None   Collection Time: 11/15/20 11:12 AM   Specimen: Nasopharyngeal Swab; Nasopharyngeal(NP) swabs in vial transport medium  Result Value Ref Range   SARS Coronavirus 2 by RT PCR NEGATIVE NEGATIVE    Comment: (NOTE) SARS-CoV-2 target nucleic acids are NOT DETECTED.  The SARS-CoV-2 RNA is generally detectable in  upper respiratory specimens during the acute phase of infection. The lowest concentration of SARS-CoV-2 viral copies this assay can detect is 138 copies/mL. A negative result does not preclude SARS-Cov-2 infection and should not be used as the sole basis for treatment or other patient management decisions. A negative result may occur with  improper specimen collection/handling, submission of specimen other than nasopharyngeal swab, presence of viral mutation(s) within the areas targeted by this assay, and inadequate number of viral copies(<138 copies/mL). A negative result must be combined with clinical observations, patient history, and epidemiological information. The expected result is Negative.  Fact Sheet for Patients:  EntrepreneurPulse.com.au  Fact Sheet for Healthcare Providers:  IncredibleEmployment.be  This test is no t yet approved or cleared by the [place] FDA and  has been authorized for detection and/or diagnosis of SARS-CoV-2 by FDA under an Emergency Use Authorization (EUA). This EUA will remain  in effect (meaning this test can be used) for the duration of the COVID-19 declaration under Section 564(b)(1) of the Act, 21 U.S.C.section 360bbb-3(b)(1), unless the authorization is terminated  or revoked sooner.       Influenza A by PCR NEGATIVE NEGATIVE   Influenza B by PCR NEGATIVE NEGATIVE    Comment: (NOTE) The Xpert Xpress SARS-CoV-2/FLU/RSV plus assay is intended as an aid in the diagnosis of influenza from Nasopharyngeal swab specimens and should not be used as a sole basis for treatment. Nasal washings and aspirates are unacceptable for Xpert Xpress SARS-CoV-2/FLU/RSV testing.  Fact Sheet for Patients: EntrepreneurPulse.com.au  Fact Sheet for Healthcare Providers: IncredibleEmployment.be  This test is not yet approved or cleared by the [place] and  has been authorized  for detection and/or diagnosis of SARS-CoV-2 by FDA under an Emergency Use Authorization (EUA). This EUA will remain in effect (meaning this test can be used) for the duration of the COVID-19 declaration under Section 564(b)(1) of the Act, 21 U.S.C. section 360bbb-3(b)(1), unless the authorization is terminated or revoked.  Performed at [facility], [place] [address] [place], [place] [address]    CT ABDOMEN PELVIS W CONTRAST  Result Date: 11/14/2020 CLINICAL DATA:  Abdomen pain EXAM: CT ABDOMEN AND PELVIS WITH CONTRAST TECHNIQUE: Multidetector CT imaging of the abdomen and pelvis was performed using the standard protocol following bolus administration of intravenous contrast. CONTRAST:  43m OMNIPAQUE IOHEXOL 350 MG/ML SOLN COMPARISON:  None FINDINGS: Lower chest: Lung bases demonstrate no acute consolidation or effusion. Normal cardiac size. Hepatobiliary: Low-density filling defects in the gallbladder suggestive of stones. Suspicion of mild gallbladder wall thickening at the fundus. No biliary dilatation. Pancreas: Unremarkable. No pancreatic ductal dilatation or surrounding inflammatory changes. Spleen: Normal in size without focal abnormality. Adrenals/Urinary Tract: Adrenal glands are within normal limits. Kidneys show no hydronephrosis. Cyst in the midpole of the left kidney. The bladder is unremarkable Stomach/Bowel: Stomach is within normal limits. Status post appendectomy. No evidence of bowel wall thickening, distention, or inflammatory changes. Vascular/Lymphatic: Mild aortic atherosclerosis. No aneurysm. No suspicious nodes. Reproductive: Uterus and bilateral adnexa are unremarkable. Other: Negative for pelvic effusion or free air. Small fat containing umbilical and supraumbilical ventral hernias. Musculoskeletal: Postsurgical changes at L5-S1. No acute osseous abnormality IMPRESSION: 1. Suspicion of gallstones. Possible mild wall thickening at gallbladder fundus. Recommend correlation  with right upper quadrant ultrasound. 2. Small fat containing ventral and umbilical hernias Electronically Signed   By: KDonavan FoilM.D.   On: 11/14/2020 23:16   UKoreaAbdomen Limited  Result Date: 11/14/2020 CLINICAL DATA:  Abnormal CT EXAM: ULTRASOUND ABDOMEN LIMITED RIGHT UPPER QUADRANT COMPARISON:  CT 11/14/2020 FINDINGS: Gallbladder: Sludge and shadowing stones in the gallbladder. Slight increased wall thickness at 3.6 mm. Unable to assess sonographic MPercell Millerdue to pain medicine. Common bile duct: Diameter: 5 mm Liver: No focal lesion identified. Within normal limits in parenchymal echogenicity. Portal vein is patent on color Doppler imaging with normal direction of blood flow towards the liver. Other: None. IMPRESSION: Sludge and stones in the gallbladder with slight increased wall thickness. Unable to assess sonographic Murphy. Findings are indeterminate for cholecystitis; further evaluation with nuclear medicine hepatobiliary imaging could be obtained if clinical symptoms are suggestive of acute gallbladder disease. Electronically Signed   By: KDonavan FoilM.D.   On: 11/14/2020 23:59      Assessment/Plan Acute cholecystitis Elevated LFTs - Patient with suspected cholecystitis and elevated LFTs. Bilirubin is 1.3. Will obtain MRCP to evaluate for possible choledocholithiasis. If positive will need GI consult. If negative can proceed with laparoscopic cholecystectomy and possible IOC tomorrow.   ID - rocephin VTE - SCDs, lovenox FEN - IVF, NPO Foley - none  HTN  BWellington Hampshire PA-C [facility] 11/15/2020, 1:58 PM Please see Amion for pager number during day hours 7:00am-4:30pm

## 2020-11-15 NOTE — ED Notes (Signed)
Pt to MRI via stretcher with transport tech. IVF infusing. No acute changes noted. Will continue to monitor. Pt to go to unit after MRI.

## 2020-11-16 ENCOUNTER — Encounter (HOSPITAL_COMMUNITY): Payer: Self-pay

## 2020-11-16 ENCOUNTER — Encounter (HOSPITAL_COMMUNITY)
Admission: EM | Disposition: A | Discharge status: Home / Self Care | Payer: Self-pay | Source: Home / Self Care | Attending: Emergency Medicine

## 2020-11-16 ENCOUNTER — Observation Stay (HOSPITAL_COMMUNITY): Payer: PPO | Admitting: Anesthesiology

## 2020-11-16 ENCOUNTER — Ambulatory Visit: Payer: PPO | Admitting: Gastroenterology

## 2020-11-16 DIAGNOSIS — K8010 Calculus of gallbladder with chronic cholecystitis without obstruction: Secondary | ICD-10-CM | POA: Diagnosis not present

## 2020-11-16 DIAGNOSIS — Z79899 Other long term (current) drug therapy: Secondary | ICD-10-CM | POA: Diagnosis not present

## 2020-11-16 DIAGNOSIS — K8012 Calculus of gallbladder with acute and chronic cholecystitis without obstruction: Secondary | ICD-10-CM | POA: Diagnosis not present

## 2020-11-16 DIAGNOSIS — Z8719 Personal history of other diseases of the digestive system: Secondary | ICD-10-CM | POA: Diagnosis not present

## 2020-11-16 DIAGNOSIS — I10 Essential (primary) hypertension: Secondary | ICD-10-CM | POA: Diagnosis not present

## 2020-11-16 DIAGNOSIS — K219 Gastro-esophageal reflux disease without esophagitis: Secondary | ICD-10-CM | POA: Diagnosis not present

## 2020-11-16 DIAGNOSIS — K8000 Calculus of gallbladder with acute cholecystitis without obstruction: Secondary | ICD-10-CM | POA: Diagnosis not present

## 2020-11-16 DIAGNOSIS — Z20822 Contact with and (suspected) exposure to covid-19: Secondary | ICD-10-CM | POA: Diagnosis not present

## 2020-11-16 HISTORY — PX: CHOLECYSTECTOMY: SHX55

## 2020-11-16 LAB — CBC
HCT: 37.9 % (ref 36.0–46.0)
Hemoglobin: 12.8 g/dL (ref 12.0–15.0)
MCH: 29.2 pg (ref 26.0–34.0)
MCHC: 33.8 g/dL (ref 30.0–36.0)
MCV: 86.3 fL (ref 80.0–100.0)
Platelets: 394 10*3/uL (ref 150–400)
RBC: 4.39 MIL/uL (ref 3.87–5.11)
RDW: 13.5 % (ref 11.5–15.5)
WBC: 12.5 10*3/uL — ABNORMAL HIGH (ref 4.0–10.5)
nRBC: 0 % (ref 0.0–0.2)

## 2020-11-16 LAB — COMPREHENSIVE METABOLIC PANEL
ALT: 335 U/L — ABNORMAL HIGH (ref 0–44)
AST: 208 U/L — ABNORMAL HIGH (ref 15–41)
Albumin: 3.1 g/dL — ABNORMAL LOW (ref 3.5–5.0)
Alkaline Phosphatase: 136 U/L — ABNORMAL HIGH (ref 38–126)
Anion gap: 7 (ref 5–15)
BUN: 10 mg/dL (ref 8–23)
CO2: 29 mmol/L (ref 22–32)
Calcium: 8.7 mg/dL — ABNORMAL LOW (ref 8.9–10.3)
Chloride: 102 mmol/L (ref 98–111)
Creatinine, Ser: 0.86 mg/dL (ref 0.44–1.00)
GFR, Estimated: >60 mL/min (ref >60)
Glucose, Bld: 106 mg/dL — ABNORMAL HIGH (ref 70–99)
Potassium: 4 mmol/L (ref 3.5–5.1)
Sodium: 138 mmol/L (ref 135–145)
Total Bilirubin: 1.5 mg/dL — ABNORMAL HIGH (ref 0.3–1.2)
Total Protein: 6.5 g/dL (ref 6.5–8.1)

## 2020-11-16 LAB — SURGICAL PCR SCREEN
MRSA, PCR: NEGATIVE
Staphylococcus aureus: NEGATIVE

## 2020-11-16 SURGERY — LAPAROSCOPIC CHOLECYSTECTOMY WITH INTRAOPERATIVE CHOLANGIOGRAM
Anesthesia: General | Site: Abdomen

## 2020-11-16 MED ORDER — SODIUM CHLORIDE 0.9 % IR SOLN
Status: DC | PRN
  Administered 2020-11-16: 1000 mL

## 2020-11-16 MED ORDER — OXYCODONE HCL 5 MG PO TABS: 5.0000 mg | ORAL_TABLET | Freq: Once | ORAL | Status: DC | PRN

## 2020-11-16 MED ORDER — LIDOCAINE 2% (20 MG/ML) 5 ML SYRINGE
INTRAMUSCULAR | Status: AC
  Filled 2020-11-16: qty 5

## 2020-11-16 MED ORDER — ORAL CARE MOUTH RINSE: 15.0000 mL | Freq: Once | OROMUCOSAL | Status: AC

## 2020-11-16 MED ORDER — GABAPENTIN 300 MG PO CAPS
300.0000 mg | ORAL_CAPSULE | ORAL | Status: AC
  Administered 2020-11-16: 300 mg via ORAL
  Filled 2020-11-16: qty 1

## 2020-11-16 MED ORDER — ROCURONIUM BROMIDE 10 MG/ML (PF) SYRINGE
PREFILLED_SYRINGE | INTRAVENOUS | Status: DC | PRN
  Administered 2020-11-16: 50 mg via INTRAVENOUS
  Administered 2020-11-16: 20 mg via INTRAVENOUS

## 2020-11-16 MED ORDER — OXYCODONE HCL 5 MG/5ML PO SOLN: 5.0000 mg | Freq: Once | ORAL | Status: DC | PRN

## 2020-11-16 MED ORDER — ONDANSETRON HCL 4 MG/2ML IJ SOLN: 4.0000 mg | Freq: Once | INTRAMUSCULAR | Status: DC | PRN

## 2020-11-16 MED ORDER — DEXAMETHASONE SODIUM PHOSPHATE 10 MG/ML IJ SOLN
INTRAMUSCULAR | Status: DC | PRN
  Administered 2020-11-16: 5 mg via INTRAVENOUS

## 2020-11-16 MED ORDER — BUPIVACAINE-EPINEPHRINE (PF) 0.25% -1:200000 IJ SOLN
INTRAMUSCULAR | Status: AC
  Filled 2020-11-16: qty 30

## 2020-11-16 MED ORDER — LACTATED RINGERS IV SOLN: INTRAVENOUS | Status: DC

## 2020-11-16 MED ORDER — SUGAMMADEX SODIUM 200 MG/2ML IV SOLN
INTRAVENOUS | Status: DC | PRN
  Administered 2020-11-16: 100 mg via INTRAVENOUS
  Administered 2020-11-16: 200 mg via INTRAVENOUS

## 2020-11-16 MED ORDER — FENTANYL CITRATE (PF) 100 MCG/2ML IJ SOLN
INTRAMUSCULAR | Status: AC
  Filled 2020-11-16: qty 2

## 2020-11-16 MED ORDER — DEXAMETHASONE SODIUM PHOSPHATE 10 MG/ML IJ SOLN
INTRAMUSCULAR | Status: AC
  Filled 2020-11-16: qty 1

## 2020-11-16 MED ORDER — FENTANYL CITRATE (PF) 250 MCG/5ML IJ SOLN
INTRAMUSCULAR | Status: AC
  Filled 2020-11-16: qty 5

## 2020-11-16 MED ORDER — CHLORHEXIDINE GLUCONATE 0.12 % MT SOLN
15.0000 mL | Freq: Once | OROMUCOSAL | Status: AC
  Administered 2020-11-16: 15 mL via OROMUCOSAL
  Filled 2020-11-16: qty 15

## 2020-11-16 MED ORDER — ONDANSETRON HCL 4 MG/2ML IJ SOLN
INTRAMUSCULAR | Status: DC | PRN
  Administered 2020-11-16: 4 mg via INTRAVENOUS

## 2020-11-16 MED ORDER — LIDOCAINE 2% (20 MG/ML) 5 ML SYRINGE
INTRAMUSCULAR | Status: DC | PRN
  Administered 2020-11-16: 60 mg via INTRAVENOUS

## 2020-11-16 MED ORDER — 0.9 % SODIUM CHLORIDE (POUR BTL) OPTIME
TOPICAL | Status: DC | PRN
  Administered 2020-11-16: 1000 mL

## 2020-11-16 MED ORDER — PROPOFOL 10 MG/ML IV BOLUS
INTRAVENOUS | Status: AC
  Filled 2020-11-16: qty 20

## 2020-11-16 MED ORDER — FENTANYL CITRATE (PF) 100 MCG/2ML IJ SOLN
25.0000 ug | INTRAMUSCULAR | Status: DC | PRN
  Administered 2020-11-16: 50 ug via INTRAVENOUS

## 2020-11-16 MED ORDER — ACETAMINOPHEN 500 MG PO TABS
1000.0000 mg | ORAL_TABLET | ORAL | Status: AC
  Administered 2020-11-16: 1000 mg via ORAL
  Filled 2020-11-16: qty 2

## 2020-11-16 MED ORDER — PROPOFOL 10 MG/ML IV BOLUS
INTRAVENOUS | Status: DC | PRN
  Administered 2020-11-16: 200 mg via INTRAVENOUS

## 2020-11-16 MED ORDER — ONDANSETRON HCL 4 MG/2ML IJ SOLN
INTRAMUSCULAR | Status: AC
  Filled 2020-11-16: qty 2

## 2020-11-16 MED ORDER — FENTANYL CITRATE (PF) 250 MCG/5ML IJ SOLN
INTRAMUSCULAR | Status: DC | PRN
  Administered 2020-11-16 (×3): 50 ug via INTRAVENOUS
  Administered 2020-11-16: 100 ug via INTRAVENOUS

## 2020-11-16 MED ORDER — BUPIVACAINE-EPINEPHRINE 0.25% -1:200000 IJ SOLN
INTRAMUSCULAR | Status: DC | PRN
  Administered 2020-11-16: 16 mL

## 2020-11-16 MED ORDER — MUPIROCIN 2 % EX OINT
1.0000 "application " | TOPICAL_OINTMENT | Freq: Two times a day (BID) | CUTANEOUS | Status: DC
  Administered 2020-11-16 – 2020-11-17 (×2): 1 via NASAL
  Filled 2020-11-16 (×2): qty 22

## 2020-11-16 MED ORDER — ROCURONIUM BROMIDE 10 MG/ML (PF) SYRINGE
PREFILLED_SYRINGE | INTRAVENOUS | Status: AC
  Filled 2020-11-16: qty 10

## 2020-11-16 SURGICAL SUPPLY — 39 items
APPLIER CLIP 5 13 M/L LIGAMAX5 (MISCELLANEOUS) ×2
BAG COUNTER SPONGE SURGICOUNT (BAG) ×2 IMPLANT
BLADE CLIPPER SURG (BLADE) IMPLANT
CANISTER SUCT 3000ML PPV (MISCELLANEOUS) ×2 IMPLANT
CATH REDDICK CHOLANGI 4FR 50CM (CATHETERS) ×2 IMPLANT
CHLORAPREP W/TINT 26 (MISCELLANEOUS) ×2 IMPLANT
CLIP APPLIE 5 13 M/L LIGAMAX5 (MISCELLANEOUS) ×1 IMPLANT
CNTNR URN SCR LID CUP LEK RST (MISCELLANEOUS) IMPLANT
CONT SPEC 4OZ STRL OR WHT (MISCELLANEOUS) ×2
COVER MAYO STAND STRL (DRAPES) ×2 IMPLANT
COVER SURGICAL LIGHT HANDLE (MISCELLANEOUS) ×2 IMPLANT
DERMABOND ADVANCED (GAUZE/BANDAGES/DRESSINGS) ×1
DERMABOND ADVANCED .7 DNX12 (GAUZE/BANDAGES/DRESSINGS) ×1 IMPLANT
DRAPE C-ARM 42X120 X-RAY (DRAPES) ×2 IMPLANT
ELECT REM PT RETURN 9FT ADLT (ELECTROSURGICAL) ×2
ELECTRODE REM PT RTRN 9FT ADLT (ELECTROSURGICAL) ×1 IMPLANT
GLOVE SURG ENC MOIS LTX SZ7.5 (GLOVE) ×2 IMPLANT
GOWN STRL REUS W/ TWL LRG LVL3 (GOWN DISPOSABLE) ×3 IMPLANT
GOWN STRL REUS W/TWL LRG LVL3 (GOWN DISPOSABLE) ×6
IV CATH 14GX2 1/4 (CATHETERS) ×2 IMPLANT
KIT BASIN OR (CUSTOM PROCEDURE TRAY) ×2 IMPLANT
KIT TURNOVER KIT B (KITS) ×2 IMPLANT
NS IRRIG 1000ML POUR BTL (IV SOLUTION) ×2 IMPLANT
PAD ARMBOARD 7.5X6 YLW CONV (MISCELLANEOUS) ×2 IMPLANT
POUCH RETRIEVAL ECOSAC 10 (ENDOMECHANICALS) IMPLANT
POUCH RETRIEVAL ECOSAC 10MM (ENDOMECHANICALS) ×2
POUCH SPECIMEN RETRIEVAL 10MM (ENDOMECHANICALS) ×2 IMPLANT
SCISSORS LAP 5X35 DISP (ENDOMECHANICALS) ×2 IMPLANT
SET IRRIG TUBING LAPAROSCOPIC (IRRIGATION / IRRIGATOR) ×2 IMPLANT
SET TUBE SMOKE EVAC HIGH FLOW (TUBING) ×2 IMPLANT
SLEEVE ENDOPATH XCEL 5M (ENDOMECHANICALS) ×4 IMPLANT
SPECIMEN JAR SMALL (MISCELLANEOUS) ×2 IMPLANT
SUT MNCRL AB 4-0 PS2 18 (SUTURE) ×2 IMPLANT
TOWEL GREEN STERILE (TOWEL DISPOSABLE) ×2 IMPLANT
TOWEL GREEN STERILE FF (TOWEL DISPOSABLE) ×2 IMPLANT
TRAY LAPAROSCOPIC MC (CUSTOM PROCEDURE TRAY) ×2 IMPLANT
TROCAR XCEL BLUNT TIP 100MML (ENDOMECHANICALS) ×2 IMPLANT
TROCAR XCEL NON-BLD 5MMX100MML (ENDOMECHANICALS) ×2 IMPLANT
WATER STERILE IRR 1000ML POUR (IV SOLUTION) ×2 IMPLANT

## 2020-11-16 NOTE — Anesthesia Postprocedure Evaluation (Signed)
Anesthesia Post Note  Patient: Shaquavia Whisonant Hemenway  Procedure(s) Performed: LAPAROSCOPIC CHOLECYSTECTOMY (Abdomen)     Patient location during evaluation: PACU Anesthesia Type: General Level of consciousness: awake and alert Pain management: pain level controlled Vital Signs Assessment: post-procedure vital signs reviewed and stable Respiratory status: spontaneous breathing, nonlabored ventilation and respiratory function stable Cardiovascular status: stable and blood pressure returned to baseline Anesthetic complications: no   No notable events documented.  Last Vitals:  Vitals:   11/16/20 1236 11/16/20 1255  BP: (!) 110/49 (!) 116/55  Pulse: 73 71  Resp: 14 14  Temp: 36.6 C 36.8 C  SpO2: 95% 97%    Last Pain:  Vitals:   11/16/20 1255  TempSrc: Oral  PainSc:                  Audry Pili

## 2020-11-16 NOTE — Interval H&P Note (Signed)
History and Physical Interval Note:  11/16/2020 9:41 AM  Colleen Coffey  has presented today for surgery, with the diagnosis of Cholecystitis.  The various methods of treatment have been discussed with the patient and family. After consideration of risks, benefits and other options for treatment, the patient has consented to  Procedure(s): LAPAROSCOPIC CHOLECYSTECTOMY WITH INTRAOPERATIVE CHOLANGIOGRAM (N/A) as a surgical intervention.  The patient's history has been reviewed, patient examined, no change in status, stable for surgery.  I have reviewed the patient's chart and labs.  Questions were answered to the patient's satisfaction.     [name]

## 2020-11-16 NOTE — Transfer of Care (Signed)
Immediate Anesthesia Transfer of Care Note  Patient: Erryn Dickison Razzano  Procedure(s) Performed: LAPAROSCOPIC CHOLECYSTECTOMY (Abdomen)  Patient Location: PACU  Anesthesia Type:General  Level of Consciousness: drowsy  Airway & Oxygen Therapy: Patient Spontanous Breathing  Post-op Assessment: Report given to RN and Post -op Vital signs reviewed and stable  Post vital signs: Reviewed and stable  Last Vitals:  Vitals Value Taken Time  BP 127/50 11/16/20 1152  Temp    Pulse 87 11/16/20 1154  Resp 14 11/16/20 1154  SpO2 97 % 11/16/20 1154  Vitals shown include unvalidated device data.  Last Pain:  Vitals:   11/16/20 0933  TempSrc: (P) Oral  PainSc:          Complications: No notable events documented.

## 2020-11-16 NOTE — H&P (View-Only) (Signed)
Subjective/Chief Complaint: Feels better   Objective: Vital signs in last 24 hours: Temp:  [98.3 F (36.8 C)-98.9 F (37.2 C)] 98.3 F (36.8 C) (10/05 0749) Pulse Rate:  [70-85] 85 (10/05 0749) Resp:  [16-19] 19 (10/05 0749) BP: (149-168)/(63-71) 150/65 (10/05 0749) SpO2:  [97 %-99 %] 98 % (10/05 0749) Weight:  [76.5 kg] 76.5 kg (10/05 0013) Last BM Date: 11/14/20  Intake/Output from previous day: 10/04 0701 - 10/05 0700 In: 1286.4 [I.V.:1136.4; IV Piggyback:150] Out: -  Intake/Output this shift: No intake/output data recorded.  General appearance: alert and cooperative Resp: clear to auscultation bilaterally Cardio: regular rate and rhythm GI: soft, mild RUQ tenderness  Lab Results:  Recent Labs    11/14/20 2125 11/16/20 0115  WBC 9.7 12.5*  HGB 12.7 12.8  HCT 38.2 37.9  PLT 403* 394   BMET Recent Labs    11/14/20 2125 11/16/20 0115  NA 139 138  K 3.0* 4.0  CL 102 102  CO2 28 29  GLUCOSE 150* 106*  BUN 9 10  CREATININE 0.80 0.86  CALCIUM 9.2 8.7*   PT/INR No results for input(s): LABPROT, INR in the last 72 hours. ABG No results for input(s): PHART, HCO3 in the last 72 hours.  Invalid input(s): PCO2, PO2  Studies/Results: CT ABDOMEN PELVIS W CONTRAST  Result Date: 11/14/2020 CLINICAL DATA:  Abdomen pain EXAM: CT ABDOMEN AND PELVIS WITH CONTRAST TECHNIQUE: Multidetector CT imaging of the abdomen and pelvis was performed using the standard protocol following bolus administration of intravenous contrast. CONTRAST:  52mL OMNIPAQUE IOHEXOL 350 MG/ML SOLN COMPARISON:  None FINDINGS: Lower chest: Lung bases demonstrate no acute consolidation or effusion. Normal cardiac size. Hepatobiliary: Low-density filling defects in the gallbladder suggestive of stones. Suspicion of mild gallbladder wall thickening at the fundus. No biliary dilatation. Pancreas: Unremarkable. No pancreatic ductal dilatation or surrounding inflammatory changes. Spleen: Normal in size  without focal abnormality. Adrenals/Urinary Tract: Adrenal glands are within normal limits. Kidneys show no hydronephrosis. Cyst in the midpole of the left kidney. The bladder is unremarkable Stomach/Bowel: Stomach is within normal limits. Status post appendectomy. No evidence of bowel wall thickening, distention, or inflammatory changes. Vascular/Lymphatic: Mild aortic atherosclerosis. No aneurysm. No suspicious nodes. Reproductive: Uterus and bilateral adnexa are unremarkable. Other: Negative for pelvic effusion or free air. Small fat containing umbilical and supraumbilical ventral hernias. Musculoskeletal: Postsurgical changes at L5-S1. No acute osseous abnormality IMPRESSION: 1. Suspicion of gallstones. Possible mild wall thickening at gallbladder fundus. Recommend correlation with right upper quadrant ultrasound. 2. Small fat containing ventral and umbilical hernias Electronically Signed   By: Donavan Foil M.D.   On: 11/14/2020 23:16   US Abdomen Limited  Result Date: 11/14/2020 CLINICAL DATA:  Abnormal CT EXAM: ULTRASOUND ABDOMEN LIMITED RIGHT UPPER QUADRANT COMPARISON:  CT 11/14/2020 FINDINGS: Gallbladder: Sludge and shadowing stones in the gallbladder. Slight increased wall thickness at 3.6 mm. Unable to assess sonographic Percell Miller due to pain medicine. Common bile duct: Diameter: 5 mm Liver: No focal lesion identified. Within normal limits in parenchymal echogenicity. Portal vein is patent on color Doppler imaging with normal direction of blood flow towards the liver. Other: None. IMPRESSION: Sludge and stones in the gallbladder with slight increased wall thickness. Unable to assess sonographic Murphy. Findings are indeterminate for cholecystitis; further evaluation with nuclear medicine hepatobiliary imaging could be obtained if clinical symptoms are suggestive of acute gallbladder disease. Electronically Signed   By: Donavan Foil M.D.   On: 11/14/2020 23:59   MR ABDOMEN MRCP W WO  CONTAST  Result  Date: 11/16/2020 CLINICAL DATA:  70 year old female with history of jaundice. EXAM: MRI ABDOMEN WITHOUT AND WITH CONTRAST (INCLUDING MRCP) TECHNIQUE: Multiplanar multisequence MR imaging of the abdomen was performed both before and after the administration of intravenous contrast. Heavily T2-weighted images of the biliary and pancreatic ducts were obtained, and three-dimensional MRCP images were rendered by post processing. CONTRAST:  7.62mL GADAVIST GADOBUTROL 1 MMOL/ML IV SOLN COMPARISON:  No prior abdominal MRI. CT the abdomen and pelvis 11/14/2020. Abdominal ultrasound 11/14/2020. FINDINGS: Lower chest: Unremarkable. Hepatobiliary: Mild diffuse loss of signal intensity throughout the hepatic parenchyma on out of phase dual echo images, indicative of a background of hepatic steatosis. No suspicious cystic or solid hepatic lesions. No intra or extrahepatic biliary ductal dilatation noted on MRCP images. Common bile duct measures 5 mm in the porta hepatis. No filling defects in the common bile duct to suggest choledocholithiasis. Large filling defects are noted within the gallbladder, large signal voids are noted in the gallbladder measuring up to 2.1 x 2.0 cm in the neck of the gallbladder. Gallbladder is moderately distended. However, gallbladder wall appears thickened (up to 7 mm thick in the fundus), inflamed and edematous with moderate volume of pericholecystic fluid and extensive surrounding inflammatory changes indicative of an acute cholecystitis. Pancreas: In the inferior aspect of the head of the pancreas (axial image 27 of series 3) there is a 3 mm T1 hypointense, T2 hyperintense, nonenhancing lesion. No solid pancreatic mass. No pancreatic ductal dilatation noted on MRCP images. No pancreatic or peripancreatic fluid collections or inflammatory changes. Spleen:  Unremarkable. Adrenals/Urinary Tract: In the posterior aspect of the interpolar region of the left kidney (axial image 19 of series 3) there is a  1.4 cm T1 hypointense, T2 hyperintense, nonenhancing lesion, compatible with a simple cyst. Right kidney and bilateral adrenal glands are normal in appearance. No hydroureteronephrosis in the visualized portions of the abdomen. Stomach/Bowel: Visualized portions are unremarkable. Vascular/Lymphatic: No aneurysm identified in the visualized abdominal vasculature. No lymphadenopathy noted in the abdomen. Other: Moderate volume of pericholecystic fluid. No other larger volume of ascites. Musculoskeletal: No aggressive appearing osseous lesions are noted in the visualized portions of the skeleton. Signal void is evident in the lower lumbar spine from prior PLIF. IMPRESSION: 1. Cholelithiasis with evidence of acute cholecystitis, as detailed above. 2. No choledocholithiasis or findings to suggest biliary tract obstruction. 3. 3 mm cystic lesion in the inferior aspect of the pancreatic head, nonspecific and statistically likely to represent a benign lesions such as focal side branch ectasia, tiny pancreatic pseudocyst, or small side branch IPMN (intraductal papillary mucinous neoplasm). Repeat abdominal MRI with and without IV gadolinium with MRCP is recommended in 1 year to ensure the stability of this finding. This recommendation follows ACR consensus guidelines: Management of Incidental Pancreatic Cysts: A White Paper of the ACR Incidental Findings Committee. [place] 4315;40:[id number]. Preliminary critical value/emergent results were called by telephone at the time of interpretation on 11/15/2020 at 11:55 p.m. to nurse [facility], who verbally acknowledged these results and agreed to notify the on call physician. Electronically Signed   By: Vinnie Langton M.D.   On: 11/16/2020 05:58    Anti-infectives: Anti-infectives (From admission, onward)    Start     Dose/Rate Route Frequency Ordered Stop   11/15/20 2000  cefTRIAXone (ROCEPHIN) 2 g in sodium chloride 0.9 % 100 mL IVPB        2 g 200 mL/hr over 30  Minutes Intravenous Every 24 hours  11/15/20 1412 11/22/20 1959   11/15/20 1115  piperacillin-tazobactam (ZOSYN) IVPB 3.375 g        3.375 g 100 mL/hr over 30 Minutes Intravenous  Once 11/15/20 1111 11/15/20 1233       Assessment/Plan: s/p Procedure(s): LAPAROSCOPIC CHOLECYSTECTOMY WITH INTRAOPERATIVE CHOLANGIOGRAM (N/A) Plan for lap chole with ioc today. Risks and benefits of the surgery including possible cbd injury discussed with patient and she understands and wishes to proceed  LOS: 0 days    Colleen Coffey 11/16/2020

## 2020-11-16 NOTE — Anesthesia Preprocedure Evaluation (Addendum)
Anesthesia Evaluation  Patient identified by MRN, date of birth, ID band Patient awake    Reviewed: Allergy & Precautions, NPO status , Patient's Chart, lab work & pertinent test results  History of Anesthesia Complications Negative for: history of anesthetic complications  Airway Mallampati: III  TM Distance: >3 FB Neck ROM: Full    Dental  (+) Dental Advisory Given, Teeth Intact   Pulmonary neg pulmonary ROS,    Pulmonary exam normal        Cardiovascular hypertension, Pt. on medications Normal cardiovascular exam     Neuro/Psych negative neurological ROS  negative psych ROS   GI/Hepatic  Elevated LFTs   Chronic constipation    Endo/Other   Obesity   Renal/GU negative Renal ROS     Musculoskeletal  (+) Arthritis , Osteoarthritis,    Abdominal   Peds  Hematology negative hematology ROS (+)   Anesthesia Other Findings   Reproductive/Obstetrics                            Anesthesia Physical Anesthesia Plan  ASA: 2  Anesthesia Plan: General   Post-op Pain Management:    Induction: Intravenous  PONV Risk Score and Plan: 4 or greater and Treatment may vary due to age or medical condition, Ondansetron and Dexamethasone  Airway Management Planned: Oral ETT  Additional Equipment: None  Intra-op Plan:   Post-operative Plan: Extubation in OR  Informed Consent: I have reviewed the patients History and Physical, chart, labs and discussed the procedure including the risks, benefits and alternatives for the proposed anesthesia with the patient or authorized representative who has indicated his/her understanding and acceptance.     Dental advisory given  Plan Discussed with: CRNA and Anesthesiologist  Anesthesia Plan Comments:        Anesthesia Quick Evaluation

## 2020-11-16 NOTE — Progress Notes (Signed)
Subjective/Chief Complaint: Feels better   Objective: Vital signs in last 24 hours: Temp:  [98.3 F (36.8 C)-98.9 F (37.2 C)] 98.3 F (36.8 C) (10/05 0749) Pulse Rate:  [70-85] 85 (10/05 0749) Resp:  [16-19] 19 (10/05 0749) BP: (149-168)/(63-71) 150/65 (10/05 0749) SpO2:  [97 %-99 %] 98 % (10/05 0749) Weight:  [76.5 kg] 76.5 kg (10/05 0013) Last BM Date: 11/14/20  Intake/Output from previous day: 10/04 0701 - 10/05 0700 In: 1286.4 [I.V.:1136.4; IV Piggyback:150] Out: -  Intake/Output this shift: No intake/output data recorded.  General appearance: alert and cooperative Resp: clear to auscultation bilaterally Cardio: regular rate and rhythm GI: soft, mild RUQ tenderness  Lab Results:  Recent Labs    11/14/20 2125 11/16/20 0115  WBC 9.7 12.5*  HGB 12.7 12.8  HCT 38.2 37.9  PLT 403* 394   BMET Recent Labs    11/14/20 2125 11/16/20 0115  NA 139 138  K 3.0* 4.0  CL 102 102  CO2 28 29  GLUCOSE 150* 106*  BUN 9 10  CREATININE 0.80 0.86  CALCIUM 9.2 8.7*   PT/INR No results for input(s): LABPROT, INR in the last 72 hours. ABG No results for input(s): PHART, HCO3 in the last 72 hours.  Invalid input(s): PCO2, PO2  Studies/Results: CT ABDOMEN PELVIS W CONTRAST  Result Date: 11/14/2020 CLINICAL DATA:  Abdomen pain EXAM: CT ABDOMEN AND PELVIS WITH CONTRAST TECHNIQUE: Multidetector CT imaging of the abdomen and pelvis was performed using the standard protocol following bolus administration of intravenous contrast. CONTRAST:  72mL OMNIPAQUE IOHEXOL 350 MG/ML SOLN COMPARISON:  None FINDINGS: Lower chest: Lung bases demonstrate no acute consolidation or effusion. Normal cardiac size. Hepatobiliary: Low-density filling defects in the gallbladder suggestive of stones. Suspicion of mild gallbladder wall thickening at the fundus. No biliary dilatation. Pancreas: Unremarkable. No pancreatic ductal dilatation or surrounding inflammatory changes. Spleen: Normal in size  without focal abnormality. Adrenals/Urinary Tract: Adrenal glands are within normal limits. Kidneys show no hydronephrosis. Cyst in the midpole of the left kidney. The bladder is unremarkable Stomach/Bowel: Stomach is within normal limits. Status post appendectomy. No evidence of bowel wall thickening, distention, or inflammatory changes. Vascular/Lymphatic: Mild aortic atherosclerosis. No aneurysm. No suspicious nodes. Reproductive: Uterus and bilateral adnexa are unremarkable. Other: Negative for pelvic effusion or free air. Small fat containing umbilical and supraumbilical ventral hernias. Musculoskeletal: Postsurgical changes at L5-S1. No acute osseous abnormality IMPRESSION: 1. Suspicion of gallstones. Possible mild wall thickening at gallbladder fundus. Recommend correlation with right upper quadrant ultrasound. 2. Small fat containing ventral and umbilical hernias Electronically Signed   By: Donavan Foil M.D.   On: 11/14/2020 23:16   US Abdomen Limited  Result Date: 11/14/2020 CLINICAL DATA:  Abnormal CT EXAM: ULTRASOUND ABDOMEN LIMITED RIGHT UPPER QUADRANT COMPARISON:  CT 11/14/2020 FINDINGS: Gallbladder: Sludge and shadowing stones in the gallbladder. Slight increased wall thickness at 3.6 mm. Unable to assess sonographic Percell Miller due to pain medicine. Common bile duct: Diameter: 5 mm Liver: No focal lesion identified. Within normal limits in parenchymal echogenicity. Portal vein is patent on color Doppler imaging with normal direction of blood flow towards the liver. Other: None. IMPRESSION: Sludge and stones in the gallbladder with slight increased wall thickness. Unable to assess sonographic Murphy. Findings are indeterminate for cholecystitis; further evaluation with nuclear medicine hepatobiliary imaging could be obtained if clinical symptoms are suggestive of acute gallbladder disease. Electronically Signed   By: Donavan Foil M.D.   On: 11/14/2020 23:59   MR ABDOMEN MRCP W WO  CONTAST  Result  Date: 11/16/2020 CLINICAL DATA:  70 year old female with history of jaundice. EXAM: MRI ABDOMEN WITHOUT AND WITH CONTRAST (INCLUDING MRCP) TECHNIQUE: Multiplanar multisequence MR imaging of the abdomen was performed both before and after the administration of intravenous contrast. Heavily T2-weighted images of the biliary and pancreatic ducts were obtained, and three-dimensional MRCP images were rendered by post processing. CONTRAST:  7.56mL GADAVIST GADOBUTROL 1 MMOL/ML IV SOLN COMPARISON:  No prior abdominal MRI. CT the abdomen and pelvis 11/14/2020. Abdominal ultrasound 11/14/2020. FINDINGS: Lower chest: Unremarkable. Hepatobiliary: Mild diffuse loss of signal intensity throughout the hepatic parenchyma on out of phase dual echo images, indicative of a background of hepatic steatosis. No suspicious cystic or solid hepatic lesions. No intra or extrahepatic biliary ductal dilatation noted on MRCP images. Common bile duct measures 5 mm in the porta hepatis. No filling defects in the common bile duct to suggest choledocholithiasis. Large filling defects are noted within the gallbladder, large signal voids are noted in the gallbladder measuring up to 2.1 x 2.0 cm in the neck of the gallbladder. Gallbladder is moderately distended. However, gallbladder wall appears thickened (up to 7 mm thick in the fundus), inflamed and edematous with moderate volume of pericholecystic fluid and extensive surrounding inflammatory changes indicative of an acute cholecystitis. Pancreas: In the inferior aspect of the head of the pancreas (axial image 27 of series 3) there is a 3 mm T1 hypointense, T2 hyperintense, nonenhancing lesion. No solid pancreatic mass. No pancreatic ductal dilatation noted on MRCP images. No pancreatic or peripancreatic fluid collections or inflammatory changes. Spleen:  Unremarkable. Adrenals/Urinary Tract: In the posterior aspect of the interpolar region of the left kidney (axial image 19 of series 3) there is a  1.4 cm T1 hypointense, T2 hyperintense, nonenhancing lesion, compatible with a simple cyst. Right kidney and bilateral adrenal glands are normal in appearance. No hydroureteronephrosis in the visualized portions of the abdomen. Stomach/Bowel: Visualized portions are unremarkable. Vascular/Lymphatic: No aneurysm identified in the visualized abdominal vasculature. No lymphadenopathy noted in the abdomen. Other: Moderate volume of pericholecystic fluid. No other larger volume of ascites. Musculoskeletal: No aggressive appearing osseous lesions are noted in the visualized portions of the skeleton. Signal void is evident in the lower lumbar spine from prior PLIF. IMPRESSION: 1. Cholelithiasis with evidence of acute cholecystitis, as detailed above. 2. No choledocholithiasis or findings to suggest biliary tract obstruction. 3. 3 mm cystic lesion in the inferior aspect of the pancreatic head, nonspecific and statistically likely to represent a benign lesions such as focal side branch ectasia, tiny pancreatic pseudocyst, or small side branch IPMN (intraductal papillary mucinous neoplasm). Repeat abdominal MRI with and without IV gadolinium with MRCP is recommended in 1 year to ensure the stability of this finding. This recommendation follows ACR consensus guidelines: Management of Incidental Pancreatic Cysts: A White Paper of the ACR Incidental Findings Committee. [place] 3220;25:427-062. Preliminary critical value/emergent results were called by telephone at the time of interpretation on 11/15/2020 at 11:55 p.m. to nurse [facility], who verbally acknowledged these results and agreed to notify the on call physician. Electronically Signed   By: Vinnie Langton M.D.   On: 11/16/2020 05:58    Anti-infectives: Anti-infectives (From admission, onward)    Start     Dose/Rate Route Frequency Ordered Stop   11/15/20 2000  cefTRIAXone (ROCEPHIN) 2 g in sodium chloride 0.9 % 100 mL IVPB        2 g 200 mL/hr over 30  Minutes Intravenous Every 24 hours  11/15/20 1412 11/22/20 1959   11/15/20 1115  piperacillin-tazobactam (ZOSYN) IVPB 3.375 g        3.375 g 100 mL/hr over 30 Minutes Intravenous  Once 11/15/20 1111 11/15/20 1233       Assessment/Plan: s/p Procedure(s): LAPAROSCOPIC CHOLECYSTECTOMY WITH INTRAOPERATIVE CHOLANGIOGRAM (N/A) Plan for lap chole with ioc today. Risks and benefits of the surgery including possible cbd injury discussed with patient and she understands and wishes to proceed  LOS: 0 days    [name] 11/16/2020

## 2020-11-16 NOTE — Op Note (Addendum)
11/14/2020 - 11/16/2020  11:26 AM  PATIENT:  Colleen Coffey  70 y.o. female  PRE-OPERATIVE DIAGNOSIS:  CHOLECYSTITIS WITH CHOLELITHIASIS  POST-OPERATIVE DIAGNOSIS:  CHOLECYSTITIS WITH CHOLELITHIASIS  PROCEDURE:  Procedure(s): LAPAROSCOPIC CHOLECYSTECTOMY   SURGEON:  Surgeon(s) and Role:    * Jovita Kussmaul, MD - Primary  PHYSICIAN ASSISTANT:   ASSISTANTS: Barkley Boards, PA   ANESTHESIA:   local and general  EBL:  20cc   BLOOD ADMINISTERED:none  DRAINS: none   LOCAL MEDICATIONS USED:  MARCAINE     SPECIMEN:  Source of Specimen:  gallbladder  DISPOSITION OF SPECIMEN:  PATHOLOGY  COUNTS:  YES  TOURNIQUET:  * No tourniquets in log *  DICTATION: .Dragon Dictation    Procedure: After informed consent was obtained the patient was brought to the operating room and placed in the supine position on the operating room table. After adequate induction of general anesthesia the patient's abdomen was prepped with ChloraPrep allowed to dry and draped in usual sterile manner. An appropriate timeout was performed. The area below the umbilicus was infiltrated with quarter percent  Marcaine. A small incision was made with a 15 blade knife. The incision was carried down through the subcutaneous tissue bluntly with a hemostat and [employer] retractors. The linea alba was identified. The linea alba was incised with a 15 blade knife and each side was grasped with Coker clamps. The preperitoneal space was then probed with a hemostat until the peritoneum was opened and access was gained to the abdominal cavity. A 0 Vicryl pursestring stitch was placed in the fascia surrounding the opening. A Hassan cannula was then placed through the opening and anchored in place with the previously placed Vicryl purse string stitch. The abdomen was insufflated with carbon dioxide without difficulty. A laparoscope was inserted through the [facility] cannula and the right upper quadrant was inspected. There were  significant adhesions of omentum to the abdominal wall but we were able to maneuver around this. Next the epigastric region was infiltrated with % Marcaine. A small incision was made with a 15 blade knife. A 5 mm port was placed bluntly through this incision into the abdominal cavity under direct vision. Next 2 sites were chosen laterally on the right side of the abdomen for placement of 5 mm ports. Each of these areas was infiltrated with quarter percent Marcaine. Small stab incisions were made with a 15 blade knife. 5 mm ports were then placed bluntly through these incisions into the abdominal cavity under direct vision without difficulty. A blunt grasper was placed through the lateralmost 5 mm port and used to grasp the dome of the gallbladder and elevated anteriorly and superiorly. The gallbladder was very thick walled and inflamed and I was not able to grasp the wall. I was able to hold it up for visualization. Another blunt grasper was placed through the other 5 mm port and used to retract the body and neck of the gallbladder. A dissector was placed through the epigastric port and using the electrocautery the peritoneal reflection at the gallbladder neck was opened. Blunt dissection was then carried out in this area until the gallbladder neck-cystic duct junction was readily identified and a good window was created. Because of the amount of inflammation I did not want to lose my view of the junction and I was able to see the anatomy well. 3 clips were placed proximally on the cystic duct and one distally and the duct was divided between the two. Posterior to this the cystic  artery was identified and again dissected bluntly in a circumferential manner until a good window  was created. 2 clips were placed proximally and one distally on the artery and the artery was divided between the 2 sets of clips. Next a laparoscopic hook cautery device was used to separate the gallbladder from the liver bed. Prior to  completely detaching the gallbladder from the liver bed the liver bed was inspected and several small bleeding points were coagulated with the electrocautery until the area was completely hemostatic. The gallbladder was then detached the rest of it from the liver bed without difficulty. A laparoscopic bag was inserted through the hassan port. The laparoscope was moved to the epigastric port. The gallbladder was placed within the bag and the bag was sealed.  The bag with the gallbladder was then removed with the [facility] cannula through the infraumbilical port without difficulty. The fascial defect was then closed with the previously placed Vicryl pursestring stitch as well as with another figure-of-eight 0 Vicryl stitch. The liver bed was inspected again and found to be hemostatic. The abdomen was irrigated with copious amounts of saline until the effluent was clear. The ports were then removed under direct vision without difficulty and were found to be hemostatic. The gas was allowed to escape. The skin incisions were all closed with interrupted 4-0 Monocryl subcuticular stitches. Dermabond dressings were applied. The patient tolerated the procedure well. At the end of the case all needle sponge and instrument counts were correct. The patient was then awakened and taken to recovery in stable condition   PLAN OF CARE: Admit to inpatient   PATIENT DISPOSITION:  PACU - hemodynamically stable.   Delay start of Pharmacological VTE agent (>24hrs) due to surgical blood loss or risk of bleeding: no

## 2020-11-16 NOTE — Telephone Encounter (Signed)
She is admitted now.

## 2020-11-16 NOTE — Anesthesia Procedure Notes (Signed)
Procedure Name: Intubation Date/Time: 11/16/2020 10:23 AM Performed by: Trinna Post., CRNA Pre-anesthesia Checklist: Patient identified, Emergency Drugs available, Suction available, Patient being monitored and Timeout performed Patient Re-evaluated:Patient Re-evaluated prior to induction Oxygen Delivery Method: Circle system utilized Preoxygenation: Pre-oxygenation with 100% oxygen Induction Type: IV induction Ventilation: Mask ventilation without difficulty Laryngoscope Size: Mac and 3 Grade View: Grade I Tube type: Oral Tube size: 7.0 mm Number of attempts: 1 Airway Equipment and Method: Stylet Placement Confirmation: ETT inserted through vocal cords under direct vision, positive ETCO2 and breath sounds checked- equal and bilateral Secured at: 22 cm Tube secured with: Tape Dental Injury: Teeth and Oropharynx as per pre-operative assessment

## 2020-11-17 ENCOUNTER — Encounter (HOSPITAL_COMMUNITY): Payer: Self-pay | Admitting: General Surgery

## 2020-11-17 LAB — COMPREHENSIVE METABOLIC PANEL
ALT: 201 U/L — ABNORMAL HIGH (ref 0–44)
AST: 80 U/L — ABNORMAL HIGH (ref 15–41)
Albumin: 2.6 g/dL — ABNORMAL LOW (ref 3.5–5.0)
Alkaline Phosphatase: 110 U/L (ref 38–126)
Anion gap: 9 (ref 5–15)
BUN: 7 mg/dL — ABNORMAL LOW (ref 8–23)
CO2: 28 mmol/L (ref 22–32)
Calcium: 8.5 mg/dL — ABNORMAL LOW (ref 8.9–10.3)
Chloride: 106 mmol/L (ref 98–111)
Creatinine, Ser: 0.95 mg/dL (ref 0.44–1.00)
GFR, Estimated: >60 mL/min (ref >60)
Glucose, Bld: 135 mg/dL — ABNORMAL HIGH (ref 70–99)
Potassium: 3.2 mmol/L — ABNORMAL LOW (ref 3.5–5.1)
Sodium: 143 mmol/L (ref 135–145)
Total Bilirubin: 0.6 mg/dL (ref 0.3–1.2)
Total Protein: 5.9 g/dL — ABNORMAL LOW (ref 6.5–8.1)

## 2020-11-17 LAB — SURGICAL PATHOLOGY

## 2020-11-17 MED ORDER — OXYCODONE HCL 5 MG PO TABS: 5.0000 mg | ORAL_TABLET | Freq: Four times a day (QID) | ORAL | 0 refills | Status: DC | PRN

## 2020-11-17 MED ORDER — PANTOPRAZOLE SODIUM 40 MG PO TBEC: 40.0000 mg | DELAYED_RELEASE_TABLET | Freq: Every day | ORAL | Status: DC

## 2020-11-17 MED ORDER — POTASSIUM CHLORIDE CRYS ER 20 MEQ PO TBCR
60.0000 meq | EXTENDED_RELEASE_TABLET | Freq: Once | ORAL | Status: AC
  Administered 2020-11-17: 60 meq via ORAL
  Filled 2020-11-17: qty 3

## 2020-11-17 MED ORDER — ACETAMINOPHEN 325 MG PO TABS: 650.0000 mg | ORAL_TABLET | Freq: Four times a day (QID) | ORAL | Status: AC | PRN

## 2020-11-17 NOTE — Plan of Care (Signed)

## 2020-11-17 NOTE — Plan of Care (Signed)

## 2020-11-17 NOTE — Progress Notes (Signed)
Progress Note  1 Day Post-Op  Subjective: Patient reports abdomen is sore but better than pain from gallbladder. She is tolerating diet without n/v. No flatus or BM yet.   Objective: Vital signs in last 24 hours: Temp:  [97.4 F (36.3 C)-98.6 F (37 C)] 98.6 F (37 C) (10/06 0900) Pulse Rate:  [69-103] 103 (10/06 0900) Resp:  [13-19] 18 (10/06 0900) BP: (110-147)/(47-71) 147/56 (10/06 0900) SpO2:  [91 %-99 %] 95 % (10/06 0900) Last BM Date: 11/14/20  Intake/Output from previous day: 10/05 0701 - 10/06 0700 In: 2628.1 [P.O.:1100; I.V.:1429.6; IV Piggyback:98.6] Out: 100 [Blood:100] Intake/Output this shift: No intake/output data recorded.  PE: General: pleasant, WD, WN female who is laying in bed in NAD HEENT: sclera anicteric  Heart: regular, rate, and rhythm.   Lungs: CTAB, no wheezes, rhonchi, or rales noted.  Respiratory effort nonlabored Abd: soft, appropriately tpp, ND, +BS, incisions c/d/i Psych: A&Ox3 with an appropriate affect.    Lab Results:  Recent Labs    11/14/20 2125 11/16/20 0115  WBC 9.7 12.5*  HGB 12.7 12.8  HCT 38.2 37.9  PLT 403* 394   BMET Recent Labs    11/14/20 2125 11/16/20 0115  NA 139 138  K 3.0* 4.0  CL 102 102  CO2 28 29  GLUCOSE 150* 106*  BUN 9 10  CREATININE 0.80 0.86  CALCIUM 9.2 8.7*   PT/INR No results for input(s): LABPROT, INR in the last 72 hours. CMP     Component Value Date/Time   NA 138 11/16/2020 0115   K 4.0 11/16/2020 0115   CL 102 11/16/2020 0115   CO2 29 11/16/2020 0115   GLUCOSE 106 (H) 11/16/2020 0115   BUN 10 11/16/2020 0115   CREATININE 0.86 11/16/2020 0115   CALCIUM 8.7 (L) 11/16/2020 0115   PROT 6.5 11/16/2020 0115   ALBUMIN 3.1 (L) 11/16/2020 0115   AST 208 (H) 11/16/2020 0115   ALT 335 (H) 11/16/2020 0115   ALKPHOS 136 (H) 11/16/2020 0115   BILITOT 1.5 (H) 11/16/2020 0115   GFRNONAA >60 11/16/2020 0115   GFRAA >60 06/10/2019 1330   Lipase     Component Value Date/Time   LIPASE 25  11/14/2020 2125       Studies/Results: MR ABDOMEN MRCP W WO CONTAST  Result Date: 11/16/2020 CLINICAL DATA:  70 year old female with history of jaundice. EXAM: MRI ABDOMEN WITHOUT AND WITH CONTRAST (INCLUDING MRCP) TECHNIQUE: Multiplanar multisequence MR imaging of the abdomen was performed both before and after the administration of intravenous contrast. Heavily T2-weighted images of the biliary and pancreatic ducts were obtained, and three-dimensional MRCP images were rendered by post processing. CONTRAST:  7.30mL GADAVIST GADOBUTROL 1 MMOL/ML IV SOLN COMPARISON:  No prior abdominal MRI. CT the abdomen and pelvis 11/14/2020. Abdominal ultrasound 11/14/2020. FINDINGS: Lower chest: Unremarkable. Hepatobiliary: Mild diffuse loss of signal intensity throughout the hepatic parenchyma on out of phase dual echo images, indicative of a background of hepatic steatosis. No suspicious cystic or solid hepatic lesions. No intra or extrahepatic biliary ductal dilatation noted on MRCP images. Common bile duct measures 5 mm in the porta hepatis. No filling defects in the common bile duct to suggest choledocholithiasis. Large filling defects are noted within the gallbladder, large signal voids are noted in the gallbladder measuring up to 2.1 x 2.0 cm in the neck of the gallbladder. Gallbladder is moderately distended. However, gallbladder wall appears thickened (up to 7 mm thick in the fundus), inflamed and edematous with moderate volume of pericholecystic  fluid and extensive surrounding inflammatory changes indicative of an acute cholecystitis. Pancreas: In the inferior aspect of the head of the pancreas (axial image 27 of series 3) there is a 3 mm T1 hypointense, T2 hyperintense, nonenhancing lesion. No solid pancreatic mass. No pancreatic ductal dilatation noted on MRCP images. No pancreatic or peripancreatic fluid collections or inflammatory changes. Spleen:  Unremarkable. Adrenals/Urinary Tract: In the posterior  aspect of the interpolar region of the left kidney (axial image 19 of series 3) there is a 1.4 cm T1 hypointense, T2 hyperintense, nonenhancing lesion, compatible with a simple cyst. Right kidney and bilateral adrenal glands are normal in appearance. No hydroureteronephrosis in the visualized portions of the abdomen. Stomach/Bowel: Visualized portions are unremarkable. Vascular/Lymphatic: No aneurysm identified in the visualized abdominal vasculature. No lymphadenopathy noted in the abdomen. Other: Moderate volume of pericholecystic fluid. No other larger volume of ascites. Musculoskeletal: No aggressive appearing osseous lesions are noted in the visualized portions of the skeleton. Signal void is evident in the lower lumbar spine from prior PLIF. IMPRESSION: 1. Cholelithiasis with evidence of acute cholecystitis, as detailed above. 2. No choledocholithiasis or findings to suggest biliary tract obstruction. 3. 3 mm cystic lesion in the inferior aspect of the pancreatic head, nonspecific and statistically likely to represent a benign lesions such as focal side branch ectasia, tiny pancreatic pseudocyst, or small side branch IPMN (intraductal papillary mucinous neoplasm). Repeat abdominal MRI with and without IV gadolinium with MRCP is recommended in 1 year to ensure the stability of this finding. This recommendation follows ACR consensus guidelines: Management of Incidental Pancreatic Cysts: A White Paper of the ACR Incidental Findings Committee. [place] 5364;68:032-122. Preliminary critical value/emergent results were called by telephone at the time of interpretation on 11/15/2020 at 11:55 p.m. to nurse [facility], who verbally acknowledged these results and agreed to notify the on call physician. Electronically Signed   By: Vinnie Langton M.D.   On: 11/16/2020 05:58    Anti-infectives: Anti-infectives (From admission, onward)    Start     Dose/Rate Route Frequency Ordered Stop   11/15/20 2000   cefTRIAXone (ROCEPHIN) 2 g in sodium chloride 0.9 % 100 mL IVPB        2 g 200 mL/hr over 30 Minutes Intravenous Every 24 hours 11/15/20 1412 11/22/20 1959   11/15/20 1115  piperacillin-tazobactam (ZOSYN) IVPB 3.375 g        3.375 g 100 mL/hr over 30 Minutes Intravenous  Once 11/15/20 1111 11/15/20 1233        Assessment/Plan POD1 s/p laparoscopic cholecystectomy  - doing well overall - tolerating PO - mobilize - recheck LFTs - possible discharge this afternoon   FEN: reg diet  VTE: LMWH ID: [email or web address]   LOS: 0 days    Norm Parcel, [facility] 11/17/2020, 10:24 AM Please see Amion for pager number during day hours 7:00am-4:30pm

## 2020-11-17 NOTE — Progress Notes (Signed)
Discharge instructions explained and given to patient. Two IVs removed. Patient dressed herself. Patient wheeled down by nurse tech to meet her ride home.

## 2020-11-17 NOTE — Discharge Summary (Signed)
[facility] Discharge Summary   Patient ID: Colleen Coffey [facility] MRN: [id number] DOB/AGE: April 09, 1950 70 y.o.  Admit date: 11/14/2020 Discharge date: 11/17/2020  Admitting Diagnosis: Acute cholecystitis  Discharge Diagnosis S/P laparoscopic cholecystectomy   Consultants None   Imaging: MR ABDOMEN MRCP W WO CONTAST  Result Date: 11/16/2020 CLINICAL DATA:  70 year old female with history of jaundice. EXAM: MRI ABDOMEN WITHOUT AND WITH CONTRAST (INCLUDING MRCP) TECHNIQUE: Multiplanar multisequence MR imaging of the abdomen was performed both before and after the administration of intravenous contrast. Heavily T2-weighted images of the biliary and pancreatic ducts were obtained, and three-dimensional MRCP images were rendered by post processing. CONTRAST:  7.74mL GADAVIST GADOBUTROL 1 MMOL/ML IV SOLN COMPARISON:  No prior abdominal MRI. CT the abdomen and pelvis 11/14/2020. Abdominal ultrasound 11/14/2020. FINDINGS: Lower chest: Unremarkable. Hepatobiliary: Mild diffuse loss of signal intensity throughout the hepatic parenchyma on out of phase dual echo images, indicative of a background of hepatic steatosis. No suspicious cystic or solid hepatic lesions. No intra or extrahepatic biliary ductal dilatation noted on MRCP images. Common bile duct measures 5 mm in the porta hepatis. No filling defects in the common bile duct to suggest choledocholithiasis. Large filling defects are noted within the gallbladder, large signal voids are noted in the gallbladder measuring up to 2.1 x 2.0 cm in the neck of the gallbladder. Gallbladder is moderately distended. However, gallbladder wall appears thickened (up to 7 mm thick in the fundus), inflamed and edematous with moderate volume of pericholecystic fluid and extensive surrounding inflammatory changes indicative of an acute cholecystitis. Pancreas: In the inferior aspect of the head of the pancreas (axial image 27 of series 3) there is a 3 mm T1  hypointense, T2 hyperintense, nonenhancing lesion. No solid pancreatic mass. No pancreatic ductal dilatation noted on MRCP images. No pancreatic or peripancreatic fluid collections or inflammatory changes. Spleen:  Unremarkable. Adrenals/Urinary Tract: In the posterior aspect of the interpolar region of the left kidney (axial image 19 of series 3) there is a 1.4 cm T1 hypointense, T2 hyperintense, nonenhancing lesion, compatible with a simple cyst. Right kidney and bilateral adrenal glands are normal in appearance. No hydroureteronephrosis in the visualized portions of the abdomen. Stomach/Bowel: Visualized portions are unremarkable. Vascular/Lymphatic: No aneurysm identified in the visualized abdominal vasculature. No lymphadenopathy noted in the abdomen. Other: Moderate volume of pericholecystic fluid. No other larger volume of ascites. Musculoskeletal: No aggressive appearing osseous lesions are noted in the visualized portions of the skeleton. Signal void is evident in the lower lumbar spine from prior PLIF. IMPRESSION: 1. Cholelithiasis with evidence of acute cholecystitis, as detailed above. 2. No choledocholithiasis or findings to suggest biliary tract obstruction. 3. 3 mm cystic lesion in the inferior aspect of the pancreatic head, nonspecific and statistically likely to represent a benign lesions such as focal side branch ectasia, tiny pancreatic pseudocyst, or small side branch IPMN (intraductal papillary mucinous neoplasm). Repeat abdominal MRI with and without IV gadolinium with MRCP is recommended in 1 year to ensure the stability of this finding. This recommendation follows ACR consensus guidelines: Management of Incidental Pancreatic Cysts: A White Paper of the ACR Incidental Findings Committee. [place] 2353;61:443-154. Preliminary critical value/emergent results were called by telephone at the time of interpretation on 11/15/2020 at 11:55 p.m. to nurse [facility], who verbally acknowledged these  results and agreed to notify the on call physician. Electronically Signed   By: Vinnie Langton M.D.   On: 11/16/2020 05:58    Procedures Dr. [name] (11/16/20) -  Laparoscopic Cholecystectomy  Hospital Course:  Patient is a 70 year old female who presented to [facility] with abdominal pain.  Workup showed acute cholecystitis.  Patient was admitted and underwent procedure listed above.  Tolerated procedure well and was transferred to the floor.  Diet was advanced as tolerated.  On POD1, the patient was voiding well, tolerating diet, ambulating well, pain well controlled, vital signs stable, incisions c/d/i and felt stable for discharge home.  Patient will follow up in our office in 3 weeks and knows to call with questions or concerns. She will call to confirm appointment date/time.    Physical Exam: General:  Alert, NAD, pleasant, comfortable Abd:  Soft, ND, mild tenderness, incisions C/D/I  I or a member of my team have reviewed this patient in the Controlled Substance Database.   Allergies as of 11/17/2020   No Known Allergies      Medication List     TAKE these medications    acetaminophen 325 MG tablet Commonly known as: TYLENOL Take 2 tablets (650 mg total) by mouth every 6 (six) hours as needed for mild pain (or temp > 100).   amLODipine 2.5 MG tablet Commonly known as: NORVASC TAKE 1 TABLET BY MOUTH EVERY DAY *NEED APPT* What changed: See the new instructions.   oxyCODONE 5 MG immediate release tablet Commonly known as: Oxy IR/ROXICODONE Take 1 tablet (5 mg total) by mouth every 6 (six) hours as needed for moderate pain or severe pain.   tizanidine 2 MG capsule Commonly known as: ZANAFLEX TAKE 1-2 CAPSULES (2-4 MG TOTAL) BY MOUTH AT BEDTIME AS NEEDED FOR MUSCLE SPASMS. What changed: when to take this   Trulance 3 MG Tabs Generic drug: Plecanatide Take 1 tablet by mouth daily. Please schedule a yearly follow up appointment. Thank you What changed: additional  instructions   VITAMIN D3 PO Take 1,000 Units by mouth daily.          Follow-up Information     [facility], [place]. Call.   Specialty: General Surgery Why: Our office is working on scheduling a follow up appointment in about 3 weeks. Please call to confirm appointment date/time. Contact information: [address] [place] [address] [phone]                 Signed: Norm Parcel , [facility] 11/17/2020, 12:10 PM Please see Amion for pager number during day hours 7:00am-4:30pm

## 2020-11-17 NOTE — Discharge Instructions (Signed)
[facility] LAPAROSCOPIC SURGERY: POST OP INSTRUCTIONS Always review your discharge instruction sheet given to you by the facility where your surgery was performed. IF YOU HAVE DISABILITY OR FAMILY LEAVE FORMS, YOU MUST BRING THEM TO THE OFFICE FOR PROCESSING.   DO NOT GIVE THEM TO YOUR DOCTOR.  PAIN CONTROL  First take acetaminophen (Tylenol) AND/or ibuprofen (Advil) to control your pain after surgery.  Follow directions on package.  Taking acetaminophen (Tylenol) and/or ibuprofen (Advil) regularly after surgery will help to control your pain and lower the amount of prescription pain medication you may need.  You should not take more than 3,000 mg (3 grams) of acetaminophen (Tylenol) in 24 hours.  You should not take ibuprofen (Advil), aleve, motrin, naprosyn or other NSAIDS if you have a history of stomach ulcers or chronic kidney disease.  A prescription for pain medication may be given to you upon discharge.  Take your pain medication as prescribed, if you still have uncontrolled pain after taking acetaminophen (Tylenol) or ibuprofen (Advil). Use ice packs to help control pain. If you need a refill on your pain medication, please contact your pharmacy.  They will contact our office to request authorization. Prescriptions will not be filled after 5pm or on week-ends.  HOME MEDICATIONS Take your usually prescribed medications unless otherwise directed.  DIET You should follow a light diet the first few days after arrival home.  Be sure to include lots of fluids daily. Avoid fatty, fried foods.   CONSTIPATION It is common to experience some constipation after surgery and if you are taking pain medication.  Increasing fluid intake and taking a stool softener (such as Colace) will usually help or prevent this problem from occurring.  A mild laxative (Milk of Magnesia or Miralax) should be taken according to package instructions if there are no bowel movements after 48  hours.  WOUND/INCISION CARE Most patients will experience some swelling and bruising in the area of the incisions.  Ice packs will help.  Swelling and bruising can take several days to resolve.  Unless discharge instructions indicate otherwise, follow guidelines below  STERI-STRIPS - you may remove your outer bandages 48 hours after surgery, and you may shower at that time.  You have steri-strips (small skin tapes) in place directly over the incision.  These strips should be left on the skin for 7-10 days.   DERMABOND/SKIN GLUE - you may shower in 24 hours.  The glue will flake off over the next 2-3 weeks. Any sutures or staples will be removed at the office during your follow-up visit.  ACTIVITIES You may resume regular (light) daily activities beginning the next day--such as daily self-care, walking, climbing stairs--gradually increasing activities as tolerated.  You may have sexual intercourse when it is comfortable.  Refrain from any heavy lifting or straining until approved by your doctor. You may drive when you are no longer taking prescription pain medication, you can comfortably wear a seatbelt, and you can safely maneuver your car and apply brakes.  FOLLOW-UP You should see your doctor in the office for a follow-up appointment approximately 2-3 weeks after your surgery.  You should have been given your post-op/follow-up appointment when your surgery was scheduled.  If you did not receive a post-op/follow-up appointment, make sure that you call for this appointment within a day or two after you arrive home to insure a convenient appointment time.   WHEN TO CALL YOUR DOCTOR: Fever over 101.0 Inability to urinate Continued bleeding from incision.   Increased pain, redness, or drainage from the incision. Increasing abdominal pain  The clinic staff is available to answer your questions during regular business hours.  Please don't hesitate to call and ask to speak to one of the nurses for  clinical concerns.  If you have a medical emergency, go to the nearest emergency room or call 911.  A surgeon from [facility] is always on call at the hospital. [address], [address], [place], [place]  [address] ? [address] [address], [place], [place]   [address] [phone] ? [phone] ? FAX [phone] Web site: [email or web address]      Managing Your Pain After Surgery Without Opioids    Thank you for participating in our program to help patients manage their pain after surgery without opioids. This is part of our effort to provide you with the best care possible, without exposing you or your family to the risk that opioids pose.  What pain can I expect after surgery? You can expect to have some pain after surgery. This is normal. The pain is typically worse the day after surgery, and quickly begins to get better. Many studies have found that many patients are able to manage their pain after surgery with Over-the-Counter (OTC) medications such as Tylenol and Motrin. If you have a condition that does not allow you to take Tylenol or Motrin, notify your surgical team.  How will I manage my pain? The best strategy for controlling your pain after surgery is around the clock pain control with Tylenol (acetaminophen) and Motrin (ibuprofen or Advil). Alternating these medications with each other allows you to maximize your pain control. In addition to Tylenol and Motrin, you can use heating pads or ice packs on your incisions to help reduce your pain.  How will I alternate your regular strength over-the-counter pain medication? You will take a dose of pain medication every three hours. Start by taking 650 mg of Tylenol (2 pills of 325 mg) 3 hours later take 600 mg of Motrin (3 pills of 200 mg) 3 hours after taking the Motrin take 650 mg of Tylenol 3 hours after that take 600 mg of Motrin.   - 1 -  See example - if your first dose of Tylenol is at 12:00  PM   12:00 PM Tylenol 650 mg (2 pills of 325 mg)  3:00 PM Motrin 600 mg (3 pills of 200 mg)  6:00 PM Tylenol 650 mg (2 pills of 325 mg)  9:00 PM Motrin 600 mg (3 pills of 200 mg)  Continue alternating every 3 hours   We recommend that you follow this schedule around-the-clock for at least 3 days after surgery, or until you feel that it is no longer needed. Use the table on the last page of this handout to keep track of the medications you are taking. Important: Do not take more than 3000mg of Tylenol or 3200mg of Motrin in a 24-hour period. Do not take ibuprofen/Motrin if you have a history of bleeding stomach ulcers, severe kidney disease, &/or actively taking a blood thinner  What if I still have pain? If you have pain that is not controlled with the over-the-counter pain medications (Tylenol and Motrin or Advil) you might have what we call "breakthrough" pain. You will receive a prescription for a small amount of an opioid pain medication such as Oxycodone, Tramadol, or Tylenol with Codeine. Use these opioid pills in the first 24 hours after surgery if you have breakthrough pain. Do   not take more than 1 pill every 4-6 hours.  If you still have uncontrolled pain after using all opioid pills, don't hesitate to call our staff using the number provided. We will help make sure you are managing your pain in the best way possible, and if necessary, we can provide a prescription for additional pain medication.   Day 1    Time  Name of Medication Number of pills taken  Amount of Acetaminophen  Pain Level   Comments  AM PM       AM PM       AM PM       AM PM       AM PM       AM PM       AM PM       AM PM       Total Daily amount of Acetaminophen Do not take more than  3,000 mg per day      Day 2    Time  Name of Medication Number of pills taken  Amount of Acetaminophen  Pain Level   Comments  AM PM       AM PM       AM PM       AM PM       AM PM       AM PM       AM  PM       AM PM       Total Daily amount of Acetaminophen Do not take more than  3,000 mg per day      Day 3    Time  Name of Medication Number of pills taken  Amount of Acetaminophen  Pain Level   Comments  AM PM       AM PM       AM PM       AM PM          AM PM       AM PM       AM PM       AM PM       Total Daily amount of Acetaminophen Do not take more than  3,000 mg per day      Day 4    Time  Name of Medication Number of pills taken  Amount of Acetaminophen  Pain Level   Comments  AM PM       AM PM       AM PM       AM PM       AM PM       AM PM       AM PM       AM PM       Total Daily amount of Acetaminophen Do not take more than  3,000 mg per day      Day 5    Time  Name of Medication Number of pills taken  Amount of Acetaminophen  Pain Level   Comments  AM PM       AM PM       AM PM       AM PM       AM PM       AM PM       AM PM       AM PM       Total Daily amount of Acetaminophen Do not take more than    3,000 mg per day       Day 6    Time  Name of Medication Number of pills taken  Amount of Acetaminophen  Pain Level  Comments  AM PM       AM PM       AM PM       AM PM       AM PM       AM PM       AM PM       AM PM       Total Daily amount of Acetaminophen Do not take more than  3,000 mg per day      Day 7    Time  Name of Medication Number of pills taken  Amount of Acetaminophen  Pain Level   Comments  AM PM       AM PM       AM PM       AM PM       AM PM       AM PM       AM PM       AM PM       Total Daily amount of Acetaminophen Do not take more than  3,000 mg per day        For additional information about how and where to safely dispose of unused opioid medications - https://www.morepowerfulnc.org  Disclaimer: This document contains information and/or instructional materials adapted from [facility] for the typical patient with your condition. It does not replace medical advice  from your health care provider because your experience may differ from that of the typical patient. Talk to your health care provider if you have any questions about this document, your condition or your treatment plan. Adapted from [facility]  

## 2020-11-22 ENCOUNTER — Other Ambulatory Visit: Payer: Self-pay | Admitting: Family Medicine

## 2020-11-22 ENCOUNTER — Other Ambulatory Visit: Payer: Self-pay

## 2020-11-22 ENCOUNTER — Ambulatory Visit (INDEPENDENT_AMBULATORY_CARE_PROVIDER_SITE_OTHER): Payer: PPO

## 2020-11-22 DIAGNOSIS — Z0000 Encounter for general adult medical examination without abnormal findings: Secondary | ICD-10-CM

## 2020-11-22 DIAGNOSIS — Z1231 Encounter for screening mammogram for malignant neoplasm of breast: Secondary | ICD-10-CM

## 2020-11-22 NOTE — Patient Instructions (Addendum)
Colleen Coffey , Thank you for taking time to come for your Medicare Wellness Visit. I appreciate your ongoing commitment to your health goals. Please review the following plan we discussed and let me know if I can assist you in the future.   Screening recommendations/referrals: Colonoscopy: Done 08/10/19 repeat every 10 years  Mammogram: Done 02/10/20 repeat every year  Bone Density: Done 02/03/18 repeat every 2 years  Recommended yearly ophthalmology/optometry visit for glaucoma screening and checkup Recommended yearly dental visit for hygiene and checkup  Vaccinations: Influenza vaccine: done 10/13/20 Pneumococcal vaccine: Up to date Tdap vaccine: Done 09/25/19 repeat every 10 years  Shingles vaccine: Done 5/11 &09/02/19    Covid-19:Completed 2/6, 2/27, & 11/13/19, 10/20/20  Advanced directives: Please bring a copy of your health care power of attorney and living will to the office at your convenience.  Conditions/risks identified: Stay healthy  Next appointment: Follow up in one year for your annual wellness visit    Preventive Care 65 Years and Older, Female Preventive care refers to lifestyle choices and visits with your health care provider that can promote health and wellness. What does preventive care include? A yearly physical exam. This is also called an annual well check. Dental exams once or twice a year. Routine eye exams. Ask your health care provider how often you should have your eyes checked. Personal lifestyle choices, including: Daily care of your teeth and gums. Regular physical activity. Eating a healthy diet. Avoiding tobacco and drug use. Limiting alcohol use. Practicing safe sex. Taking low-dose aspirin every day. Taking vitamin and mineral supplements as recommended by your health care provider. What happens during an annual well check? The services and screenings done by your health care provider during your annual well check will depend on your age, overall  health, lifestyle risk factors, and family history of disease. Counseling  Your health care provider may ask you questions about your: Alcohol use. Tobacco use. Drug use. Emotional well-being. Home and relationship well-being. Sexual activity. Eating habits. History of falls. Memory and ability to understand (cognition). Work and work Statistician. Reproductive health. Screening  You may have the following tests or measurements: Height, weight, and BMI. Blood pressure. Lipid and cholesterol levels. These may be checked every 5 years, or more frequently if you are over 27 years old. Skin check. Lung cancer screening. You may have this screening every year starting at age 43 if you have a 30-pack-year history of smoking and currently smoke or have quit within the past 15 years. Fecal occult blood test (FOBT) of the stool. You may have this test every year starting at age 52. Flexible sigmoidoscopy or colonoscopy. You may have a sigmoidoscopy every 5 years or a colonoscopy every 10 years starting at age 42. Hepatitis C blood test. Hepatitis B blood test. Sexually transmitted disease (STD) testing. Diabetes screening. This is done by checking your blood sugar (glucose) after you have not eaten for a while (fasting). You may have this done every 1-3 years. Bone density scan. This is done to screen for osteoporosis. You may have this done starting at age 10. Mammogram. This may be done every 1-2 years. Talk to your health care provider about how often you should have regular mammograms. Talk with your health care provider about your test results, treatment options, and if necessary, the need for more tests. Vaccines  Your health care provider may recommend certain vaccines, such as: Influenza vaccine. This is recommended every year. Tetanus, diphtheria, and acellular pertussis (Tdap, Td)  vaccine. You may need a Td booster every 10 years. Zoster vaccine. You may need this after age  50. Pneumococcal 13-valent conjugate (PCV13) vaccine. One dose is recommended after age 73. Pneumococcal polysaccharide (PPSV23) vaccine. One dose is recommended after age 58. Talk to your health care provider about which screenings and vaccines you need and how often you need them. This information is not intended to replace advice given to you by your health care provider. Make sure you discuss any questions you have with your health care provider. Document Released: 02/25/2015 Document Revised: 10/19/2015 Document Reviewed: 11/30/2014 Elsevier Interactive Patient Education  2017 [place] Prevention in the Home Falls can cause injuries. They can happen to people of all ages. There are many things you can do to make your home safe and to help prevent falls. What can I do on the outside of my home? Regularly fix the edges of walkways and driveways and fix any cracks. Remove anything that might make you trip as you walk through a door, such as a raised step or threshold. Trim any bushes or trees on the path to your home. Use bright outdoor lighting. Clear any walking paths of anything that might make someone trip, such as rocks or tools. Regularly check to see if handrails are loose or broken. Make sure that both sides of any steps have handrails. Any raised decks and porches should have guardrails on the edges. Have any leaves, snow, or ice cleared regularly. Use sand or salt on walking paths during winter. Clean up any spills in your garage right away. This includes oil or grease spills. What can I do in the bathroom? Use night lights. Install grab bars by the toilet and in the tub and shower. Do not use towel bars as grab bars. Use non-skid mats or decals in the tub or shower. If you need to sit down in the shower, use a plastic, non-slip stool. Keep the floor dry. Clean up any water that spills on the floor as soon as it happens. Remove soap buildup in the tub or shower  regularly. Attach bath mats securely with double-sided non-slip rug tape. Do not have throw rugs and other things on the floor that can make you trip. What can I do in the bedroom? Use night lights. Make sure that you have a light by your bed that is easy to reach. Do not use any sheets or blankets that are too big for your bed. They should not hang down onto the floor. Have a firm chair that has side arms. You can use this for support while you get dressed. Do not have throw rugs and other things on the floor that can make you trip. What can I do in the kitchen? Clean up any spills right away. Avoid walking on wet floors. Keep items that you use a lot in easy-to-reach places. If you need to reach something above you, use a strong step stool that has a grab bar. Keep electrical cords out of the way. Do not use floor polish or wax that makes floors slippery. If you must use wax, use non-skid floor wax. Do not have throw rugs and other things on the floor that can make you trip. What can I do with my stairs? Do not leave any items on the stairs. Make sure that there are handrails on both sides of the stairs and use them. Fix handrails that are broken or loose. Make sure that handrails are as long  as the stairways. Check any carpeting to make sure that it is firmly attached to the stairs. Fix any carpet that is loose or worn. Avoid having throw rugs at the top or bottom of the stairs. If you do have throw rugs, attach them to the floor with carpet tape. Make sure that you have a light switch at the top of the stairs and the bottom of the stairs. If you do not have them, ask someone to add them for you. What else can I do to help prevent falls? Wear shoes that: Do not have high heels. Have rubber bottoms. Are comfortable and fit you well. Are closed at the toe. Do not wear sandals. If you use a stepladder: Make sure that it is fully opened. Do not climb a closed stepladder. Make sure that  both sides of the stepladder are locked into place. Ask someone to hold it for you, if possible. Clearly mark and make sure that you can see: Any grab bars or handrails. First and last steps. Where the edge of each step is. Use tools that help you move around (mobility aids) if they are needed. These include: Canes. Walkers. Scooters. Crutches. Turn on the lights when you go into a dark area. Replace any light bulbs as soon as they burn out. Set up your furniture so you have a clear path. Avoid moving your furniture around. If any of your floors are uneven, fix them. If there are any pets around you, be aware of where they are. Review your medicines with your doctor. Some medicines can make you feel dizzy. This can increase your chance of falling. Ask your doctor what other things that you can do to help prevent falls. This information is not intended to replace advice given to you by your health care provider. Make sure you discuss any questions you have with your health care provider. Document Released: 11/25/2008 Document Revised: 07/07/2015 Document Reviewed: 03/05/2014 Elsevier Interactive Patient Education  2017 [employer].

## 2020-11-22 NOTE — Progress Notes (Addendum)
Virtual Visit via Telephone Note  I connected with  Colleen Coffey on 11/23/20 at 11:00 AM EDT by telephone and verified that I am speaking with the correct person using two identifiers.  Medicare Annual Wellness visit completed telephonically due to Covid-19 pandemic.   Persons participating in this call: This Health Coach and this patient.   Location: Patient: Home Provider: Office   I discussed the limitations, risks, security and privacy concerns of performing an evaluation and management service by telephone and the availability of in person appointments. The patient expressed understanding and agreed to proceed.  Unable to perform video visit due to video visit attempted and failed and/or patient does not have video capability.   Some vital signs may be absent or patient reported.   Willette Brace, LPN   Subjective:   Colleen Coffey is a 70 y.o. female who presents for Medicare Annual (Subsequent) preventive examination.  Review of Systems     Cardiac Risk Factors include: advanced age (>49men, >23 women);hypertension;obesity (BMI >30kg/m2)     Objective:    Today's Vitals   11/22/20 1102  PainSc: 0-No pain   There is no height or weight on file to calculate BMI.  Advanced Directives 11/22/2020 11/15/2020 11/17/2019 06/04/2017  Does Patient Have a Medical Advance Directive? Yes No Yes Yes  Type of Advance Directive Living will - [place];Living will [place];Living will  Copy of [place] in Chart? - - No - copy requested -  Would patient like information on creating a medical advance directive? - No - Patient declined - -    Current Medications (verified) Outpatient Encounter Medications as of 11/22/2020  Medication Sig   acetaminophen (TYLENOL) 325 MG tablet Take 2 tablets (650 mg total) by mouth every 6 (six) hours as needed for mild pain (or temp > 100).   Cholecalciferol (VITAMIN D3 PO)  Take 1,000 Units by mouth daily.   tizanidine (ZANAFLEX) 2 MG capsule TAKE 1-2 CAPSULES (2-4 MG TOTAL) BY MOUTH AT BEDTIME AS NEEDED FOR MUSCLE SPASMS. (Patient taking differently: Take 2-4 mg by mouth daily as needed for muscle spasms.)   [DISCONTINUED] amLODipine (NORVASC) 2.5 MG tablet TAKE 1 TABLET BY MOUTH EVERY DAY *NEED APPT* (Patient taking differently: Take 2.5 mg by mouth daily.)   FLUZONE HIGH-DOSE QUADRIVALENT 0.7 ML SUSY    oxyCODONE (OXY IR/ROXICODONE) 5 MG immediate release tablet Take 1 tablet (5 mg total) by mouth every 6 (six) hours as needed for moderate pain or severe pain. (Patient not taking: Reported on 11/22/2020)   PFIZER COVID-19 VAC BIVALENT injection    [DISCONTINUED] Plecanatide (TRULANCE) 3 MG TABS Take 1 tablet by mouth daily. Please schedule a yearly follow up appointment. Thank you (Patient not taking: Reported on 11/22/2020)   No facility-administered encounter medications on file as of 11/22/2020.    Allergies (verified) Patient has no known allergies.   History: Past Medical History:  Diagnosis Date   Constipation    chronic, improved with healthy diet   Endocervical polyp    GERD (gastroesophageal reflux disease)    Hemorrhoids    resolved   Hypertension    Osteoarthritis, knee 06/27/2015   -saw [facility]    Osteopenia 06/27/2015   -reports on medication remotely with gyn    Past Surgical History:  Procedure Laterality Date   APPENDECTOMY     BACK SURGERY  2014   bulging disc lumbar spine   BREAST CYST ASPIRATION Right  CESAREAN SECTION     2   CHOLECYSTECTOMY N/A 11/16/2020   Procedure: LAPAROSCOPIC CHOLECYSTECTOMY;  Surgeon: Jovita Kussmaul, MD;  Location: [facility] OR;  Service: General;  Laterality: N/A;   Family History  Problem Relation Age of Onset   Hyperlipidemia Other    Hypertension Other    Depression Other    High blood pressure Mother    High Cholesterol Mother    Dementia Mother    Arthritis Mother    Heart disease  Mother    Syncope episode Mother    High blood pressure Father    COPD Father    High blood pressure Sister    COPD Brother    High blood pressure Brother    High blood pressure Maternal Grandmother    High Cholesterol Maternal Grandmother    Dementia Maternal Grandmother    Arthritis Maternal Grandmother    Parkinson's disease Sister    Arthritis Sister    Esophageal cancer Neg Hx    Colon cancer Neg Hx    Pancreatic cancer Neg Hx    Stomach cancer Neg Hx    Liver disease Neg Hx    Social History   Socioeconomic History   Marital status: Widowed    Spouse name: Not on file   Number of children: Not on file   Years of education: Not on file   Highest education level: Not on file  Occupational History   Occupation: Retired  Tobacco Use   Smoking status: Never   Smokeless tobacco: Never  Vaping Use   Vaping Use: Never used  Substance and Sexual Activity   Alcohol use: No   Drug use: No   Sexual activity: Never    Birth control/protection: Post-menopausal  Other Topics Concern   Not on file  Social History Narrative   Work or School: retired, used to be a Librarian, academic for [employer]      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; as a Engineer, materials; husband and mother passed in 2013      Spiritual Beliefs: Christian      Lifestyle: exercising; eating healthy      Social Determinants of Health   Financial Resource Strain: Low Risk    Difficulty of Paying Living Expenses: Not hard at all  Food Insecurity: No Food Insecurity   Worried About Charity fundraiser in the Last Year: Never true   [place] in the Last Year: Never true  Transportation Needs: No Transportation Needs   Lack of Transportation (Medical): No   Lack of Transportation (Non-Medical): No  Physical Activity: Inactive   Days of Exercise per Week: 0 days   Minutes of Exercise per Session: 0 min  Stress: No Stress Concern Present   Feeling of Stress : Not at all  Social Connections: Moderately Isolated    Frequency of Communication with Friends and Family: More than three times a week   Frequency of Social Gatherings with Friends and Family: Once a week   Attends Religious Services: More than 4 times per year   Active Member of [employer] or Organizations: No   Attends Archivist Meetings: Never   Marital Status: Widowed    Tobacco Counseling Counseling given: Not Answered   Clinical Intake:  Pre-visit preparation completed: Yes  Pain : No/denies pain Pain Score: 0-No pain     BMI - recorded: 32.94 Nutritional Status: BMI > 30  Obese Nutritional Risks: None Diabetes: No  How often do you need to  have someone help you when you read instructions, pamphlets, or other written materials from your doctor or pharmacy?: 1 - Never  Diabetic?No  Interpreter Needed?: No  Information entered by :: Charlott Rakes, LPN   Activities of Daily Living In your present state of health, do you have any difficulty performing the following activities: 11/22/2020 11/15/2020  Hearing? N N  Vision? N N  Difficulty concentrating or making decisions? N N  Walking or climbing stairs? N N  Dressing or bathing? N N  Doing errands, shopping? N N  Preparing Food and eating ? N -  Using the Toilet? N -  In the past six months, have you accidently leaked urine? N -  Do you have problems with loss of bowel control? N -  Managing your Medications? N -  Managing your Finances? N -  Housekeeping or managing your Housekeeping? N -  Some recent data might be hidden    Patient Care Team: Caren Macadam, MD as PCP - General (Family Medicine)  Indicate any recent Medical Services you may have received from other than [facility] providers in the past year (date may be approximate).     Assessment:   This is a routine wellness examination for [place].  Hearing/Vision screen Hearing Screening - Comments:: Pt denies any hearing issues  Vision Screening - Comments:: [facility] with [facility] for  annual eye exams   Dietary issues and exercise activities discussed: Current Exercise Habits: The patient does not participate in regular exercise at present   Goals Addressed             This Visit's Progress    Patient Stated       Stay healthy        Depression Screen PHQ 2/9 Scores 11/22/2020 04/27/2020 11/17/2019 04/09/2019 04/08/2019 11/13/2018 01/02/2018  PHQ - 2 Score 0 0 0 0 0 1 0  PHQ- 9 Score - - - - - - 1    Fall Risk Fall Risk  11/22/2020 04/27/2020 11/17/2019 04/08/2019 11/13/2018  Falls in the past year? 1 0 1 0 1  Number falls in past yr: 1 0 1 0 0  Injury with Fall? 1 - 0 0 0  Comment hurt righ tleg - - - -  Risk for fall due to : Impaired vision - Impaired vision;History of fall(s) - -  Follow up Falls prevention discussed - Falls prevention discussed - -    FALL RISK PREVENTION PERTAINING TO THE HOME:  Any stairs in or around the home? Yes  If so, are there any without handrails? No  Home free of loose throw rugs in walkways, pet beds, electrical cords, etc? Yes  Adequate lighting in your home to reduce risk of falls? Yes   ASSISTIVE DEVICES UTILIZED TO PREVENT FALLS:  Life alert? Apple watch alert  Use of a cane, walker or w/c? No  Grab bars in the bathroom? Yes  Shower chair or bench in shower? Yes  Elevated toilet seat or a handicapped toilet? No   TIMED UP AND GO:  Was the test performed? No .   Cognitive Function:     6CIT Screen 11/22/2020 11/17/2019  What Year? 0 points 0 points  What month? 0 points 0 points  What time? 0 points -  Count back from 20 0 points 0 points  Months in reverse 0 points 0 points  Repeat phrase 0 points 2 points  Total Score 0 -    Immunizations Immunization History  Administered Date(s) Administered   Fluad Quad(high Dose 65+) 10/16/2018   Influenza Split 11/15/2010, 11/08/2011   Influenza Whole 11/26/2006, 11/25/2007, 11/17/2008, 11/02/2009   Influenza, High Dose Seasonal PF 10/21/2014, 10/27/2015,  10/25/2016, 10/10/2017   Influenza-Unspecified 10/13/2013, 10/29/2019, 10/13/2020   PFIZER(Purple Top)SARS-COV-2 Vaccination 03/21/2019, 04/11/2019, 11/13/2019, 10/20/2020   Pneumococcal Conjugate-13 12/16/2014   Pneumococcal Polysaccharide-23 10/04/2016   Td 02/12/1998, 01/04/2009, 09/25/2019   Zoster Recombinat (Shingrix) 06/23/2019, 09/02/2019   Zoster, Live 02/19/2014    TDAP status: Up to date  Flu Vaccine status: Up to date  Pneumococcal vaccine status: Up to date  Covid-19 vaccine status: Completed vaccines  Qualifies for Shingles Vaccine? Yes   Zostavax completed Yes   Shingrix Completed?: Yes  Screening Tests Health Maintenance  Topic Date Due   MAMMOGRAM  02/09/2022   COLONOSCOPY (Pts 45-30yrs Insurance coverage will need to be confirmed)  08/09/2029   TETANUS/TDAP  09/24/2029   INFLUENZA VACCINE  Completed   DEXA SCAN  Completed   COVID-19 Vaccine  Completed   Hepatitis C Screening  Completed   Zoster Vaccines- Shingrix  Completed   HPV VACCINES  Aged Out    Health Maintenance  There are no preventive care reminders to display for this patient.   Colorectal cancer screening: Type of screening: Colonoscopy. Completed 08/10/19. Repeat every 10 years  Mammogram status: Completed 02/10/20. Repeat every year  Bone Density status: Completed 02/03/18. Results reflect: Bone density results: OSTEOPENIA. Repeat every 2 years.  Additional Screening:  Hepatitis C Screening:  Completed 10/04/16  Vision Screening: Recommended annual ophthalmology exams for early detection of glaucoma and other disorders of the eye. Is the patient up to date with their annual eye exam?  Yes  Who is the provider or what is the name of the office in which the patient attends annual eye exams? [facility] If pt is not established with a provider, would they like to be referred to a provider to establish care? No .   Dental Screening: Recommended annual dental exams for proper oral  hygiene  Community Resource Referral / Chronic Care Management: CRR required this visit?  No   CCM required this visit?  No      Plan:     I have personally reviewed and noted the following in the patient's chart:   Medical and social history Use of alcohol, tobacco or illicit drugs  Current medications and supplements including opioid prescriptions.  Functional ability and status Nutritional status Physical activity Advanced directives List of other physicians Hospitalizations, surgeries, and ER visits in previous 12 months Vitals Screenings to include cognitive, depression, and falls Referrals and appointments  In addition, I have reviewed and discussed with patient certain preventive protocols, quality metrics, and best practice recommendations. A written personalized care plan for preventive services as well as general preventive health recommendations were provided to patient.     Willette Brace, LPN   [id number]   Nurse Notes: none

## 2020-11-23 ENCOUNTER — Other Ambulatory Visit: Payer: Self-pay | Admitting: Family Medicine

## 2020-12-26 ENCOUNTER — Encounter: Payer: Self-pay | Admitting: Family Medicine

## 2020-12-26 NOTE — Telephone Encounter (Signed)
Noted  

## 2021-01-15 ENCOUNTER — Other Ambulatory Visit: Payer: Self-pay | Admitting: Family Medicine

## 2021-02-10 ENCOUNTER — Ambulatory Visit
Admission: RE | Admit: 2021-02-10 | Discharge: 2021-02-10 | Disposition: A | Discharge status: Home / Self Care | Payer: PPO | Source: Ambulatory Visit | Attending: Family Medicine | Admitting: Family Medicine

## 2021-02-10 DIAGNOSIS — Z1231 Encounter for screening mammogram for malignant neoplasm of breast: Secondary | ICD-10-CM

## 2021-03-17 ENCOUNTER — Other Ambulatory Visit: Payer: Self-pay | Admitting: Family Medicine

## 2021-04-28 ENCOUNTER — Encounter: Payer: Self-pay | Admitting: Family Medicine

## 2021-04-28 ENCOUNTER — Ambulatory Visit (INDEPENDENT_AMBULATORY_CARE_PROVIDER_SITE_OTHER): Payer: No Typology Code available for payment source | Admitting: Family Medicine

## 2021-04-28 VITALS — BP 122/76 | HR 77 | Temp 99.1°F | Ht 60.0 in | Wt 167.1 lb

## 2021-04-28 DIAGNOSIS — I10 Essential (primary) hypertension: Secondary | ICD-10-CM | POA: Diagnosis not present

## 2021-04-28 DIAGNOSIS — E785 Hyperlipidemia, unspecified: Secondary | ICD-10-CM | POA: Diagnosis not present

## 2021-04-28 DIAGNOSIS — R7301 Impaired fasting glucose: Secondary | ICD-10-CM

## 2021-04-28 DIAGNOSIS — Z23 Encounter for immunization: Secondary | ICD-10-CM

## 2021-04-28 DIAGNOSIS — Z0000 Encounter for general adult medical examination without abnormal findings: Secondary | ICD-10-CM | POA: Diagnosis not present

## 2021-04-28 DIAGNOSIS — E2839 Other primary ovarian failure: Secondary | ICD-10-CM | POA: Diagnosis not present

## 2021-04-28 DIAGNOSIS — M8580 Other specified disorders of bone density and structure, unspecified site: Secondary | ICD-10-CM | POA: Diagnosis not present

## 2021-04-28 LAB — CBC WITH DIFFERENTIAL/PLATELET
Basophils Absolute: 0 10*3/uL (ref 0.0–0.1)
Basophils Relative: 0.6 % (ref 0.0–3.0)
Eosinophils Absolute: 0 10*3/uL (ref 0.0–0.7)
Eosinophils Relative: 0.7 % (ref 0.0–5.0)
HCT: 40.7 % (ref 36.0–46.0)
Hemoglobin: 13.6 g/dL (ref 12.0–15.0)
Lymphocytes Relative: 28.7 % (ref 12.0–46.0)
Lymphs Abs: 1.9 10*3/uL (ref 0.7–4.0)
MCHC: 33.4 g/dL (ref 30.0–36.0)
MCV: 89.1 fl (ref 78.0–100.0)
Monocytes Absolute: 0.5 10*3/uL (ref 0.1–1.0)
Monocytes Relative: 8.3 % (ref 3.0–12.0)
Neutro Abs: 4 10*3/uL (ref 1.4–7.7)
Neutrophils Relative %: 61.7 % (ref 43.0–77.0)
Platelets: 366 10*3/uL (ref 150.0–400.0)
RBC: 4.56 Mil/uL (ref 3.87–5.11)
RDW: 14.4 % (ref 11.5–15.5)
WBC: 6.5 10*3/uL (ref 4.0–10.5)

## 2021-04-28 LAB — COMPREHENSIVE METABOLIC PANEL
ALT: 31 U/L (ref 0–35)
AST: 39 U/L — ABNORMAL HIGH (ref 0–37)
Albumin: 4.3 g/dL (ref 3.5–5.2)
Alkaline Phosphatase: 59 U/L (ref 39–117)
BUN: 21 mg/dL (ref 6–23)
CO2: 32 mEq/L (ref 19–32)
Calcium: 10.2 mg/dL (ref 8.4–10.5)
Chloride: 102 mEq/L (ref 96–112)
Creatinine, Ser: 0.74 mg/dL (ref 0.40–1.20)
GFR: 81.2 mL/min (ref >60.00)
Glucose, Bld: 83 mg/dL (ref 70–99)
Potassium: 4.2 mEq/L (ref 3.5–5.1)
Sodium: 140 mEq/L (ref 135–145)
Total Bilirubin: 0.5 mg/dL (ref 0.2–1.2)
Total Protein: 8 g/dL (ref 6.0–8.3)

## 2021-04-28 LAB — LIPID PANEL
Cholesterol: 208 mg/dL — ABNORMAL HIGH (ref 0–200)
HDL: 80.6 mg/dL (ref >39.00)
LDL Cholesterol: 113 mg/dL — ABNORMAL HIGH (ref 0–99)
NonHDL: 127.34
Total CHOL/HDL Ratio: 3
Triglycerides: 72 mg/dL (ref 0.0–149.0)
VLDL: 14.4 mg/dL (ref 0.0–40.0)

## 2021-04-28 LAB — HEMOGLOBIN A1C: Hgb A1c MFr Bld: 6 % (ref 4.6–6.5)

## 2021-04-28 NOTE — Progress Notes (Signed)
Colleen Coffey DOB: 1950-03-07 Encounter date: 04/28/2021  This is a 71 y.o. female who presents for complete physical   History of present illness/Additional concerns: Taking miralax every day; gives her increased gas at end of day, but keeps her regular. With this she can go every day.  Started in November.   Just rejoined the gym. Had been walking/riding bike at least 3times/week. She has had some knee issues, she is following with specialist for this. Knees feel better with this regular exericse. Working on exercise to strengthen/support knees.   Mammogram 01/2021 normal.   ZOX:WRUEAVWUJW 2.5mg  daily. Not checking regularly at home. States that when she has tried to check she will get high numbers, so she stopped. Arm cuff.  No headaches, no dizziness, no chest pain/pressure.  JX:BJYN controlled.  Pap 04/2020 was normal with negative HPV.  Repeat colonoscopy due 07/2029   Past Medical History:  Diagnosis Date   Constipation    chronic, improved with healthy diet   Endocervical polyp    GERD (gastroesophageal reflux disease)    Hemorrhoids    resolved   Hypertension    Osteoarthritis, knee 06/27/2015   -saw [facility]    Osteopenia 06/27/2015   -reports on medication remotely with gyn    Past Surgical History:  Procedure Laterality Date   APPENDECTOMY     BACK SURGERY  2014   bulging disc lumbar spine   BREAST CYST ASPIRATION Right    CESAREAN SECTION     2   CHOLECYSTECTOMY N/A 11/16/2020   Procedure: LAPAROSCOPIC CHOLECYSTECTOMY;  Surgeon: Griselda Miner, MD;  Location: [facility] OR;  Service: General;  Laterality: N/A;   No Known Allergies Current Meds  Medication Sig   acetaminophen (TYLENOL) 325 MG tablet Take 2 tablets (650 mg total) by mouth every 6 (six) hours as needed for mild pain (or temp > 100).   amLODipine (NORVASC) 2.5 MG tablet TAKE 1 TABLET BY MOUTH EVERY DAY   Cholecalciferol (VITAMIN D3 PO) Take 1,000 Units by mouth daily.   FLUZONE HIGH-DOSE  QUADRIVALENT 0.7 ML SUSY    tizanidine (ZANAFLEX) 2 MG capsule TAKE 1-2 CAPSULES (2-4 MG TOTAL) BY MOUTH AT BEDTIME AS NEEDED FOR MUSCLE SPASMS. (Patient taking differently: Take 2-4 mg by mouth daily as needed for muscle spasms.)   Social History   Tobacco Use   Smoking status: Never   Smokeless tobacco: Never  Substance Use Topics   Alcohol use: No   Family History  Problem Relation Age of Onset   High blood pressure Mother    High Cholesterol Mother    Dementia Mother    Arthritis Mother    Heart disease Mother    Syncope episode Mother    High blood pressure Father    COPD Father    High blood pressure Sister    Parkinson's disease Sister    Arthritis Sister    High blood pressure Maternal Grandmother    High Cholesterol Maternal Grandmother    Dementia Maternal Grandmother    Arthritis Maternal Grandmother    COPD Brother    High blood pressure Brother    Hyperlipidemia Other    Hypertension Other    Depression Other    Esophageal cancer Neg Hx    Colon cancer Neg Hx    Pancreatic cancer Neg Hx    Stomach cancer Neg Hx    Liver disease Neg Hx    Breast cancer Neg Hx      Review of Systems  Constitutional:  Negative for activity change, appetite change, chills, fatigue, fever and unexpected weight change.  HENT:  Negative for congestion, ear pain, hearing loss, sinus pressure, sinus pain, sore throat and trouble swallowing.   Eyes:  Negative for pain and visual disturbance.  Respiratory:  Negative for cough, chest tightness, shortness of breath and wheezing.   Cardiovascular:  Negative for chest pain, palpitations and leg swelling.  Gastrointestinal:  Negative for abdominal pain, blood in stool, constipation, diarrhea, nausea and vomiting.  Genitourinary:  Negative for difficulty urinating and menstrual problem.  Musculoskeletal:  Positive for arthralgias (knees, right ankle (fell in fall - ok to walk on but can ache at night); otherwise intermittent joints).  Negative for back pain.  Skin:  Negative for rash.  Neurological:  Negative for dizziness, weakness, numbness and headaches.  Hematological:  Negative for adenopathy. Does not bruise/bleed easily.  Psychiatric/Behavioral:  Negative for sleep disturbance and suicidal ideas. The patient is not nervous/anxious.    CBC:  Lab Results  Component Value Date   WBC 12.5 (H) 11/16/2020   HGB 12.8 11/16/2020   HCT 37.9 11/16/2020   MCH 29.2 11/16/2020   MCHC 33.8 11/16/2020   RDW 13.5 11/16/2020   PLT 394 11/16/2020   CMP: Lab Results  Component Value Date   NA 143 11/17/2020   K 3.2 (L) 11/17/2020   CL 106 11/17/2020   CO2 28 11/17/2020   ANIONGAP 9 11/17/2020   GLUCOSE 135 (H) 11/17/2020   BUN 7 (L) 11/17/2020   CREATININE 0.95 11/17/2020   GFRAA >60 06/10/2019   CALCIUM 8.5 (L) 11/17/2020   PROT 5.9 (L) 11/17/2020   BILITOT 0.6 11/17/2020   ALKPHOS 110 11/17/2020   ALT 201 (H) 11/17/2020   AST 80 (H) 11/17/2020   LIPID: Lab Results  Component Value Date   CHOL 208 (H) 04/27/2020   TRIG 81.0 04/27/2020   HDL 69.20 04/27/2020   LDLCALC 123 (H) 04/27/2020    Objective:  BP 122/76 (BP Location: Left Arm, Patient Position: Sitting, Cuff Size: Normal)   Pulse 77   Temp 99.1 F (37.3 C) (Oral)   Ht 5' (1.524 m)   Wt 167 lb 1.6 oz (75.8 kg)   LMP 01/11/2017 (Exact Date)   SpO2 100%   BMI 32.63 kg/m   Weight: 167 lb 1.6 oz (75.8 kg)   BP Readings from Last 3 Encounters:  04/28/21 122/76  11/17/20 (!) 149/66  04/27/20 140/80   Wt Readings from Last 3 Encounters:  04/28/21 167 lb 1.6 oz (75.8 kg)  11/16/20 168 lb 10.4 oz (76.5 kg)  04/27/20 164 lb 3.2 oz (74.5 kg)    Physical Exam Constitutional:      General: She is not in acute distress.    Appearance: She is well-developed.  HENT:     Head: Normocephalic and atraumatic.     Right Ear: External ear normal.     Left Ear: External ear normal.     Mouth/Throat:     Pharynx: No oropharyngeal exudate.  Eyes:      Conjunctiva/sclera: Conjunctivae normal.     Pupils: Pupils are equal, round, and reactive to light.  Neck:     Thyroid: No thyromegaly.  Cardiovascular:     Rate and Rhythm: Normal rate and regular rhythm.     Heart sounds: Normal heart sounds. No murmur heard.   No friction rub. No gallop.  Pulmonary:     Effort: Pulmonary effort is normal.     Breath sounds:  Normal breath sounds.  Abdominal:     General: Bowel sounds are normal. There is no distension.     Palpations: Abdomen is soft. There is no mass.     Tenderness: There is no abdominal tenderness. There is no guarding.     Hernia: No hernia is present.  Musculoskeletal:        General: No tenderness or deformity. Normal range of motion.     Cervical back: Normal range of motion and neck supple.  Lymphadenopathy:     Cervical: No cervical adenopathy.  Skin:    General: Skin is warm and dry.     Findings: No rash.  Neurological:     Mental Status: She is alert and oriented to person, place, and time.     Deep Tendon Reflexes: Reflexes normal.     Reflex Scores:      Tricep reflexes are 2+ on the right side and 2+ on the left side.      Bicep reflexes are 2+ on the right side and 2+ on the left side.      Brachioradialis reflexes are 2+ on the right side and 2+ on the left side.      Patellar reflexes are 2+ on the right side and 2+ on the left side. Psychiatric:        Speech: Speech normal.        Behavior: Behavior normal.        Thought Content: Thought content normal.    Assessment/Plan: There are no preventive care reminders to display for this patient.  Health Maintenance reviewed  1. Preventative health care - Pneumococcal conjugate vaccine 20-valent (Prevnar 20)  2. Hypertension, unspecified type Well controlled. Continue with current amlodipine. Encouraged home cuff check against office cuff.  - CBC with Differential/Platelet; Future - Comprehensive metabolic panel; Future  3. Osteopenia,  unspecified location Continue with weight bearing exercise, vitamin D. Recheck DEXA. - DG Bone Density; Future  4. Estrogen deficiency - DG Bone Density; Future  5. Impaired fasting glucose - Hemoglobin A1c; Future  6. Hyperlipidemia, unspecified hyperlipidemia type Diet controlled. - Lipid panel; Future   Return in about 6 months (around 10/29/2021) for Chronic condition visit.  Theodis Shove, MD

## 2021-04-28 NOTE — Patient Instructions (Signed)
Can consider trying additional fiber supplement like benefiber or citrucel (or metamucil) to see it this helps with moving bowels but with less gas. Could also consider beano/simethicone just to help with gas.  ?

## 2021-06-06 ENCOUNTER — Telehealth: Payer: Self-pay | Admitting: Family Medicine

## 2021-06-06 MED ORDER — ROSUVASTATIN CALCIUM 5 MG PO TABS: 5.0000 mg | ORAL_TABLET | Freq: Every day | ORAL | 0 refills | Status: DC

## 2021-06-06 NOTE — Telephone Encounter (Signed)
Pt seen dr Ethlyn Gallery on 04-28-2021 and she is calling and would like to try crestor dr Inocente Salles has suggested  ?[id number] [place] [place] - [address]:  [phone] ?Fax:  [phone] ?  ? ?

## 2021-06-06 NOTE — Telephone Encounter (Signed)
Rx done (per order via results note). ?

## 2021-08-28 ENCOUNTER — Other Ambulatory Visit: Payer: Self-pay | Admitting: *Deleted

## 2021-08-28 MED ORDER — ROSUVASTATIN CALCIUM 5 MG PO TABS: 5.0000 mg | ORAL_TABLET | Freq: Every day | ORAL | 0 refills | Status: DC

## 2021-09-04 ENCOUNTER — Other Ambulatory Visit: Payer: Self-pay | Admitting: *Deleted

## 2021-09-05 MED ORDER — AMLODIPINE BESYLATE 2.5 MG PO TABS: 2.5000 mg | ORAL_TABLET | Freq: Every day | ORAL | 0 refills | Status: DC

## 2021-09-11 ENCOUNTER — Ambulatory Visit (INDEPENDENT_AMBULATORY_CARE_PROVIDER_SITE_OTHER): Payer: No Typology Code available for payment source | Admitting: Family

## 2021-09-11 ENCOUNTER — Encounter: Payer: Self-pay | Admitting: Family

## 2021-09-11 VITALS — BP 141/92 | HR 70 | Temp 97.7°F | Ht <= 58 in | Wt 172.5 lb

## 2021-09-11 DIAGNOSIS — R42 Dizziness and giddiness: Secondary | ICD-10-CM

## 2021-09-11 DIAGNOSIS — I499 Cardiac arrhythmia, unspecified: Secondary | ICD-10-CM | POA: Diagnosis not present

## 2021-09-11 MED ORDER — MECLIZINE HCL 12.5 MG PO TABS: 6.2500 mg | ORAL_TABLET | Freq: Three times a day (TID) | ORAL | 0 refills | Status: DC | PRN

## 2021-09-11 NOTE — Patient Instructions (Signed)
It was very nice to see you today!   Your EKG was normal, which is great.  I have sent over Meclizine to take for your dizziness. This could make you drowsy, so ok to start with 1/2 pill first and then take a full pill if not helping dizziness.   Continue to monitor your blood pressure and if running higher than 140/90 and having dizziness or the full headed feeling, take an extra Amlodipine pill and follow up with your primary provider.     PLEASE NOTE:  If you had any lab tests please let us know if you have not heard back within a few days. You may see your results on MyChart before we have a chance to review them but we will give you a call once they are reviewed by [place]. If we ordered any referrals today, please let us know if you have not heard from their office within the next week.

## 2021-09-11 NOTE — Progress Notes (Signed)
Patient ID: Colleen Coffey, female    DOB: 03-Jan-1951, 71 y.o.   MRN: [id number]  Chief Complaint  Patient presents with   Dizziness    Pt c/o dizziness two weeks ago and yesterday it started again, Explains off balance but no room spinning and she goes to sleep and wake but then she would be lightheaded when she wakes up in the morning.     HPI: Dizziness:  reports drinking 64oz water, not skipping meals, denies actual dizziness, denies HA or nausea. Denies constipation, has daily soft BM, denies vision changes. States she feels tired and could easily take a nap. Reports feeling a foggy headed/full headed feeling.  BP slightly high today, but reports 130s/80-90 at home.    Assessment & Plan:  1. Dizziness reports having one day 2 weeks ago, and then happened again today. Reports her BP this am was 136/80, denies room spinning, but dizzy with eye exam. Advised to take Meclizine x 2 doses today, then if no sx in am do not take. Just take prn. F/U with PCP.  - EKG 12-Lead - meclizine (ANTIVERT) 12.5 MG tablet; Take 0.5-1 tablets (6.25-12.5 mg total) by mouth 3 (three) times daily as needed for dizziness.  Dispense: 30 tablet; Refill: 0  2. Irregular heart rhythm heart rhythm normal for so many beats, then skips a beat, ECG with NSR.  - EKG 12-Lead    Subjective:    Outpatient Medications Prior to Visit  Medication Sig Dispense Refill   acetaminophen (TYLENOL) 325 MG tablet Take 2 tablets (650 mg total) by mouth every 6 (six) hours as needed for mild pain (or temp > 100).     amLODipine (NORVASC) 2.5 MG tablet Take 1 tablet (2.5 mg total) by mouth daily. 60 tablet 0   Cholecalciferol (VITAMIN D3 PO) Take 1,000 Units by mouth daily.     Magnesium Hydroxide (DULCOLAX PO) Take by mouth.     METAMUCIL FIBER PO Take by mouth.     polyethylene glycol (MIRALAX) 17 g packet Take 17 g by mouth daily.     rosuvastatin (CRESTOR) 5 MG tablet Take 1 tablet (5 mg total) by mouth daily. 90  tablet 0   FLUZONE HIGH-DOSE QUADRIVALENT 0.7 ML SUSY      PFIZER COVID-19 VAC BIVALENT injection      tizanidine (ZANAFLEX) 2 MG capsule TAKE 1-2 CAPSULES (2-4 MG TOTAL) BY MOUTH AT BEDTIME AS NEEDED FOR MUSCLE SPASMS. (Patient not taking: Reported on 09/11/2021) 30 capsule 0   No facility-administered medications prior to visit.   Past Medical History:  Diagnosis Date   Constipation    chronic, improved with healthy diet   Endocervical polyp    GERD (gastroesophageal reflux disease)    Hemorrhoids    resolved   Hypertension    Osteoarthritis, knee 06/27/2015   -saw [facility]    Osteopenia 06/27/2015   -reports on medication remotely with gyn    Past Surgical History:  Procedure Laterality Date   APPENDECTOMY     BACK SURGERY  2014   bulging disc lumbar spine   BREAST CYST ASPIRATION Right    CESAREAN SECTION     2   CHOLECYSTECTOMY N/A 11/16/2020   Procedure: LAPAROSCOPIC CHOLECYSTECTOMY;  Surgeon: Jovita Kussmaul, MD;  Location: [facility];  Service: General;  Laterality: N/A;   No Known Allergies    Objective:    Physical Exam Vitals and nursing note reviewed.  Constitutional:      Appearance: Normal  appearance.  Eyes:     Extraocular Movements:     Right eye: Normal extraocular motion (pt reports dizziness with movement).     Left eye: Normal extraocular motion.  Cardiovascular:     Rate and Rhythm: Normal rate. Rhythm irregular.     Comments: heart rhythm regular for 8-10 beats, then stops for a beat Pulmonary:     Effort: Pulmonary effort is normal.     Breath sounds: Normal breath sounds.  Musculoskeletal:        General: Normal range of motion.  Skin:    General: Skin is warm and dry.  Neurological:     Mental Status: She is alert.  Psychiatric:        Mood and Affect: Mood normal.        Behavior: Behavior normal.    BP (!) 141/92 (BP Location: Left Arm, Patient Position: Sitting, Cuff Size: Large)   Pulse 70   Temp 97.7 F (36.5 C) (Temporal)    Ht 4' (1.219 m)   Wt 172 lb 8 oz (78.2 kg)   LMP 01/11/2017 (Exact Date)   SpO2 99%   BMI 52.64 kg/m  Wt Readings from Last 3 Encounters:  09/11/21 172 lb 8 oz (78.2 kg)  04/28/21 167 lb 1.6 oz (75.8 kg)  11/16/20 168 lb 10.4 oz (76.5 kg)      Jeanie Sewer, NP

## 2021-09-13 ENCOUNTER — Ambulatory Visit: Payer: No Typology Code available for payment source | Admitting: Family

## 2021-09-27 ENCOUNTER — Telehealth: Payer: Self-pay | Admitting: Family Medicine

## 2021-09-27 ENCOUNTER — Other Ambulatory Visit: Payer: Self-pay | Admitting: Family

## 2021-09-27 NOTE — Telephone Encounter (Signed)
Refills should be done by her PCP, I saw her for an acute visit, thx.

## 2021-09-27 NOTE — Telephone Encounter (Signed)
Pt states: -leaving town on Friday 08/18 -Does not have enough Rx for when the 08/21 refill is available.  -asking for early refill so can have medication on hand during traveling.    LAST APPOINTMENT DATE:   09/11/21 OV with [facility]    NEXT APPOINTMENT DATE: 10/18/21 TOC to [facility] provider   MEDICATION: meclizine (ANTIVERT) 12.5 MG tablet [[id number]]    Is the patient out of medication?  Has 3 left   [facility]ARMACY: [id number] , [place] - [address] [place] Phone:  [phone] Fax:  [phone] DEA #:  [id number]

## 2021-09-28 ENCOUNTER — Encounter: Payer: Self-pay | Admitting: Family Medicine

## 2021-09-28 ENCOUNTER — Ambulatory Visit (INDEPENDENT_AMBULATORY_CARE_PROVIDER_SITE_OTHER): Payer: No Typology Code available for payment source | Admitting: Family Medicine

## 2021-09-28 DIAGNOSIS — R42 Dizziness and giddiness: Secondary | ICD-10-CM

## 2021-09-28 MED ORDER — MECLIZINE HCL 12.5 MG PO TABS: 6.2500 mg | ORAL_TABLET | Freq: Three times a day (TID) | ORAL | 0 refills | Status: DC | PRN

## 2021-09-28 NOTE — Telephone Encounter (Signed)
Error/njr °

## 2021-09-28 NOTE — Patient Instructions (Signed)
You can try Nuribiotic ear drops with grapefruit seed extract and tea tree oil to help with your dizziness/vertigo symptoms.  1 fl oz is around $6.99.  It can be found at your local health food store or online.  Decrease the amount of sodium you are eating as this can contribute to your symptoms.

## 2021-09-28 NOTE — Progress Notes (Signed)
Subjective:    Patient ID: Colleen Coffey, female    DOB: 12/15/1950, 71 y.o.   MRN: [id number]  Chief Complaint  Patient presents with   Dizziness    Was going good until this past Monday, then got worse. Had stopped taking the medication, but started it again. Medication is helping, but only has a few pills left and has plans to go out of town.dr Felipe Drone said she did not present signs of vertigo.    HPI Patient was seen today for acute concern.  Pt with dizziness.  Seen 731/23 by Jeanie Sewer, NP for dizziness, given meclizine.  Patient took meclizine for 1 week with improvement in symptoms.  States symptoms return on Tuesday of this week.  Patient concerned that she is going out of town to [facility] for 1 week.  Patient states she feels like she is off balance and dizzy, head feels cool, like she might faint.  Patient does not recall any changes in medications.  Typically drinks 48-64 ounces of water per day.  Patient denies seasonal allergies.  Pt mentions BP better today than at previous visits.  Patient endorses increased fast food intake over the last week.  Had chicken tenders from [facility], fries.  Past Medical History:  Diagnosis Date   Constipation    chronic, improved with healthy diet   Endocervical polyp    GERD (gastroesophageal reflux disease)    Hemorrhoids    resolved   Hypertension    Osteoarthritis, knee 06/27/2015   -saw [facility]    Osteopenia 06/27/2015   -reports on medication remotely with gyn     No Known Allergies  ROS General: Denies fever, chills, night sweats, changes in weight, changes in appetite +dizziness HEENT: Denies headaches, ear pain, changes in vision, rhinorrhea, sore throat CV: Denies CP, palpitations, SOB, orthopnea Pulm: Denies SOB, cough, wheezing GI: Denies abdominal pain, nausea, vomiting, diarrhea, constipation GU: Denies dysuria, hematuria, frequency, vaginal discharge Msk: Denies muscle cramps, joint pains Neuro:  Denies weakness, numbness, tingling Skin: Denies rashes, bruising Psych: Denies depression, anxiety, hallucinations     Objective:    Blood pressure 132/78, pulse 84, temperature 98.7 F (37.1 C), temperature source Oral, weight 175 lb 6.4 oz (79.6 kg), last menstrual period 01/11/2017, SpO2 96 %.  Gen. Pleasant, well-nourished, in no distress, normal affect   HEENT: [place]/AT, face symmetric, conjunctiva clear, no scleral icterus, PERRLA, EOMI, 2-3 beats of nystagmus with right and left gaze.  No TTP of sinuses.  Nares patent without drainage, pharynx without erythema or exudate.  TMs normal bilaterally.  No cervical lymphadenopathy.  Epley's maneuver performed. Lungs: no accessory muscle use, CTAB, no wheezes or rales Cardiovascular: RRR, no m/r/g, no peripheral edema Neuro:  A&Ox3, CN II-XII intact, normal gait Skin:  Warm, no lesions/ rash   Wt Readings from Last 3 Encounters:  09/28/21 175 lb 6.4 oz (79.6 kg)  09/11/21 172 lb 8 oz (78.2 kg)  04/28/21 167 lb 1.6 oz (75.8 kg)    Lab Results  Component Value Date   WBC 6.5 04/28/2021   HGB 13.6 04/28/2021   HCT 40.7 04/28/2021   PLT 366.0 04/28/2021   GLUCOSE 83 04/28/2021   CHOL 208 (H) 04/28/2021   TRIG 72.0 04/28/2021   HDL 80.60 04/28/2021   LDLDIRECT 117.8 01/02/2010   LDLCALC 113 (H) 04/28/2021   ALT 31 04/28/2021   AST 39 (H) 04/28/2021   NA 140 04/28/2021   K 4.2 04/28/2021   CL 102 04/28/2021  CREATININE 0.74 04/28/2021   BUN 21 04/28/2021   CO2 32 04/28/2021   TSH 1.93 02/01/2014   HGBA1C 6.0 04/28/2021    Assessment/Plan:  Dizziness -Symptoms likely 2/2 recent increased sodium intake causing elevation in BP and likely dehydration. -Patient advised to avoid eating fast food when able. -Increase p.o. intake of water and fluids -Consent obtained.  Epley maneuver performed in clinic.  Patient tolerated well. -Given handout -Meclizine refilled -Consider OTC Nutribiotic eardrops to help with symptoms. -  Plan: meclizine (ANTIVERT) 12.5 MG tablet  F/u as needed.  Patient to establish with new PCP.  Grier Mitts, MD

## 2021-09-28 NOTE — Telephone Encounter (Signed)
Lvm for pt letting her know she will need to contact her PCP for a refill.

## 2021-10-12 ENCOUNTER — Other Ambulatory Visit: Payer: Self-pay | Admitting: Family Medicine

## 2021-10-12 ENCOUNTER — Other Ambulatory Visit: Payer: Self-pay | Admitting: Physician Assistant

## 2021-10-12 DIAGNOSIS — M8580 Other specified disorders of bone density and structure, unspecified site: Secondary | ICD-10-CM

## 2021-10-12 DIAGNOSIS — E2839 Other primary ovarian failure: Secondary | ICD-10-CM

## 2021-10-17 ENCOUNTER — Telehealth: Payer: Self-pay | Admitting: Family Medicine

## 2021-10-17 ENCOUNTER — Other Ambulatory Visit: Payer: No Typology Code available for payment source

## 2021-10-17 NOTE — Telephone Encounter (Signed)
Pt has appt today at 3 pm for bone density test  and the order has been sent to [facility] and needs to be sign

## 2021-10-18 ENCOUNTER — Encounter: Payer: Self-pay | Admitting: Family Medicine

## 2021-10-18 ENCOUNTER — Ambulatory Visit (INDEPENDENT_AMBULATORY_CARE_PROVIDER_SITE_OTHER): Payer: No Typology Code available for payment source | Admitting: Family Medicine

## 2021-10-18 VITALS — BP 128/70 | HR 77 | Temp 97.5°F | Wt 176.1 lb

## 2021-10-18 DIAGNOSIS — Z1231 Encounter for screening mammogram for malignant neoplasm of breast: Secondary | ICD-10-CM | POA: Diagnosis not present

## 2021-10-18 DIAGNOSIS — I10 Essential (primary) hypertension: Secondary | ICD-10-CM

## 2021-10-18 DIAGNOSIS — M791 Myalgia, unspecified site: Secondary | ICD-10-CM | POA: Diagnosis not present

## 2021-10-18 DIAGNOSIS — R7303 Prediabetes: Secondary | ICD-10-CM

## 2021-10-18 LAB — CK: Total CK: 75 U/L (ref 7–177)

## 2021-10-18 LAB — COMPREHENSIVE METABOLIC PANEL
ALT: 26 U/L (ref 0–35)
AST: 26 U/L (ref 0–37)
Albumin: 4.1 g/dL (ref 3.5–5.2)
Alkaline Phosphatase: 73 U/L (ref 39–117)
BUN: 12 mg/dL (ref 6–23)
CO2: 30 mEq/L (ref 19–32)
Calcium: 10 mg/dL (ref 8.4–10.5)
Chloride: 102 mEq/L (ref 96–112)
Creatinine, Ser: 0.73 mg/dL (ref 0.40–1.20)
GFR: 82.26 mL/min (ref >60.00)
Glucose, Bld: 91 mg/dL (ref 70–99)
Potassium: 4.7 mEq/L (ref 3.5–5.1)
Sodium: 138 mEq/L (ref 135–145)
Total Bilirubin: 0.7 mg/dL (ref 0.2–1.2)
Total Protein: 8 g/dL (ref 6.0–8.3)

## 2021-10-18 LAB — LIPID PANEL
Cholesterol: 191 mg/dL (ref 0–200)
HDL: 79.8 mg/dL (ref >39.00)
LDL Cholesterol: 91 mg/dL (ref 0–99)
NonHDL: 110.73
Total CHOL/HDL Ratio: 2
Triglycerides: 98 mg/dL (ref 0.0–149.0)
VLDL: 19.6 mg/dL (ref 0.0–40.0)

## 2021-10-18 LAB — HEMOGLOBIN A1C: Hgb A1c MFr Bld: 5.9 % (ref 4.6–6.5)

## 2021-10-18 MED ORDER — AMLODIPINE BESYLATE 2.5 MG PO TABS: 2.5000 mg | ORAL_TABLET | Freq: Every day | ORAL | 1 refills | Status: DC

## 2021-10-18 NOTE — Patient Instructions (Addendum)
STOP crestor for 2 weeks. Monitor your symptoms to see if the muscle soreness disappears. If it improves/ resolves, then it is likely that the crestor is causing the problem. Please call me in 2-3 weeks, or leave me a message on MyChart to let me know. After that we may try Pravachol instead.

## 2021-10-18 NOTE — Progress Notes (Signed)
Established Patient Office Visit  Subjective   Patient ID: Leeasia Secrist, female    DOB: [birth year]  Age: 71 y.o. MRN: [id number]  Chief Complaint  Patient presents with   Establish Care    Patient is here for transition of care. We reviewed her most recent history of dizziness that she reported last month. She rpeorts that it is getting some better, the episodes are brief and they are becoming less frequent, she continues to take the meclizine as needed for these episodes. She denies any stroke like symptoms, states that it all started when she bent over to pick something up.   Patient is reporting a 6 month history of increasing leg pain, states that she did increase her physical activity about 6 months ago and she thought it was from that however the soreness in her legs and arms have not improved over time. States she has to take tylenol after a work out. We reviewed her medications and I asked her if she started on the statin medication about 6 months ago and she confirmed this, states that was around the time that the muscle soreness/cramps started. We had a long discussion about this.  HTN -- BP in office performed and is well controlled. She reports no side effects to the medications, no chest pain, SOB, dizziness or headaches. She has a BP cuff at home and is checking her BP regularly, reports they are in the normal range.  She is due for her annual labwork today.   Current Outpatient Medications  Medication Instructions   acetaminophen (TYLENOL) 650 mg, Oral, Every 6 hours PRN   amLODipine (NORVASC) 2.5 mg, Oral, Daily   Cholecalciferol (VITAMIN D3 PO) 1,000 Units, Oral, Daily   Magnesium Hydroxide (DULCOLAX PO) Oral   meclizine (ANTIVERT) 6.25-12.5 mg, Oral, 3 times daily PRN   METAMUCIL FIBER PO Oral   polyethylene glycol (MIRALAX) 17 g, Oral, Daily       Review of Systems  Constitutional:  Negative for chills, fever and malaise/fatigue.  Musculoskeletal:  Positive  for joint pain and myalgias.  Neurological:  Negative for dizziness and headaches.  All other systems reviewed and are negative.     Objective:     BP 128/70 (BP Location: Left Arm, Patient Position: Sitting, Cuff Size: Large)   Pulse 77   Temp (!) 97.5 F (36.4 C) (Oral)   Wt 176 lb 1.6 oz (79.9 kg)   LMP 01/11/2017 (Exact Date)   SpO2 98%   BMI 53.74 kg/m    Physical Exam Vitals reviewed.  Constitutional:      Appearance: Normal appearance. She is well-groomed and normal weight.  HENT:     Head: Normocephalic and atraumatic.  Eyes:     Extraocular Movements: Extraocular movements intact.     Conjunctiva/sclera: Conjunctivae normal.  Cardiovascular:     Rate and Rhythm: Normal rate and regular rhythm.     Pulses: Normal pulses.     Heart sounds: S1 normal and S2 normal.  Pulmonary:     Effort: Pulmonary effort is normal.     Breath sounds: Normal breath sounds and air entry.  Abdominal:     General: Abdomen is flat. Bowel sounds are normal.     Palpations: Abdomen is soft.  Musculoskeletal:        General: Normal range of motion.     Right lower leg: No edema.     Left lower leg: No edema.  Skin:    General: Skin is warm  and dry.  Neurological:     Mental Status: She is alert and oriented to person, place, and time. Mental status is at baseline.     Gait: Gait is intact.  Psychiatric:        Mood and Affect: Mood and affect normal.        Speech: Speech normal.        Behavior: Behavior normal.        Judgment: Judgment normal.       The 10-year ASCVD risk score (Arnett DK, et al., 2019) is: 13.7%    Assessment & Plan:   Problem List Items Addressed This Visit       Cardiovascular and Mediastinum   Hypertension    Current hypertension medications:       Sig   amLODipine (NORVASC) 2.5 MG tablet Take 1 tablet (2.5 mg total) by mouth daily.  BP is well controlled on the amlodipine listed above, will continue this medication and order bloodwork  today.      Relevant Medications   amLODipine (NORVASC) 2.5 MG tablet   Other Relevant Orders   Lipid Panel (Completed)   CMP (Completed)     Other   Muscle pain - Primary    Most likely secondary to statin use, I am ordering a CK today to look for evidence of muscle breakdown. I advised the patient to STOP the crestor 5 mg daily and to wait 2 weeks to see if the muscle soreness resolves. If it does then it is likely caused by the statin. Patient instructed to send me a message in 2 weeks, then we may decide to try a lower potency statin such as pravachol.      Relevant Orders   CK (Completed)   Prediabetes (Chronic)    Last A1C was reviewed, she needs a follow up A1C, we discussed lowering sugar and starches in her diet.       Relevant Orders   Hemoglobin A1c (Completed)   Other Visit Diagnoses     Encounter for screening mammogram for malignant neoplasm of breast       Relevant Orders   MM Digital Screening      We reviewed her health maintenance measures and she is due for her mammo in December. order placed. Return in about 6 months (around 04/18/2022) for Annual preventative health visit.    Farrel Conners, MD

## 2021-10-19 DIAGNOSIS — M791 Myalgia, unspecified site: Secondary | ICD-10-CM | POA: Insufficient documentation

## 2021-10-19 DIAGNOSIS — R7303 Prediabetes: Secondary | ICD-10-CM | POA: Insufficient documentation

## 2021-10-19 NOTE — Assessment & Plan Note (Signed)
Most likely secondary to statin use, I am ordering a CK today to look for evidence of muscle breakdown. I advised the patient to STOP the crestor 5 mg daily and to wait 2 weeks to see if the muscle soreness resolves. If it does then it is likely caused by the statin. Patient instructed to send me a message in 2 weeks, then we may decide to try a lower potency statin such as pravachol.

## 2021-10-19 NOTE — Assessment & Plan Note (Signed)
Current hypertension medications:      Sig   amLODipine (NORVASC) 2.5 MG tablet Take 1 tablet (2.5 mg total) by mouth daily.     BP is well controlled on the amlodipine listed above, will continue this medication and order bloodwork today.

## 2021-10-19 NOTE — Assessment & Plan Note (Signed)
Last A1C was reviewed, she needs a follow up A1C, we discussed lowering sugar and starches in her diet.

## 2021-10-20 ENCOUNTER — Ambulatory Visit (INDEPENDENT_AMBULATORY_CARE_PROVIDER_SITE_OTHER): Payer: No Typology Code available for payment source

## 2021-10-20 ENCOUNTER — Encounter: Payer: Self-pay | Admitting: Gastroenterology

## 2021-10-20 ENCOUNTER — Ambulatory Visit (INDEPENDENT_AMBULATORY_CARE_PROVIDER_SITE_OTHER): Payer: No Typology Code available for payment source | Admitting: Gastroenterology

## 2021-10-20 VITALS — BP 142/80 | HR 76 | Ht 60.0 in | Wt 175.5 lb

## 2021-10-20 DIAGNOSIS — K648 Other hemorrhoids: Secondary | ICD-10-CM | POA: Diagnosis not present

## 2021-10-20 DIAGNOSIS — K5904 Chronic idiopathic constipation: Secondary | ICD-10-CM | POA: Diagnosis not present

## 2021-10-20 DIAGNOSIS — Z23 Encounter for immunization: Secondary | ICD-10-CM

## 2021-10-20 NOTE — Progress Notes (Signed)
    Assessment     Chronic idiopathic constipation  Internal hemorrhoids with intermittent bleeding. Intestinal gas   Recommendations    Continue Metamucil and/or MiraLAX daily for long-term management of constipation Preparation H suppositories PR daily as needed Low gas diet and Gas-X 4 times daily as needed   HPI    This is a 71 year old female with ongoing constipation.  She relates that using either MiraLAX or Metamucil is effective for constipation she notes ongoing problems with intestinal gas that seems to build over the course of the day.  She has intermittent small amounts of bright red blood per rectum with bowel movements.  Colonoscopy June 2021 - One 4 mm polyp in the ascending colon, removed with a cold biopsy forceps. Resected and retrieved. Path: lymphoid tissue - Internal hemorrhoids. - The examination was otherwise normal on direct and retroflexion views.   Labs / Imaging       Latest Ref Rng & Units 10/18/2021   10:09 AM 04/28/2021   10:52 AM 11/17/2020   10:00 AM  Hepatic Function  Total Protein 6.0 - 8.3 g/dL 8.0  8.0  5.9   Albumin 3.5 - 5.2 g/dL 4.1  4.3  2.6   AST 0 - 37 U/L 26  39  80   ALT 0 - 35 U/L 26  31  201   Alk Phosphatase 39 - 117 U/L 73  59  110   Total Bilirubin 0.2 - 1.2 mg/dL 0.7  0.5  0.6        Latest Ref Rng & Units 04/28/2021   10:52 AM 11/16/2020    1:15 AM 11/14/2020    9:25 PM  CBC  WBC 4.0 - 10.5 K/uL 6.5  12.5  9.7   Hemoglobin 12.0 - 15.0 g/dL 13.6  12.8  12.7   Hematocrit 36.0 - 46.0 % 40.7  37.9  38.2   Platelets 150.0 - 400.0 K/uL 366.0  394  403     Current Medications, Allergies, Past Medical History, Past Surgical History, Family History and Social History were reviewed in [employer] record.   Physical Exam: General: Well developed, well nourished, no acute distress Head: Normocephalic and atraumatic Eyes: Sclerae anicteric, EOMI Ears: Normal auditory acuity Mouth: Not  examined Lungs: Clear throughout to auscultation Heart: Regular rate and rhythm; no murmurs, rubs or bruits Abdomen: Soft, non tender and non distended. No masses, hepatosplenomegaly or hernias noted. Normal Bowel sounds Rectal: Not done Musculoskeletal: Symmetrical with no gross deformities  Pulses:  Normal pulses noted Extremities: No clubbing, cyanosis, edema or deformities noted Neurological: Alert oriented x 4, grossly nonfocal Psychological:  Alert and cooperative. Normal mood and affect   Danetta Prom T. Fuller Plan, MD 10/20/2021, 8:36 AM

## 2021-10-20 NOTE — Patient Instructions (Signed)
Continue Miralax and Metamucil daily.   Take over the counter Gas-x four times a day as needed.   You have been given a gas prevention diet.  Use over the counter preparation H suppositories daily .  Call if your symptoms are not better.   The [facility] providers would like to encourage you to use [facility] to communicate with providers for non-urgent requests or questions.  Due to long hold times on the telephone, sending your provider a message by [facility] may be a faster and more efficient way to get a response.  Please allow 48 business hours for a response.  Please remember that this is for non-urgent requests.   Thank you for choosing me and [facility].  Pricilla Riffle. Dagoberto Ligas., MD., [facility] Regal

## 2021-11-13 ENCOUNTER — Encounter: Payer: Self-pay | Admitting: Family Medicine

## 2021-11-13 ENCOUNTER — Ambulatory Visit (INDEPENDENT_AMBULATORY_CARE_PROVIDER_SITE_OTHER): Payer: No Typology Code available for payment source

## 2021-11-13 ENCOUNTER — Ambulatory Visit (INDEPENDENT_AMBULATORY_CARE_PROVIDER_SITE_OTHER): Payer: No Typology Code available for payment source | Admitting: Family Medicine

## 2021-11-13 VITALS — BP 180/86 | HR 80 | Temp 98.6°F | Ht 60.0 in | Wt 175.7 lb

## 2021-11-13 DIAGNOSIS — M25552 Pain in left hip: Secondary | ICD-10-CM

## 2021-11-13 DIAGNOSIS — M160 Bilateral primary osteoarthritis of hip: Secondary | ICD-10-CM | POA: Diagnosis not present

## 2021-11-13 DIAGNOSIS — M25551 Pain in right hip: Secondary | ICD-10-CM | POA: Diagnosis not present

## 2021-11-13 MED ORDER — CELECOXIB 200 MG PO CAPS: 200.0000 mg | ORAL_CAPSULE | Freq: Every day | ORAL | 1 refills | Status: DC

## 2021-11-13 NOTE — Progress Notes (Signed)
Established Patient Office Visit  Subjective   Patient ID: Colleen Coffey, female    DOB: 07/04/1950  Age: 71 y.o. MRN: [id number]  Chief Complaint  Patient presents with   Medication Problem    Patient states she discontinued the Rosuvastatin and muscle pain is better; however she complains of recurrent pain in her hips-back    Patient was seen 2 weeks ago for a TOC visit, patient reports that after she stopped the cholesterol medication her leg pain/ soreness did get better, but she is still having pain in her hips and lower back, states the pain is mostly in her buttock and goes around the side of her hips and down the back of her legs. It mostly happens when she is standing and walking. Sitting improves the pain.   HTN -- pt was checked twice and BP is elevated, she reports that she stopped her BP medication for her bone density test that is scheduled for tomorrow.   Current Outpatient Medications  Medication Instructions   acetaminophen (TYLENOL) 650 mg, Oral, Every 6 hours PRN   amLODipine (NORVASC) 2.5 mg, Oral, Daily   celecoxib (CELEBREX) 200 mg, Oral, Daily   Cholecalciferol (VITAMIN D3 PO) 1,000 Units, Oral, Daily   Magnesium Hydroxide (DULCOLAX PO) Oral   meclizine (ANTIVERT) 6.25-12.5 mg, Oral, 3 times daily PRN   METAMUCIL FIBER PO 1 Dose, Oral, Daily   polyethylene glycol (MIRALAX) 17 g, Oral, Daily    Patient Active Problem List   Diagnosis Date Noted   Muscle pain 10/19/2021   Prediabetes 10/19/2021   Hypertension 04/27/2020   Osteoarthritis, knee 06/27/2015   BMI 28.0-28.9,adult 06/27/2015   Constipation 06/27/2015   Osteopenia 06/27/2015      Review of Systems  All other systems reviewed and are negative.     Objective:     BP (!) 180/86   Pulse 80   Temp 98.6 F (37 C) (Oral)   Ht 5' (1.524 m)   Wt 175 lb 11.2 oz (79.7 kg)   LMP 01/11/2017 (Exact Date)   SpO2 98%   BMI 34.31 kg/m  BP Readings from Last 3 Encounters:  11/13/21 (!)  180/86  10/20/21 (!) 142/80  10/18/21 128/70      Physical Exam Vitals reviewed.  Constitutional:      Appearance: Normal appearance. She is well-groomed and normal weight.  HENT:     Head: Normocephalic and atraumatic.  Eyes:     Conjunctiva/sclera: Conjunctivae normal.  Cardiovascular:     Rate and Rhythm: Normal rate and regular rhythm.     Pulses: Normal pulses.     Heart sounds: S1 normal and S2 normal.  Pulmonary:     Effort: Pulmonary effort is normal.     Breath sounds: Normal breath sounds and air entry.  Abdominal:     General: Bowel sounds are normal.  Musculoskeletal:        General: Normal range of motion.     Cervical back: Normal range of motion and neck supple.     Right lower leg: No edema.     Left lower leg: No edema.  Skin:    General: Skin is warm and dry.  Neurological:     Mental Status: She is alert and oriented to person, place, and time. Mental status is at baseline.     Gait: Gait is intact.  Psychiatric:        Mood and Affect: Mood and affect normal.  Speech: Speech normal.        Behavior: Behavior normal.        Judgment: Judgment normal.     No results found for any visits on 11/13/21.    The 10-year ASCVD risk score (Arnett DK, et al., 2019) is: 23.7%    Assessment & Plan:   Problem List Items Addressed This Visit   None Visit Diagnoses     Bilateral hip pain    -  Primary   Relevant Medications   celecoxib (CELEBREX) 200 MG capsule   Other Relevant Orders   DG HIPS BILAT WITH PELVIS 3-4 VIEWS  Patient reports that the muscle soreness did improve off the statin, however her lower hip/buttock/back pain are still present, especially with standing and walking. We discussed that this is likely MSK in origin, will start work up with x-rays and will start once daily celecoxib 200 mg daily to help with the pain. She may require physical therapy referral in the future.     No follow-ups on file.    Farrel Conners,  MD

## 2021-11-14 ENCOUNTER — Ambulatory Visit
Admission: RE | Admit: 2021-11-14 | Discharge: 2021-11-14 | Disposition: A | Discharge status: Home / Self Care | Payer: No Typology Code available for payment source | Source: Ambulatory Visit | Attending: Family Medicine | Admitting: Family Medicine

## 2021-11-14 DIAGNOSIS — E2839 Other primary ovarian failure: Secondary | ICD-10-CM

## 2021-11-14 DIAGNOSIS — M8580 Other specified disorders of bone density and structure, unspecified site: Secondary | ICD-10-CM

## 2021-11-14 DIAGNOSIS — M8589 Other specified disorders of bone density and structure, multiple sites: Secondary | ICD-10-CM | POA: Diagnosis not present

## 2021-11-14 DIAGNOSIS — Z780 Asymptomatic menopausal state: Secondary | ICD-10-CM | POA: Diagnosis not present

## 2021-11-15 NOTE — Progress Notes (Signed)
X-rays of the hips and pelvis were relatively normal. I recommend referral to physical therapy for a full evaluation and treatment. Please ask pt if she is agreable to the referral and where she would like to go for therapy.

## 2021-11-21 ENCOUNTER — Telehealth: Payer: Self-pay | Admitting: Family Medicine

## 2021-11-21 NOTE — Telephone Encounter (Signed)
Pt stated that she has been monitoring her BP for 2 wks now and it is still high. Her numbers for the past 3 days have been   147/74 154/80 152/78  Sent pt to triage and pt is inspected a response for what she needs to do from the Dr.  Please advise.

## 2021-11-21 NOTE — Telephone Encounter (Signed)
Patient should increase her amlodipine to 5 mg daily (2 tablets of the 2.5 mg dose she has at home) then have her return to the office for a BP check in about 2-3 weeks

## 2021-11-21 NOTE — Telephone Encounter (Signed)
Spoke with the patient and informed her of the message below.  Follow up appt scheduled on 10/24.

## 2021-11-21 NOTE — Telephone Encounter (Signed)
Left a message for the patient to return my call.  

## 2021-11-23 ENCOUNTER — Telehealth: Payer: Self-pay | Admitting: Family Medicine

## 2021-11-23 NOTE — Telephone Encounter (Signed)
She was having muscle soreness which improved with stopping the statin so yes I advised her not to restart it-- she might try taking it every other day to see if the muscle soreness returns

## 2021-11-23 NOTE — Telephone Encounter (Signed)
Colleen Coffey with [facility] is calling to verify if rosuvastatin 5 mg has been d/c for this patient

## 2021-11-24 NOTE — Telephone Encounter (Signed)
Form completed by PCP, this was faxed to [place] at [phone] and sent to be scanned.

## 2021-12-05 ENCOUNTER — Encounter: Payer: Self-pay | Admitting: Family Medicine

## 2021-12-05 ENCOUNTER — Ambulatory Visit: Payer: No Typology Code available for payment source

## 2021-12-05 ENCOUNTER — Ambulatory Visit (INDEPENDENT_AMBULATORY_CARE_PROVIDER_SITE_OTHER): Payer: No Typology Code available for payment source | Admitting: Family Medicine

## 2021-12-05 ENCOUNTER — Ambulatory Visit (INDEPENDENT_AMBULATORY_CARE_PROVIDER_SITE_OTHER): Payer: No Typology Code available for payment source

## 2021-12-05 VITALS — BP 148/70 | HR 80 | Temp 98.2°F | Ht 60.0 in | Wt 178.0 lb

## 2021-12-05 VITALS — BP 148/70 | HR 80 | Temp 98.2°F | Ht 60.0 in | Wt 178.3 lb

## 2021-12-05 DIAGNOSIS — Z0000 Encounter for general adult medical examination without abnormal findings: Secondary | ICD-10-CM | POA: Diagnosis not present

## 2021-12-05 DIAGNOSIS — I10 Essential (primary) hypertension: Secondary | ICD-10-CM | POA: Diagnosis not present

## 2021-12-05 MED ORDER — AMLODIPINE BESYLATE 10 MG PO TABS: 10.0000 mg | ORAL_TABLET | Freq: Every day | ORAL | 1 refills | Status: DC

## 2021-12-05 NOTE — Assessment & Plan Note (Addendum)
Is currently on 2 tablets (5 mg of the amlodipine) BP at home is better however today in office it is 152/70. Repeat BP is 148/70. Will increase her dose to 10 mg daily at bedtime and see her back in 2 months for repeat BP check. Once her BP is under control then will space out her visits to every 6 months.

## 2021-12-05 NOTE — Patient Instructions (Signed)
Colleen Coffey , Thank you for taking time to come for your Medicare Wellness Visit. I appreciate your ongoing commitment to your health goals. Please review the following plan we discussed and let me know if I can assist you in the future.   Screening recommendations/referrals: Colonoscopy: completed 08/10/2019, due 08/09/2029 Mammogram: scheduled for 02/13/2022 Bone Density: completed 11/14/2021 Recommended yearly ophthalmology/optometry visit for glaucoma screening and checkup Recommended yearly dental visit for hygiene and checkup  Vaccinations: Influenza vaccine: completed 10/20/2021 Pneumococcal vaccine: completed 04/28/2021 Tdap vaccine: completed 04/25/2019, due 04/24/2029 Shingles vaccine: completed   Covid-19: 11/09/2021, 10/20/2020, 11/13/2019, 04/11/2019, 03/21/2019  Advanced directives: Please bring a copy of your POA (Power of Attorney) and/or Living Will to your next appointment.   Conditions/risks identified: none  Next appointment: Follow up in one year for your annual wellness visit    Preventive Care 65 Years and Older, Female Preventive care refers to lifestyle choices and visits with your health care provider that can promote health and wellness. What does preventive care include? A yearly physical exam. This is also called an annual well check. Dental exams once or twice a year. Routine eye exams. Ask your health care provider how often you should have your eyes checked. Personal lifestyle choices, including: Daily care of your teeth and gums. Regular physical activity. Eating a healthy diet. Avoiding tobacco and drug use. Limiting alcohol use. Practicing safe sex. Taking low-dose aspirin every day. Taking vitamin and mineral supplements as recommended by your health care provider. What happens during an annual well check? The services and screenings done by your health care provider during your annual well check will depend on your age, overall health, lifestyle risk  factors, and family history of disease. Counseling  Your health care provider may ask you questions about your: Alcohol use. Tobacco use. Drug use. Emotional well-being. Home and relationship well-being. Sexual activity. Eating habits. History of falls. Memory and ability to understand (cognition). Work and work Statistician. Reproductive health. Screening  You may have the following tests or measurements: Height, weight, and BMI. Blood pressure. Lipid and cholesterol levels. These may be checked every 5 years, or more frequently if you are over 53 years old. Skin check. Lung cancer screening. You may have this screening every year starting at age 55 if you have a 30-pack-year history of smoking and currently smoke or have quit within the past 15 years. Fecal occult blood test (FOBT) of the stool. You may have this test every year starting at age 59. Flexible sigmoidoscopy or colonoscopy. You may have a sigmoidoscopy every 5 years or a colonoscopy every 10 years starting at age 47. Hepatitis C blood test. Hepatitis B blood test. Sexually transmitted disease (STD) testing. Diabetes screening. This is done by checking your blood sugar (glucose) after you have not eaten for a while (fasting). You may have this done every 1-3 years. Bone density scan. This is done to screen for osteoporosis. You may have this done starting at age 31. Mammogram. This may be done every 1-2 years. Talk to your health care provider about how often you should have regular mammograms. Talk with your health care provider about your test results, treatment options, and if necessary, the need for more tests. Vaccines  Your health care provider may recommend certain vaccines, such as: Influenza vaccine. This is recommended every year. Tetanus, diphtheria, and acellular pertussis (Tdap, Td) vaccine. You may need a Td booster every 10 years. Zoster vaccine. You may need this after age 81. Pneumococcal 13-valent  conjugate (PCV13) vaccine. One dose is recommended after age 40. Pneumococcal polysaccharide (PPSV23) vaccine. One dose is recommended after age 37. Talk to your health care provider about which screenings and vaccines you need and how often you need them. This information is not intended to replace advice given to you by your health care provider. Make sure you discuss any questions you have with your health care provider. Document Released: 02/25/2015 Document Revised: 10/19/2015 Document Reviewed: 11/30/2014 Elsevier Interactive Patient Education  2017 [place] Prevention in the Home Falls can cause injuries. They can happen to people of all ages. There are many things you can do to make your home safe and to help prevent falls. What can I do on the outside of my home? Regularly fix the edges of walkways and driveways and fix any cracks. Remove anything that might make you trip as you walk through a door, such as a raised step or threshold. Trim any bushes or trees on the path to your home. Use bright outdoor lighting. Clear any walking paths of anything that might make someone trip, such as rocks or tools. Regularly check to see if handrails are loose or broken. Make sure that both sides of any steps have handrails. Any raised decks and porches should have guardrails on the edges. Have any leaves, snow, or ice cleared regularly. Use sand or salt on walking paths during winter. Clean up any spills in your garage right away. This includes oil or grease spills. What can I do in the bathroom? Use night lights. Install grab bars by the toilet and in the tub and shower. Do not use towel bars as grab bars. Use non-skid mats or decals in the tub or shower. If you need to sit down in the shower, use a plastic, non-slip stool. Keep the floor dry. Clean up any water that spills on the floor as soon as it happens. Remove soap buildup in the tub or shower regularly. Attach bath mats  securely with double-sided non-slip rug tape. Do not have throw rugs and other things on the floor that can make you trip. What can I do in the bedroom? Use night lights. Make sure that you have a light by your bed that is easy to reach. Do not use any sheets or blankets that are too big for your bed. They should not hang down onto the floor. Have a firm chair that has side arms. You can use this for support while you get dressed. Do not have throw rugs and other things on the floor that can make you trip. What can I do in the kitchen? Clean up any spills right away. Avoid walking on wet floors. Keep items that you use a lot in easy-to-reach places. If you need to reach something above you, use a strong step stool that has a grab bar. Keep electrical cords out of the way. Do not use floor polish or wax that makes floors slippery. If you must use wax, use non-skid floor wax. Do not have throw rugs and other things on the floor that can make you trip. What can I do with my stairs? Do not leave any items on the stairs. Make sure that there are handrails on both sides of the stairs and use them. Fix handrails that are broken or loose. Make sure that handrails are as long as the stairways. Check any carpeting to make sure that it is firmly attached to the stairs. Fix any carpet that is  loose or worn. Avoid having throw rugs at the top or bottom of the stairs. If you do have throw rugs, attach them to the floor with carpet tape. Make sure that you have a light switch at the top of the stairs and the bottom of the stairs. If you do not have them, ask someone to add them for you. What else can I do to help prevent falls? Wear shoes that: Do not have high heels. Have rubber bottoms. Are comfortable and fit you well. Are closed at the toe. Do not wear sandals. If you use a stepladder: Make sure that it is fully opened. Do not climb a closed stepladder. Make sure that both sides of the stepladder  are locked into place. Ask someone to hold it for you, if possible. Clearly mark and make sure that you can see: Any grab bars or handrails. First and last steps. Where the edge of each step is. Use tools that help you move around (mobility aids) if they are needed. These include: Canes. Walkers. Scooters. Crutches. Turn on the lights when you go into a dark area. Replace any light bulbs as soon as they burn out. Set up your furniture so you have a clear path. Avoid moving your furniture around. If any of your floors are uneven, fix them. If there are any pets around you, be aware of where they are. Review your medicines with your doctor. Some medicines can make you feel dizzy. This can increase your chance of falling. Ask your doctor what other things that you can do to help prevent falls. This information is not intended to replace advice given to you by your health care provider. Make sure you discuss any questions you have with your health care provider. Document Released: 11/25/2008 Document Revised: 07/07/2015 Document Reviewed: 03/05/2014 Elsevier Interactive Patient Education  2017 [employer].

## 2021-12-05 NOTE — Progress Notes (Signed)
Established Patient Office Visit  Subjective   Patient ID: Colleen Coffey, female    DOB: 1950/11/16  Age: 71 y.o. MRN: [id number]  Chief Complaint  Patient presents with   Follow-up    Patient states her blood pressure readings at home have ranged from 121/66-144/72 since taking 2 Amlodipine tablets    Patient reports her BP remains elevated but she feels like it is getting better. She has been taking 5 mg daily at bedtime of the amlodipine. She reports no side effects to the medication, no ankle swelling or SOB. We discussed increasing her dose and she is agreeable to this. Pt continues to check her BP daily at home.    Current Outpatient Medications  Medication Instructions   acetaminophen (TYLENOL) 650 mg, Oral, Every 6 hours PRN   amLODipine (NORVASC) 10 mg, Oral, Daily   celecoxib (CELEBREX) 200 mg, Oral, Daily   Cholecalciferol (VITAMIN D3 PO) 1,000 Units, Oral, Daily   Magnesium Hydroxide (DULCOLAX PO) Oral   meclizine (ANTIVERT) 6.25-12.5 mg, Oral, 3 times daily PRN   METAMUCIL FIBER PO 1 Dose, Oral, Daily   polyethylene glycol (MIRALAX) 17 g, Oral, Daily    Patient Active Problem List   Diagnosis Date Noted   Muscle pain 10/19/2021   Prediabetes 10/19/2021   Hypertension 04/27/2020   Osteoarthritis, knee 06/27/2015   BMI 28.0-28.9,adult 06/27/2015   Constipation 06/27/2015   Osteopenia 06/27/2015      Review of Systems  All other systems reviewed and are negative.     Objective:     BP (!) 148/70 (BP Location: Right Arm, Patient Position: Sitting, Cuff Size: Large)   Pulse 80   Temp 98.2 F (36.8 C) (Oral)   Ht 5' (1.524 m)   Wt 178 lb 4.8 oz (80.9 kg)   LMP 01/11/2017 (Exact Date)   SpO2 100%   BMI 34.82 kg/m    Physical Exam Vitals reviewed.  Constitutional:      Appearance: Normal appearance. She is well-groomed. She is obese.  Cardiovascular:     Rate and Rhythm: Normal rate and regular rhythm.     Heart sounds: S1 normal and S2  normal.  Pulmonary:     Effort: Pulmonary effort is normal.     Breath sounds: Normal breath sounds and air entry.  Musculoskeletal:     Right lower leg: No edema.     Left lower leg: No edema.  Neurological:     Mental Status: She is alert and oriented to person, place, and time. Mental status is at baseline.     Gait: Gait is intact.  Psychiatric:        Mood and Affect: Mood and affect normal.        Speech: Speech normal.        Behavior: Behavior normal.        Judgment: Judgment normal.      No results found for any visits on 12/05/21.    The 10-year ASCVD risk score (Arnett DK, et al., 2019) is: 17.4%    Assessment & Plan:   Problem List Items Addressed This Visit       Cardiovascular and Mediastinum   Hypertension - Primary    Is currently on 2 tablets (5 mg of the amlodipine) BP at home is better however today in office it is 152/70. Repeat BP is 148/70. Will increase her dose to 10 mg daily at bedtime and see her back in 2 months for repeat BP check. Once her  BP is under control then will space out her visits to every 6 months.       Relevant Medications   amLODipine (NORVASC) 10 MG tablet    Return in about 8 weeks (around 01/29/2022) for BP recheck.    Farrel Conners, MD

## 2021-12-05 NOTE — Progress Notes (Signed)
I connected with Colleen Coffey today by telephone and verified that I am speaking with the correct person using two identifiers. Location patient: home Location provider: work Persons participating in the virtual visit: Colleen Coffey, Colleen Durand LPN.   I discussed the limitations, risks, security and privacy concerns of performing an evaluation and management service by telephone and the availability of in person appointments. I also discussed with the patient that there may be a patient responsible charge related to this service. The patient expressed understanding and verbally consented to this telephonic visit.    Interactive audio and video telecommunications were attempted between this provider and patient, however failed, due to patient having technical difficulties OR patient did not have access to video capability.  We continued and completed visit with audio only.     Vital signs may be patient reported or missing.  Subjective:   Colleen Coffey is a 71 y.o. female who presents for Medicare Annual (Subsequent) preventive examination.  Review of Systems     Cardiac Risk Factors include: advanced age (>11mn, >>44women);hypertension;obesity (BMI >30kg/m2)     Objective:    Today's Vitals   12/05/21 1112  BP: (!) 148/70  Pulse: 80  Temp: 98.2 F (36.8 C)  TempSrc: Oral  Weight: 178 lb (80.7 kg)  Height: 5' (1.524 m)   Body mass index is 34.76 kg/m.     12/05/2021   11:15 AM 11/22/2020   11:08 AM 11/15/2020   11:41 PM 11/17/2019   11:02 AM 06/04/2017   10:57 AM  Advanced Directives  Does Patient Have a Medical Advance Directive? Yes Yes No Yes Yes  Type of AParamedicof ABelle MeadLiving will Living will  [place]ethle[place] Village[place] will [place]  Copy of [place] Chart? No - copy requested   No - copy requested   Would patient like information on creating a medical advance  directive?   No - Patient declined      Current Medications (verified) Outpatient Encounter Medications as of 12/05/2021  Medication Sig   acetaminophen (TYLENOL) 325 MG tablet Take 2 tablets (650 mg total) by mouth every 6 (six) hours as needed for mild pain (or temp > 100).   amLODipine (NORVASC) 10 MG tablet Take 1 tablet (10 mg total) by mouth daily.   celecoxib (CELEBREX) 200 MG capsule Take 1 capsule (200 mg total) by mouth daily.   Cholecalciferol (VITAMIN D3 PO) Take 1,000 Units by mouth daily.   Magnesium Hydroxide (DULCOLAX PO) Take by mouth.   meclizine (ANTIVERT) 12.5 MG tablet Take 0.5-1 tablets (6.25-12.5 mg total) by mouth 3 (three) times daily as needed for dizziness.   METAMUCIL FIBER PO Take 1 Dose by mouth daily.   polyethylene glycol (MIRALAX) 17 g packet Take 17 g by mouth daily.   No facility-administered encounter medications on file as of 12/05/2021.    Allergies (verified) Patient has no known allergies.   History: Past Medical History:  Diagnosis Date   Constipation    chronic, improved with healthy diet   Endocervical polyp    GERD (gastroesophageal reflux disease)    Hemorrhoids    resolved   Hypertension    Osteoarthritis, knee 06/27/2015   -saw [facility]    Osteopenia 06/27/2015   -reports on medication remotely with gyn    Past Surgical History:  Procedure Laterality Date   APPENDECTOMY     BACK SURGERY  2014   bulging disc  lumbar spine   BREAST CYST ASPIRATION Right    CESAREAN SECTION     2   CHOLECYSTECTOMY N/A 11/16/2020   Procedure: LAPAROSCOPIC CHOLECYSTECTOMY;  Surgeon: Jovita Kussmaul, MD;  Location: [facility];  Service: General;  Laterality: N/A;   Family History  Problem Relation Age of Onset   High blood pressure Mother    High Cholesterol Mother    Dementia Mother    Arthritis Mother    Heart disease Mother    Syncope episode Mother    High blood pressure Father    COPD Father    High blood pressure Sister     Parkinson's disease Sister    Arthritis Sister    High blood pressure Maternal Grandmother    High Cholesterol Maternal Grandmother    Dementia Maternal Grandmother    Arthritis Maternal Grandmother    COPD Brother    High blood pressure Brother    Hyperlipidemia Other    Hypertension Other    Depression Other    Esophageal cancer Neg Hx    Colon cancer Neg Hx    Pancreatic cancer Neg Hx    Stomach cancer Neg Hx    Liver disease Neg Hx    Breast cancer Neg Hx    Social History   Socioeconomic History   Marital status: Widowed    Spouse name: Not on file   Number of children: 2   Years of education: Not on file   Highest education level: Some college, no degree  Occupational History   Occupation: Retired  Tobacco Use   Smoking status: Never   Smokeless tobacco: Never  Vaping Use   Vaping Use: Never used  Substance and Sexual Activity   Alcohol use: No   Drug use: No   Sexual activity: Never    Birth control/protection: Post-menopausal  Other Topics Concern   Not on file  Social History Narrative   Work or School: retired, used to be a Librarian, academic for [employer]      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; as a Engineer, materials; husband and mother passed in 2013      Spiritual Beliefs: Christian      Lifestyle: exercising; eating healthy      Social Determinants of Health   Financial Resource Strain: [place]  (12/05/2021)   Overall Financial Resource Strain (CARDIA)    Difficulty of Paying Living Expenses: Not hard at all  Food Insecurity: No Food Insecurity (12/05/2021)   Hunger Vital Sign    Worried About Running Out of Food in the Last Year: Never true    [place] in the Last Year: Never true  Transportation Needs: No Transportation Needs (12/05/2021)   PRAPARE - Hydrologist (Medical): No    Lack of Transportation (Non-Medical): No  Physical Activity: Sufficiently Active (12/05/2021)   Exercise Vital Sign    Days of Exercise per Week:  3 days    Minutes of Exercise per Session: 90 min  Stress: No Stress Concern Present (12/05/2021)   [place]    Feeling of Stress : Only a little  Social Connections: Moderately Integrated (09/28/2021)   Social Connection and Isolation Panel [NHANES]    Frequency of Communication with Friends and Family: More than three times a week    Frequency of Social Gatherings with Friends and Family: Once a week    Attends Religious Services: More than 4 times per year  Active Member of Clubs or Organizations: Yes    Attends Archivist Meetings: More than 4 times per year    Marital Status: Widowed    Tobacco Counseling Counseling given: Not Answered   Clinical Intake:  Pre-visit preparation completed: Yes  Pain : No/denies pain     Nutritional Status: BMI > 30  Obese Nutritional Risks: None Diabetes: No  How often do you need to have someone help you when you read instructions, pamphlets, or other written materials from your doctor or pharmacy?: 1 - Never What is the last grade level you completed in school?: 27yrcollege  Diabetic? no  Interpreter Needed?: No  Information entered by :: NAllen LPN   Activities of Daily Living    12/05/2021   11:16 AM  In your present state of health, do you have any difficulty performing the following activities:  Hearing? 0  Vision? 0  Difficulty concentrating or making decisions? 0  Walking or climbing stairs? 1  Dressing or bathing? 0  Doing errands, shopping? 0  Preparing Food and eating ? N  Using the Toilet? N  In the past six months, have you accidently leaked urine? N  Do you have problems with loss of bowel control? N  Managing your Medications? N  Managing your Finances? N  Housekeeping or managing your Housekeeping? N    Patient Care Team: MFarrel Conners MD as PCP - General (Family Medicine)  Indicate any recent Medical Services you may  have received from other than [facility] providers in the past year (date may be approximate).     Assessment:   This is a routine wellness examination for JHaliimaile  Hearing/Vision screen Vision Screening - Comments:: Regular eye exams, [facility]  Dietary issues and exercise activities discussed: Current Exercise Habits: Home exercise routine, Type of exercise: strength training/weights;treadmill;Other - see comments (stationary bike), Time (Minutes): > 60, Frequency (Times/Week): 3, Weekly Exercise (Minutes/Week): 0   Goals Addressed             This Visit's Progress    Patient Stated       12/05/2021, wants to lose weight       Depression Screen    12/05/2021   11:16 AM 12/05/2021    9:13 AM 10/18/2021    9:22 AM 09/28/2021    1:27 PM 04/28/2021   10:00 AM 11/22/2020   11:07 AM 04/27/2020    9:51 AM  PHQ 2/9 Scores  PHQ - 2 Score 0 0 0 0 0 0 0  PHQ- 9 Score  0 '4 2 2      '$ Fall Risk    12/05/2021   11:16 AM 12/05/2021    9:13 AM 10/18/2021    9:22 AM 09/28/2021    1:26 PM 09/28/2021   12:06 PM  Fall Risk   Falls in the past year? '1 1 1  1  '$ Number falls in past yr: 0 0 '1 1 1  '$ Injury with Fall? 0 0 0 0 0  Risk for fall due to : Medication side effect No Fall Risks No Fall Risks Other (Comment)   Follow up Education provided;Falls prevention discussed;Falls evaluation completed Falls evaluation completed Falls evaluation completed Falls evaluation completed     FALL RISK PREVENTION PERTAINING TO THE HOME:  Any stairs in or around the home? Yes  If so, are there any without handrails? No  Home free of loose throw rugs in walkways, pet beds, electrical cords, etc? Yes  Adequate lighting  in your home to reduce risk of falls? Yes   ASSISTIVE DEVICES UTILIZED TO PREVENT FALLS:  Life alert? No  Use of a cane, walker or w/c? No  Grab bars in the bathroom? Yes  Shower chair or bench in shower? Yes  Elevated toilet seat or a handicapped toilet? Yes   TIMED UP AND  GO:  Was the test performed? No .       Cognitive Function:        12/05/2021   11:18 AM 11/22/2020   11:11 AM 11/17/2019   11:07 AM  6CIT Screen  What Year? 0 points 0 points 0 points  What month? 0 points 0 points 0 points  What time? 0 points 0 points   Count back from 20 0 points 0 points 0 points  Months in reverse 0 points 0 points 0 points  Repeat phrase 2 points 0 points 2 points  Total Score 2 points 0 points     Immunizations Immunization History  Administered Date(s) Administered   Fluad Quad(high Dose 65+) 10/16/2018, 10/20/2021   Influenza Split 11/15/2010, 11/08/2011   Influenza Whole 11/26/2006, 11/25/2007, 11/17/2008, 11/02/2009   Influenza, High Dose Seasonal PF 10/21/2014, 10/27/2015, 10/25/2016, 10/10/2017   Influenza-Unspecified 10/13/2013, 10/29/2019, 10/13/2020   PFIZER(Purple Top)SARS-COV-2 Vaccination 03/21/2019, 04/11/2019, 11/13/2019, 10/20/2020   PNEUMOCOCCAL CONJUGATE-20 04/28/2021   Pfizer Covid-19 Vaccine Bivalent Booster 38yr & up 11/09/2021   Pneumococcal Conjugate-13 12/16/2014   Pneumococcal Polysaccharide-23 10/04/2016   Respiratory Syncytial Virus Vaccine,Recomb Aduvanted(Arexvy) 11/16/2021   Td 02/12/1998, 01/04/2009, 09/25/2019   Zoster Recombinat (Shingrix) 06/23/2019, 09/02/2019   Zoster, Live 02/19/2014    TDAP status: Up to date  Flu Vaccine status: Up to date  Pneumococcal vaccine status: Up to date  Covid-19 vaccine status: Completed vaccines  Qualifies for Shingles Vaccine? Yes   Zostavax completed Yes   Shingrix Completed?: Yes  Screening Tests Health Maintenance  Topic Date Due   COVID-19 Vaccine (6 - Pfizer series) 03/11/2022   MAMMOGRAM  02/11/2023   COLONOSCOPY (Pts 45-414yrInsurance coverage will need to be confirmed)  08/09/2029   TETANUS/TDAP  09/24/2029   Pneumonia Vaccine 6558Years old  Completed   INFLUENZA VACCINE  Completed   DEXA SCAN  Completed   Hepatitis C Screening  Completed   Zoster  Vaccines- Shingrix  Completed   HPV VACCINES  Aged Out   Fecal DNA (Cologuard)  Discontinued    Health Maintenance  There are no preventive care reminders to display for this patient.  Colorectal cancer screening: Type of screening: Colonoscopy. Completed 08/10/2019. Repeat every 10 years  Mammogram status: scheduled for 02/13/2022  Bone Density status: Completed 02/03/2018.   Lung Cancer Screening: (Low Dose CT Chest recommended if Age 72-80ears, 30 pack-year currently smoking OR have quit w/in 15years.) does not qualify.   Lung Cancer Screening Referral: no  Additional Screening:  Hepatitis C Screening: does qualify; Completed 10/04/2016  Vision Screening: Recommended annual ophthalmology exams for early detection of glaucoma and other disorders of the eye. Is the patient up to date with their annual eye exam?  Yes  Who is the provider or what is the name of the office in which the patient attends annual eye exams? [facility] If pt is not established with a provider, would they like to be referred to a provider to establish care? No .   Dental Screening: Recommended annual dental exams for proper oral hygiene  Community Resource Referral / Chronic Care Management: CRR required this visit?  No   CCM required this visit?  No      Plan:     I have personally reviewed and noted the following in the patient's chart:   Medical and social history Use of alcohol, tobacco or illicit drugs  Current medications and supplements including opioid prescriptions. Patient is not currently taking opioid prescriptions. Functional ability and status Nutritional status Physical activity Advanced directives List of other physicians Hospitalizations, surgeries, and ER visits in previous 12 months Vitals Screenings to include cognitive, depression, and falls Referrals and appointments  In addition, I have reviewed and discussed with patient certain preventive protocols, quality  metrics, and best practice recommendations. A written personalized care plan for preventive services as well as general preventive health recommendations were provided to patient.     Kellie Simmering, LPN   [id number]   Nurse Notes: none

## 2021-12-13 ENCOUNTER — Telehealth: Payer: Self-pay | Admitting: Family Medicine

## 2021-12-13 DIAGNOSIS — M25551 Pain in right hip: Secondary | ICD-10-CM

## 2021-12-13 NOTE — Telephone Encounter (Signed)
Spoke with the patient for more information.  Patient stated she would like to try PT (per hip x-ray results from 10/2) and does not have a preference for the location.  Patient is aware Dr Legrand Como will enter the referral and someone will contact her with appt information.  Message sent to PCP.

## 2021-12-13 NOTE — Telephone Encounter (Signed)
Pt called for a call back regarding a referral, as per a discussion during last OV.

## 2021-12-14 NOTE — Telephone Encounter (Signed)
Patient aware.

## 2021-12-21 DIAGNOSIS — Z0100 Encounter for examination of eyes and vision without abnormal findings: Secondary | ICD-10-CM | POA: Diagnosis not present

## 2021-12-21 DIAGNOSIS — H524 Presbyopia: Secondary | ICD-10-CM | POA: Diagnosis not present

## 2021-12-27 ENCOUNTER — Ambulatory Visit
Payer: No Typology Code available for payment source | Attending: Family Medicine | Admitting: Rehabilitative and Restorative Service Providers"

## 2021-12-27 ENCOUNTER — Encounter: Payer: Self-pay | Admitting: Rehabilitative and Restorative Service Providers"

## 2021-12-27 ENCOUNTER — Other Ambulatory Visit: Payer: Self-pay

## 2021-12-27 DIAGNOSIS — M5459 Other low back pain: Secondary | ICD-10-CM

## 2021-12-27 DIAGNOSIS — M25552 Pain in left hip: Secondary | ICD-10-CM | POA: Diagnosis not present

## 2021-12-27 DIAGNOSIS — M6281 Muscle weakness (generalized): Secondary | ICD-10-CM

## 2021-12-27 DIAGNOSIS — M25551 Pain in right hip: Secondary | ICD-10-CM | POA: Insufficient documentation

## 2021-12-27 DIAGNOSIS — R2689 Other abnormalities of gait and mobility: Secondary | ICD-10-CM

## 2021-12-27 DIAGNOSIS — R262 Difficulty in walking, not elsewhere classified: Secondary | ICD-10-CM

## 2021-12-27 NOTE — Therapy (Signed)
OUTPATIENT PHYSICAL THERAPY LOWER EXTREMITY EVALUATION   Patient Name: Colleen Coffey MRN: [id number] DOB:[birth year], 71 y.o., female Today's Date: 12/27/2021   PT End of Session - 12/27/21 1020     Visit Number 1    Date for PT Re-Evaluation 02/21/22    Authorization Type [employer]    Progress Note Due on Visit 10    PT Start Time 1015    PT Stop Time 1055    PT Time Calculation (min) 40 min    Activity Tolerance Patient tolerated treatment well    Behavior During Therapy WFL for tasks assessed/performed             Past Medical History:  Diagnosis Date   Constipation    chronic, improved with healthy diet   Endocervical polyp    GERD (gastroesophageal reflux disease)    Hemorrhoids    resolved   Hypertension    Osteoarthritis, knee 06/27/2015   -saw [facility]    Osteopenia 06/27/2015   -reports on medication remotely with gyn    Past Surgical History:  Procedure Laterality Date   APPENDECTOMY     BACK SURGERY  2014   bulging disc lumbar spine   BREAST CYST ASPIRATION Right    CESAREAN SECTION     2   CHOLECYSTECTOMY N/A 11/16/2020   Procedure: LAPAROSCOPIC CHOLECYSTECTOMY;  Surgeon: Jovita Kussmaul, MD;  Location: [facility];  Service: General;  Laterality: N/A;   Patient Active Problem List   Diagnosis Date Noted   Muscle pain 10/19/2021   Prediabetes 10/19/2021   Hypertension 04/27/2020   Osteoarthritis, knee 06/27/2015   BMI 28.0-28.9,adult 06/27/2015   Constipation 06/27/2015   Osteopenia 06/27/2015    PCP: Farrel Conners, MD  REFERRING PROVIDER: Farrel Conners, MD  REFERRING DIAG: [phone] (ICD-10-CM) - Bilateral hip pain  THERAPY DIAG:  Other low back pain - Plan: PT plan of care cert/re-cert  Difficulty in walking, not elsewhere classified - Plan: PT plan of care cert/re-cert  Muscle weakness (generalized) - Plan: PT plan of care cert/re-cert  Other abnormalities of gait and mobility - Plan: PT plan of care  cert/re-cert  Bilateral hip pain - Plan: PT plan of care cert/re-cert  Rationale for Evaluation and Treatment: Rehabilitation  ONSET DATE: September 2023  SUBJECTIVE:   SUBJECTIVE STATEMENT: Pt reports that she has been having myalgias since being placed on cholesterol medication, so Dr Legrand Como discontinued the medication and pt did improve.  However, starting around September, she noted having increased back and hip pain that has not improved.  PERTINENT HISTORY: HTN, OA, Osteopenia PAIN:  Are you having pain? Yes: NPRS scale: 2-8/10 Pain location: back and hips Pain description: radiating and throbbing Aggravating factors: standing, walking, stairs Relieving factors: sitting  PRECAUTIONS: None  WEIGHT BEARING RESTRICTIONS: No  FALLS:  Has patient fallen in last 6 months? No  LIVING ENVIRONMENT: Lives with: lives with their spouse Lives in: House/apartment Stairs: Yes: Internal: 14 steps; bilateral but cannot reach both and External: 1 steps; on right going up Has following equipment at home: None  OCCUPATION: Retired, formerly was a Librarian, academic at [employer]  PLOF: Independent and Leisure: watching tv, going to the gym, visiting family, traveling  PATIENT GOALS: To be able to exercise and do desired activities without pain  NEXT MD VISIT: 01/29/2022 with Dr Legrand Como  OBJECTIVE:   DIAGNOSTIC FINDINGS:  11/13/21 Bilat hip radiograph: IMPRESSION: 1. No acute findings. No significant degenerative change at either hip joint. 2. Mild  degenerative sclerosis at the symphysis pubis.  PATIENT SURVEYS:  Eval:  FOTO 33% (projected 57% by visit 16)  COGNITION: Overall cognitive status: Within functional limits for tasks assessed     SENSATION: Reports sometimes feeling numbness in her legs   MUSCLE LENGTH: Hamstrings: tightness noted bilaterally  POSTURE: rounded shoulders and forward head  PALPATION: Tenderness to palpation at bilateral hips, spasms noted along lumbar  multifidi   LOWER EXTREMITY ROM:  WFL  LOWER EXTREMITY MMT:  12/27/2021:  BLE strength grossly 4/5 throughout  LOWER EXTREMITY SPECIAL TESTS:  Hip special tests: SI compression test: positive   FUNCTIONAL TESTS:  5 times sit to stand: 21.3 seconds  GAIT: Distance walked: >100 ft Assistive device utilized: None Level of assistance: Complete Independence Comments: Pt with antalgic gait noted   TODAY'S TREATMENT:                                                                                                                              DATE: 12/27/2021 Reviewed HEP (see below for 1 set of each)  Nustep Level 5 x5 min with PT present to discuss status   PATIENT EDUCATION:  Education details: Issued HEP Person educated: Patient Education method: Explanation, Media planner, and Handouts Education comprehension: verbalized understanding and returned demonstration  HOME EXERCISE PROGRAM: Access Code: [email or web address] URL: [email or web address] Date: 12/27/2021 Prepared by: Shelby Dubin Shaaron Golliday  Exercises - Seated Piriformis Stretch  - 1 x daily - 7 x weekly - 1 sets - 2 reps - 20 sec hold - Seated Hamstring Stretch  - 1 x daily - 7 x weekly - 1 sets - 2 reps - 20 sec hold - Seated Long Arc Quad  - 1 x daily - 7 x weekly - 2 sets - 10 reps - Seated March  - 1 x daily - 7 x weekly - 2 sets - 10 reps - Seated Hip Adduction Isometrics with Ball  - 1 x daily - 7 x weekly - 2 sets - 10 reps - Supine Posterior Pelvic Tilt  - 1 x daily - 7 x weekly - 2 sets - 10 reps - Bent Knee Fallouts  - 1 x daily - 7 x weekly - 2 sets - 10 reps  ASSESSMENT:  CLINICAL IMPRESSION: Patient is a 71 y.o. female who was seen today for physical therapy evaluation and treatment for bilateral hip pain. Pts PLOF is independent with ability to go exercise in the gym without increased pain.  Since September, pt has been having increased pain in her hips and low back.  Pt had a lumbar surgery in 2014 with  good results following surgical procedure.  Pt presents with muscle weakness, pain with ambulation, decreased balance and hamstring tightness.  Pt would benefit from skilled PT to progress towards goal related activities.  OBJECTIVE IMPAIRMENTS: decreased balance, difficulty walking, decreased strength, increased muscle spasms, impaired flexibility, postural dysfunction, and pain.   ACTIVITY LIMITATIONS: standing, squatting, and transfers  PARTICIPATION  LIMITATIONS: cleaning and community activity  PERSONAL FACTORS: Time since onset of injury/illness/exacerbation and 3+ comorbidities: OA, osteopenia, Hx of lumbar surgery  are also affecting patient's functional outcome.   REHAB POTENTIAL: Good  CLINICAL DECISION MAKING: Evolving/moderate complexity  EVALUATION COMPLEXITY: Moderate   GOALS: Goals reviewed with patient? No  SHORT TERM GOALS: Target date: 01/17/2022  Pt will be independent with initial HEP. Baseline: Goal status: INITIAL  2.  Pt will improve 5 times sit to/from stand to no greater than 15 seconds or less to demonstrate improved functional strength. Baseline:  Goal status: INITIAL   LONG TERM GOALS: Target date: 02/21/2022   Pt will be independent with advanced HEP. Baseline:  Goal status: INITIAL  2.  Pt will increase BLE strength to at least 4+/5 to allow him to improve her ability to navigate stairs. Baseline: 4/5 Goal status: INITIAL  3.  Pt will report ability to ambulate for at least 20 minutes without increased pain. Baseline:  Goal status: INITIAL  4.  Pt will report ability to return to working out in the gym without increased pain. Baseline:  Goal status: INITIAL  5.  Pt will increase FOTO to at least 57% to demonstrate improvements in functional mobility. Baseline: 33% Goal status: INITIAL     PLAN:  PT FREQUENCY: 2x/week  PT DURATION: 8 weeks  PLANNED INTERVENTIONS: Therapeutic exercises, Therapeutic activity, Neuromuscular  re-education, Balance training, Gait training, Patient/Family education, Self Care, Joint mobilization, Joint manipulation, Stair training, Vestibular training, Canalith repositioning, Aquatic Therapy, Dry Needling, Electrical stimulation, Spinal manipulation, Spinal mobilization, Cryotherapy, Moist heat, Taping, Traction, Ultrasound, Ionotophoresis '4mg'$ /ml Dexamethasone, Manual therapy, and Re-evaluation  PLAN FOR NEXT SESSION: Assess and progress HEP as indicated, strengthening, core strengthening, flexibility   Juel Burrow, PT 12/27/2021, 11:09 AM   [facility], [place], [place] [address] Phone # [phone] Fax [phone]

## 2021-12-28 DIAGNOSIS — R2681 Unsteadiness on feet: Secondary | ICD-10-CM | POA: Diagnosis not present

## 2021-12-28 DIAGNOSIS — Z008 Encounter for other general examination: Secondary | ICD-10-CM | POA: Diagnosis not present

## 2021-12-28 DIAGNOSIS — E669 Obesity, unspecified: Secondary | ICD-10-CM | POA: Diagnosis not present

## 2021-12-28 DIAGNOSIS — I700 Atherosclerosis of aorta: Secondary | ICD-10-CM | POA: Diagnosis not present

## 2021-12-28 DIAGNOSIS — E785 Hyperlipidemia, unspecified: Secondary | ICD-10-CM | POA: Diagnosis not present

## 2021-12-28 DIAGNOSIS — I10 Essential (primary) hypertension: Secondary | ICD-10-CM | POA: Diagnosis not present

## 2021-12-28 DIAGNOSIS — Z6834 Body mass index (BMI) 34.0-34.9, adult: Secondary | ICD-10-CM | POA: Diagnosis not present

## 2021-12-28 DIAGNOSIS — R7303 Prediabetes: Secondary | ICD-10-CM | POA: Diagnosis not present

## 2021-12-31 NOTE — Therapy (Unsigned)
OUTPATIENT PHYSICAL THERAPY TREATMENT NOTE   Patient Name: Colleen Coffey MRN: [id number] DOB:Jul 04, 1950, 70 y.o., female Today's Date: 01/01/2022  PCP: Farrel Conners, MD  REFERRING PROVIDER: Farrel Conners, MD   END OF SESSION:   PT End of Session - 01/01/22 1445     Visit Number 2    Date for PT Re-Evaluation 02/21/22    Authorization Type [employer]    Progress Note Due on Visit 10    PT Start Time 1445    PT Stop Time 1523    PT Time Calculation (min) 38 min    Activity Tolerance Patient tolerated treatment well    Behavior During Therapy [facility] for tasks assessed/performed             Past Medical History:  Diagnosis Date   Constipation    chronic, improved with healthy diet   Endocervical polyp    GERD (gastroesophageal reflux disease)    Hemorrhoids    resolved   Hypertension    Osteoarthritis, knee 06/27/2015   -[facility]    Osteopenia 06/27/2015   -reports on medication remotely with gyn    Past Surgical History:  Procedure Laterality Date   APPENDECTOMY     BACK SURGERY  2014   bulging disc lumbar spine   BREAST CYST ASPIRATION Right    CESAREAN SECTION     2   CHOLECYSTECTOMY N/A 11/16/2020   Procedure: LAPAROSCOPIC CHOLECYSTECTOMY;  Surgeon: Jovita Kussmaul, MD;  Location: [facility];  Service: General;  Laterality: N/A;   Patient Active Problem List   Diagnosis Date Noted   Muscle pain 10/19/2021   Prediabetes 10/19/2021   Hypertension 04/27/2020   Osteoarthritis, knee 06/27/2015   BMI 28.0-28.9,adult 06/27/2015   Constipation 06/27/2015   Osteopenia 06/27/2015    REFERRING DIAG: M25.551,M25.552 (ICD-10-CM) - Bilateral hip pain   THERAPY DIAG:  Other low back pain  Difficulty in walking, not elsewhere classified  Other abnormalities of gait and mobility  Muscle weakness (generalized)  Bilateral hip pain  Rationale for Evaluation and Treatment Rehabilitation  PERTINENT HISTORY: HTN, OA, Osteopenia    PRECAUTIONS: None  SUBJECTIVE:                                                                                                                                                                                      SUBJECTIVE STATEMENT:  HEP going ok, doing 1-2 x day. Pain about the same.   PAIN:  Are you having pain? Yes: NPRS scale: 8/10 Pain location: Bil hips into thighs a bit Pain description: Throb Aggravating factors: When walking Relieving factors: Sitting/laying down   OBJECTIVE: (objective  measures completed at initial evaluation unless otherwise dated)   DIAGNOSTIC FINDINGS:  11/13/21 Bilat hip radiograph: IMPRESSION: 1. No acute findings. No significant degenerative change at either hip joint. 2. Mild degenerative sclerosis at the symphysis pubis.   PATIENT SURVEYS:  Eval:  FOTO 33% (projected 57% by visit 16)   COGNITION: Overall cognitive status: Within functional limits for tasks assessed                         SENSATION: Reports sometimes feeling numbness in her legs     MUSCLE LENGTH: Hamstrings: tightness noted bilaterally   POSTURE: rounded shoulders and forward head   PALPATION: Tenderness to palpation at bilateral hips, spasms noted along lumbar multifidi    LOWER EXTREMITY ROM:   WFL   LOWER EXTREMITY MMT:   12/27/2021:  BLE strength grossly 4/5 throughout   LOWER EXTREMITY SPECIAL TESTS:  Hip special tests: SI compression test: positive    FUNCTIONAL TESTS:  5 times sit to stand: 21.3 seconds   GAIT: Distance walked: >100 ft Assistive device utilized: None Level of assistance: Complete Independence Comments: Pt with antalgic gait noted     TODAY'S TREATMENT:   01/01/22: Review and performance of initial HEP; minor Vc for technique and posture Self care: posture and avoiding seated compresison (slouching) Supine piriformis stretch and hip Ext rot AROM.  Nustep (new model) L4 x 6 min      Trial of Addaday to Bil proximal  quads. Did not tolerate much more than 1 min on each.                                                                                                                          DATE: 12/27/2021 Reviewed HEP (see below for 1 set of each)  Nustep Level 5 x5 min with PT present to discuss status     PATIENT EDUCATION:  Education details: Issued HEP Person educated: Patient Education method: Explanation, Demonstration, and Handouts Education comprehension: verbalized understanding and returned demonstration   HOME EXERCISE PROGRAM: Access Code: [email or web address] URL: [email or web address] Date: 12/27/2021 Prepared by: Shelby Dubin Menke   Exercises - Seated Piriformis Stretch  - 1 x daily - 7 x weekly - 1 sets - 2 reps - 20 sec hold - Seated Hamstring Stretch  - 1 x daily - 7 x weekly - 1 sets - 2 reps - 20 sec hold - Seated Long Arc Quad  - 1 x daily - 7 x weekly - 2 sets - 10 reps - Seated March  - 1 x daily - 7 x weekly - 2 sets - 10 reps - Seated Hip Adduction Isometrics with Ball  - 1 x daily - 7 x weekly - 2 sets - 10 reps - Supine Posterior Pelvic Tilt  - 1 x daily - 7 x weekly - 2 sets - 10 reps - Bent Knee Fallouts  - 1 x daily - 7 x weekly - 2 sets -  10 reps   ASSESSMENT:   CLINICAL IMPRESSION: Pt arrives rating her hip pain at an 8/10 with no facial grimacing or cautious movements. Pt is compliant with initial HEP but required cuing for posture and core engagement. No pain with exercises, pt downgraded her throbbing to more of an ache at the end of todays session.    OBJECTIVE IMPAIRMENTS: decreased balance, difficulty walking, decreased strength, increased muscle spasms, impaired flexibility, postural dysfunction, and pain.    ACTIVITY LIMITATIONS: standing, squatting, and transfers   PARTICIPATION LIMITATIONS: cleaning and community activity   PERSONAL FACTORS: Time since onset of injury/illness/exacerbation and 3+ comorbidities: OA, osteopenia, Hx of lumbar surgery   are also affecting patient's functional outcome.    REHAB POTENTIAL: Good   CLINICAL DECISION MAKING: Evolving/moderate complexity   EVALUATION COMPLEXITY: Moderate     GOALS: Goals reviewed with patient? No   SHORT TERM GOALS: Target date: 01/17/2022  Pt will be independent with initial HEP. Baseline: Goal status: INITIAL   2.  Pt will improve 5 times sit to/from stand to no greater than 15 seconds or less to demonstrate improved functional strength. Baseline:  Goal status: INITIAL     LONG TERM GOALS: Target date: 02/21/2022    Pt will be independent with advanced HEP. Baseline:  Goal status: INITIAL   2.  Pt will increase BLE strength to at least 4+/5 to allow him to improve her ability to navigate stairs. Baseline: 4/5 Goal status: INITIAL   3.  Pt will report ability to ambulate for at least 20 minutes without increased pain. Baseline:  Goal status: INITIAL   4.  Pt will report ability to return to working out in the gym without increased pain. Baseline:  Goal status: INITIAL   5.  Pt will increase FOTO to at least 57% to demonstrate improvements in functional mobility. Baseline: 33% Goal status: INITIAL         PLAN:   PT FREQUENCY: 2x/week   PT DURATION: 8 weeks   PLANNED INTERVENTIONS: Therapeutic exercises, Therapeutic activity, Neuromuscular re-education, Balance training, Gait training, Patient/Family education, Self Care, Joint mobilization, Joint manipulation, Stair training, Vestibular training, Canalith repositioning, Aquatic Therapy, Dry Needling, Electrical stimulation, Spinal manipulation, Spinal mobilization, Cryotherapy, Moist heat, Taping, Traction, Ultrasound, Ionotophoresis '4mg'$ /ml Dexamethasone, Manual therapy, and Re-evaluation   PLAN FOR NEXT SESSION: Assess and progress HEP as indicated, strengthening, core strengthening, flexibility     Tamala Manzer, PTA 01/01/2022, 3:21 PM

## 2022-01-01 ENCOUNTER — Ambulatory Visit: Payer: No Typology Code available for payment source | Admitting: Physical Therapy

## 2022-01-01 ENCOUNTER — Encounter: Payer: Self-pay | Admitting: Family Medicine

## 2022-01-01 ENCOUNTER — Encounter: Payer: Self-pay | Admitting: Physical Therapy

## 2022-01-01 DIAGNOSIS — M6281 Muscle weakness (generalized): Secondary | ICD-10-CM

## 2022-01-01 DIAGNOSIS — R262 Difficulty in walking, not elsewhere classified: Secondary | ICD-10-CM

## 2022-01-01 DIAGNOSIS — M25551 Pain in right hip: Secondary | ICD-10-CM | POA: Diagnosis not present

## 2022-01-01 DIAGNOSIS — M5459 Other low back pain: Secondary | ICD-10-CM

## 2022-01-01 DIAGNOSIS — R2689 Other abnormalities of gait and mobility: Secondary | ICD-10-CM

## 2022-01-01 DIAGNOSIS — M170 Bilateral primary osteoarthritis of knee: Secondary | ICD-10-CM

## 2022-01-02 MED ORDER — DICLOFENAC SODIUM 75 MG PO TBEC: 75.0000 mg | DELAYED_RELEASE_TABLET | Freq: Two times a day (BID) | ORAL | 0 refills | Status: DC

## 2022-01-12 ENCOUNTER — Encounter: Payer: Self-pay | Admitting: Physical Therapy

## 2022-01-12 ENCOUNTER — Ambulatory Visit: Payer: No Typology Code available for payment source | Attending: Family Medicine | Admitting: Physical Therapy

## 2022-01-12 DIAGNOSIS — R293 Abnormal posture: Secondary | ICD-10-CM | POA: Diagnosis not present

## 2022-01-12 DIAGNOSIS — R262 Difficulty in walking, not elsewhere classified: Secondary | ICD-10-CM | POA: Insufficient documentation

## 2022-01-12 DIAGNOSIS — R252 Cramp and spasm: Secondary | ICD-10-CM | POA: Insufficient documentation

## 2022-01-12 DIAGNOSIS — M6281 Muscle weakness (generalized): Secondary | ICD-10-CM | POA: Diagnosis not present

## 2022-01-12 DIAGNOSIS — M5459 Other low back pain: Secondary | ICD-10-CM | POA: Insufficient documentation

## 2022-01-12 DIAGNOSIS — R2689 Other abnormalities of gait and mobility: Secondary | ICD-10-CM | POA: Insufficient documentation

## 2022-01-12 DIAGNOSIS — M25551 Pain in right hip: Secondary | ICD-10-CM | POA: Diagnosis not present

## 2022-01-12 DIAGNOSIS — M25552 Pain in left hip: Secondary | ICD-10-CM | POA: Insufficient documentation

## 2022-01-12 NOTE — Therapy (Signed)
OUTPATIENT PHYSICAL THERAPY TREATMENT NOTE   Patient Name: Colleen Coffey MRN: [id number] DOB:1950/10/26, 71 y.o., female Today's Date: 01/12/2022  PCP: Farrel Conners, MD  REFERRING PROVIDER: Farrel Conners, MD   END OF SESSION:   PT End of Session - 01/12/22 1056     Visit Number 3    Date for PT Re-Evaluation 02/21/22    Authorization Type [employer]    Progress Note Due on Visit 10    PT Start Time 1058    PT Stop Time 1130    PT Time Calculation (min) 32 min    Activity Tolerance Patient tolerated treatment well    Behavior During Therapy [facility] for tasks assessed/performed              Past Medical History:  Diagnosis Date   Constipation    chronic, improved with healthy diet   Endocervical polyp    GERD (gastroesophageal reflux disease)    Hemorrhoids    resolved   Hypertension    Osteoarthritis, knee 06/27/2015   -[facility]    Osteopenia 06/27/2015   -reports on medication remotely with gyn    Past Surgical History:  Procedure Laterality Date   APPENDECTOMY     BACK SURGERY  2014   bulging disc lumbar spine   BREAST CYST ASPIRATION Right    CESAREAN SECTION     2   CHOLECYSTECTOMY N/A 11/16/2020   Procedure: LAPAROSCOPIC CHOLECYSTECTOMY;  Surgeon: Jovita Kussmaul, MD;  Location: [facility];  Service: General;  Laterality: N/A;   Patient Active Problem List   Diagnosis Date Noted   Muscle pain 10/19/2021   Prediabetes 10/19/2021   Hypertension 04/27/2020   Osteoarthritis, knee 06/27/2015   BMI 28.0-28.9,adult 06/27/2015   Constipation 06/27/2015   Osteopenia 06/27/2015    REFERRING DIAG: M25.551,M25.552 (ICD-10-CM) - Bilateral hip pain   THERAPY DIAG:  Other low back pain  Difficulty in walking, not elsewhere classified  Other abnormalities of gait and mobility  Muscle weakness (generalized)  Bilateral hip pain  Rationale for Evaluation and Treatment Rehabilitation  PERTINENT HISTORY: HTN, OA, Osteopenia    PRECAUTIONS: None  SUBJECTIVE:                                                                                                                                                                                      SUBJECTIVE STATEMENT: The doctor changed my meds and that has helped.  PAIN:  Are you having pain? Yes: NPRS scale: 5/10 Pain location: Bil hips into thighs a bit Pain description: Throb Aggravating factors: When walking Relieving factors: Sitting/laying down   OBJECTIVE: (objective measures completed at  initial evaluation unless otherwise dated)   DIAGNOSTIC FINDINGS:  11/13/21 Bilat hip radiograph: IMPRESSION: 1. No acute findings. No significant degenerative change at either hip joint. 2. Mild degenerative sclerosis at the symphysis pubis.   PATIENT SURVEYS:  Eval:  FOTO 33% (projected 57% by visit 16)   COGNITION: Overall cognitive status: Within functional limits for tasks assessed                         SENSATION: Reports sometimes feeling numbness in her legs     MUSCLE LENGTH: Hamstrings: tightness noted bilaterally   POSTURE: rounded shoulders and forward head   PALPATION: Tenderness to palpation at bilateral hips, spasms noted along lumbar multifidi    LOWER EXTREMITY ROM:   WFL   LOWER EXTREMITY MMT:   12/27/2021:  BLE strength grossly 4/5 throughout   LOWER EXTREMITY SPECIAL TESTS:  Hip special tests: SI compression test: positive    FUNCTIONAL TESTS:  5 times sit to stand: 21.3 seconds   GAIT: Distance walked: >100 ft Assistive device utilized: None Level of assistance: Complete Independence Comments: Pt with antalgic gait noted     TODAY'S TREATMENT:   01/12/22: Nustep old model L2 7 min wit hPTA present to discuss current status LAQ Bil 1.5# 15x  Seated clamshells blue loop 15x Seated marching with 1.5# 15x Sit to stand from low mat: 10x  Supine hamstring with strap 4 breaths 2x with strap LE mobilization medial/lateral  with strap 10x Bil VC to relax leg in strap as she was trying to hold the leg up with her hip flexors and they kept cramping  01/01/22: Review and performance of initial HEP; minor Vc for technique and posture Self care: posture and avoiding seated compresison (slouching) Supine piriformis stretch and hip Ext rot AROM.  Nustep (new model) L4 x 6 min      Trial of Addaday to Bil proximal quads. Did not tolerate much more than 1 min on each.                                                                                                                          DATE: 12/27/2021 Reviewed HEP (see below for 1 set of each)  Nustep Level 5 x5 min with PT present to discuss status     PATIENT EDUCATION:  Education details: Issued HEP Person educated: Patient Education method: Explanation, Demonstration, and Handouts Education comprehension: verbalized understanding and returned demonstration   HOME EXERCISE PROGRAM: Access Code: [email or web address] URL: [email or web address] Date: 12/27/2021 Prepared by: Shelby Dubin Menke   Exercises - Seated Piriformis Stretch  - 1 x daily - 7 x weekly - 1 sets - 2 reps - 20 sec hold - Seated Hamstring Stretch  - 1 x daily - 7 x weekly - 1 sets - 2 reps - 20 sec hold - Seated Long Arc Quad  - 1 x daily - 7 x weekly - 2 sets - 10 reps - Seated  March  - 1 x daily - 7 x weekly - 2 sets - 10 reps - Seated Hip Adduction Isometrics with Ball  - 1 x daily - 7 x weekly - 2 sets - 10 reps - Supine Posterior Pelvic Tilt  - 1 x daily - 7 x weekly - 2 sets - 10 reps - Bent Knee Fallouts  - 1 x daily - 7 x weekly - 2 sets - 10 reps   ASSESSMENT:   CLINICAL IMPRESSION: Pt reports her MD put her on a new pain medication that has helped out a lot.  Added some light resistance for LE strength, pt did fine with this although there was initially some reluctance. Pt had a difficult time relaxing her legs in the strap for stretching and kept cramping in her hip flexors.  Discussed returning to the gym to do the Nustep instead of the treadmill.   OBJECTIVE IMPAIRMENTS: decreased balance, difficulty walking, decreased strength, increased muscle spasms, impaired flexibility, postural dysfunction, and pain.    ACTIVITY LIMITATIONS: standing, squatting, and transfers   PARTICIPATION LIMITATIONS: cleaning and community activity   PERSONAL FACTORS: Time since onset of injury/illness/exacerbation and 3+ comorbidities: OA, osteopenia, Hx of lumbar surgery  are also affecting patient's functional outcome.    REHAB POTENTIAL: Good   CLINICAL DECISION MAKING: Evolving/moderate complexity   EVALUATION COMPLEXITY: Moderate     GOALS: Goals reviewed with patient? No   SHORT TERM GOALS: Target date: 01/17/2022  Pt will be independent with initial HEP. Baseline: Goal status: Goal met:    01/12/22  2.  Pt will improve 5 times sit to//from stand to no greater than 15 seconds /or less to demonstrate improved functional strength. Baseline:  Goal status: INITIAL     LONG TERM GOALS: Target date: 02/21/2022    Pt will be independent with advanced HEP. Baseline:  Goal status: INITIAL   2.  Pt will increase BLE strength to at least 4+/5 to allow him to improve her ability to navigate stairs. Baseline: 4/5 Goal status: INITIAL   3.  Pt will report ability to ambulate for at least 20 minutes without increased pain. Baseline:  Goal status: INITIAL   4.  Pt will report ability to return to working out in the gym without increased pain. Baseline:  Goal status: INITIAL   5.  Pt will increase FOTO to at least 57% to demonstrate improvements in functional mobility. Baseline: 33% Goal status: INITIAL         PLAN:   PT FREQUENCY: 2x/week   PT DURATION: 8 weeks   PLANNED INTERVENTIONS: Therapeutic exercises, Therapeutic activity, Neuromuscular re-education, Balance training, Gait training, Patient/Family education, Self Care, Joint mobilization, Joint  manipulation, Stair training, Vestibular training, Canalith repositioning, Aquatic Therapy, Dry Needling, Electrical stimulation, Spinal manipulation, Spinal mobilization, Cryotherapy, Moist heat, Taping, Traction, Ultrasound, Ionotophoresis 77m/ml Dexamethasone, Manual therapy, and Re-evaluation   PLAN FOR NEXT SESSION: Assess and progress HEP as indicated, strengthening, core strengthening, flexibility     Lucero Ide, PTA 01/12/2022, 11:32 AM

## 2022-01-16 ENCOUNTER — Ambulatory Visit: Payer: No Typology Code available for payment source

## 2022-01-16 DIAGNOSIS — M5459 Other low back pain: Secondary | ICD-10-CM

## 2022-01-16 DIAGNOSIS — M6281 Muscle weakness (generalized): Secondary | ICD-10-CM

## 2022-01-16 DIAGNOSIS — R262 Difficulty in walking, not elsewhere classified: Secondary | ICD-10-CM

## 2022-01-16 DIAGNOSIS — M25551 Pain in right hip: Secondary | ICD-10-CM

## 2022-01-16 NOTE — Therapy (Signed)
OUTPATIENT PHYSICAL THERAPY TREATMENT NOTE   Patient Name: Colleen Coffey MRN: [id number] DOB:1950/08/22, 71 y.o., female Today's Date: 01/16/2022  PCP: Colleen Conners, MD  REFERRING PROVIDER: Farrel Conners, MD   END OF SESSION:   PT End of Session - 01/16/22 1238     Visit Number 4    Date for PT Re-Evaluation 02/21/22    Authorization Type [employer]    Progress Note Due on Visit 10    PT Start Time 1236    PT Stop Time 1309    PT Time Calculation (min) 33 min    Activity Tolerance Patient tolerated treatment well    Behavior During Therapy [facility] for tasks assessed/performed              Past Medical History:  Diagnosis Date   Constipation    chronic, improved with healthy diet   Endocervical polyp    GERD (gastroesophageal reflux disease)    Hemorrhoids    resolved   Hypertension    Osteoarthritis, knee 06/27/2015   -saw Colleen Coffey ortho    Osteopenia 06/27/2015   -reports on medication remotely with gyn    Past Surgical History:  Procedure Laterality Date   APPENDECTOMY     BACK SURGERY  2014   bulging disc lumbar spine   BREAST CYST ASPIRATION Right    CESAREAN SECTION     2   CHOLECYSTECTOMY N/A 11/16/2020   Procedure: LAPAROSCOPIC CHOLECYSTECTOMY;  Surgeon: Colleen Kussmaul, MD;  Location: [place];  Service: General;  Laterality: N/A;   Patient Active Problem List   Diagnosis Date Noted   Muscle pain 10/19/2021   Prediabetes 10/19/2021   Hypertension 04/27/2020   Osteoarthritis, knee 06/27/2015   BMI 28.0-28.9,adult 06/27/2015   Constipation 06/27/2015   Osteopenia 06/27/2015    REFERRING DIAG: M25.551,M25.552 (ICD-10-CM) - Bilateral hip pain   THERAPY DIAG:  Other low back pain  Difficulty in walking, not elsewhere classified  Muscle weakness (generalized)  Bilateral hip pain  Rationale for Evaluation and Treatment Rehabilitation  PERTINENT HISTORY: HTN, OA, Osteopenia   PRECAUTIONS: None  SUBJECTIVE:                                                                                                                                                                                       SUBJECTIVE STATEMENT: I am doing pretty good.  It is definitely much better than it was.    PAIN:  Are you having pain? Yes: NPRS scale: 5/10 Pain location: Bil hips into thighs a bit Pain description: Throb Aggravating factors: When walking Relieving factors: Sitting/laying down   OBJECTIVE: (objective measures completed at  initial evaluation unless otherwise dated)   DIAGNOSTIC FINDINGS:  11/13/21 Bilat hip radiograph: IMPRESSION: 1. No acute findings. No significant degenerative change at either hip joint. 2. Mild degenerative sclerosis at the symphysis pubis.   PATIENT SURVEYS:  Eval:  FOTO 33% (projected 57% by visit 16)   COGNITION: Overall cognitive status: Within functional limits for tasks assessed                         SENSATION: Reports sometimes feeling numbness in her legs     MUSCLE LENGTH: Hamstrings: tightness noted bilaterally   POSTURE: rounded shoulders and forward head   PALPATION: Tenderness to palpation at bilateral hips, spasms noted along lumbar multifidi    LOWER EXTREMITY ROM:   WFL   LOWER EXTREMITY MMT:   12/27/2021:  BLE strength grossly 4/5 throughout   LOWER EXTREMITY SPECIAL TESTS:  Hip special tests: SI compression test: positive    FUNCTIONAL TESTS:  5 times sit to stand: 21.3 seconds   GAIT: Distance walked: >100 ft Assistive device utilized: None Level of assistance: Complete Independence Comments: Pt with antalgic gait noted     TODAY'S TREATMENT:  01/12/22: Nustep old model L3 7 min PT present to discuss current status LAQ Bil 2# 2 x 10 March x 20 with 2# Seated hip ER with 2# Seated clamshells blue loop 20x Sit to stand from low mat: 15x  Supine IT band stretch 3 x 30 sec  Supine hamstring with strap 4 breaths 3x with strap both Supine hip  abduction x 10 each hip Supine heel slides x 10 each LE Supine SLR (low range) x 10  01/12/22: Nustep old model L2 7 min wit hPTA present to discuss current status LAQ Bil 1.5# 15x  Seated clamshells blue loop 15x Seated marching with 1.5# 15x Sit to stand from low mat: 10x  Supine hamstring with strap 4 breaths 2x with strap LE mobilization medial/lateral with strap 10x Bil VC to relax leg in strap as she was trying to hold the leg up with her hip flexors and they kept cramping  01/01/22: Review and performance of initial HEP; minor Vc for technique and posture Self care: posture and avoiding seated compresison (slouching) Supine piriformis stretch and hip Ext rot AROM.  Nustep (new model) L4 x 6 min      Trial of Addaday to Bil proximal quads. Did not tolerate much more than 1 min on each.                                                                                                                          DATE: 12/27/2021 Reviewed HEP (see below for 1 set of each)  Nustep Level 5 x5 min with PT present to discuss status     PATIENT EDUCATION:  Education details: Issued HEP Person educated: Patient Education method: Explanation, Demonstration, and Handouts Education comprehension: verbalized understanding and returned demonstration   HOME EXERCISE PROGRAM: Access  Code: [facility] URL: [email or web address] Date: 12/27/2021 Prepared by: Colleen Coffey   Exercises - Seated Piriformis Stretch  - 1 x daily - 7 x weekly - 1 sets - 2 reps - 20 sec hold - Seated Hamstring Stretch  - 1 x daily - 7 x weekly - 1 sets - 2 reps - 20 sec hold - Seated Long Arc Quad  - 1 x daily - 7 x weekly - 2 sets - 10 reps - Seated March  - 1 x daily - 7 x weekly - 2 sets - 10 reps - Seated Hip Adduction Isometrics with Ball  - 1 x daily - 7 x weekly - 2 sets - 10 reps - Supine Posterior Pelvic Tilt  - 1 x daily - 7 x weekly - 2 sets - 10 reps - Bent Knee Fallouts  - 1 x daily - 7 x  weekly - 2 sets - 10 reps   ASSESSMENT:   CLINICAL IMPRESSION: Colleen Coffey is progressing appropriately.  She had no hip cramping today and was able to complete all tasks with min discomfort. We discussed alignment and how to practice this when going from sit to stand.  She tends to keep her BOS very narrow.  She also needed some minor verbal cues for keeping her knees more flexed and fwd weight shift for sit to stand as she was pitching backward and losing her balance on sit to stand.   She should continue to improve and would benefit from continued skilled PT to restore flexibility and proper LE mechanics to reduce pain.    OBJECTIVE IMPAIRMENTS: decreased balance, difficulty walking, decreased strength, increased muscle spasms, impaired flexibility, postural dysfunction, and pain.    ACTIVITY LIMITATIONS: standing, squatting, and transfers   PARTICIPATION LIMITATIONS: cleaning and community activity   PERSONAL FACTORS: Time since onset of injury/illness/exacerbation and 3+ comorbidities: OA, osteopenia, Hx of lumbar surgery  are also affecting patient's functional outcome.    REHAB POTENTIAL: Good   CLINICAL DECISION MAKING: Evolving/moderate complexity   EVALUATION COMPLEXITY: Moderate     GOALS: Goals reviewed with patient? No   SHORT TERM GOALS: Target date: 01/17/2022  Pt will be independent with initial HEP. Baseline: Goal status: Goal met:    01/12/22  2.  Pt will improve 5 times sit to//from stand to no greater than 15 seconds /or less to demonstrate improved functional strength. Baseline:  Goal status: INITIAL     LONG TERM GOALS: Target date: 02/21/2022    Pt will be independent with advanced HEP. Baseline:  Goal status: INITIAL   2.  Pt will increase BLE strength to at least 4+/5 to allow him to improve her ability to navigate stairs. Baseline: 4/5 Goal status: INITIAL   3.  Pt will report ability to ambulate for at least 20 minutes without increased  pain. Baseline:  Goal status: INITIAL   4.  Pt will report ability to return to working out in the gym without increased pain. Baseline:  Goal status: INITIAL   5.  Pt will increase FOTO to at least 57% to demonstrate improvements in functional mobility. Baseline: 33% Goal status: INITIAL         PLAN:   PT FREQUENCY: 2x/week   PT DURATION: 8 weeks   PLANNED INTERVENTIONS: Therapeutic exercises, Therapeutic activity, Neuromuscular re-education, Balance training, Gait training, Patient/Family education, Self Care, Joint mobilization, Joint manipulation, Stair training, Vestibular training, Canalith repositioning, Aquatic Therapy, Dry Needling, Electrical stimulation, Spinal manipulation, Spinal mobilization, Cryotherapy, Moist heat, Taping,  Traction, Ultrasound, Ionotophoresis 84m/ml Dexamethasone, Manual therapy, and Re-evaluation   PLAN FOR NEXT SESSION: Assess and progress HEP as indicated, strengthening, core strengthening, flexibility     Aivan Fillingim B. Jessic Standifer, PT 01/16/22 2:00 PM  [place] [place] [address]Phone # [phone] [phone]

## 2022-01-19 ENCOUNTER — Ambulatory Visit: Payer: No Typology Code available for payment source | Admitting: Rehabilitative and Restorative Service Providers"

## 2022-01-19 ENCOUNTER — Encounter: Payer: Self-pay | Admitting: Rehabilitative and Restorative Service Providers"

## 2022-01-19 DIAGNOSIS — R262 Difficulty in walking, not elsewhere classified: Secondary | ICD-10-CM

## 2022-01-19 DIAGNOSIS — M25551 Pain in right hip: Secondary | ICD-10-CM

## 2022-01-19 DIAGNOSIS — M5459 Other low back pain: Secondary | ICD-10-CM

## 2022-01-19 DIAGNOSIS — M6281 Muscle weakness (generalized): Secondary | ICD-10-CM

## 2022-01-19 DIAGNOSIS — R2689 Other abnormalities of gait and mobility: Secondary | ICD-10-CM

## 2022-01-19 NOTE — Therapy (Signed)
OUTPATIENT PHYSICAL THERAPY TREATMENT NOTE   Patient Name: Colleen Coffey MRN: [id number] DOB:05/11/1950, 71 y.o., female Today's Date: 01/19/2022  PCP: Farrel Conners, MD  REFERRING PROVIDER: Farrel Conners, MD   END OF SESSION:   PT End of Session - 01/19/22 1105     Visit Number 5    Date for PT Re-Evaluation 02/21/22    Authorization Type [employer]    Progress Note Due on Visit 10    PT Start Time 1100    PT Stop Time 1140    PT Time Calculation (min) 40 min    Activity Tolerance Patient tolerated treatment well    Behavior During Therapy [facility] for tasks assessed/performed              Past Medical History:  Diagnosis Date   Constipation    chronic, improved with healthy diet   Endocervical polyp    GERD (gastroesophageal reflux disease)    Hemorrhoids    resolved   Hypertension    Osteoarthritis, knee 06/27/2015   -saw murphy wainer ortho    Osteopenia 06/27/2015   -reports on medication remotely with gyn    Past Surgical History:  Procedure Laterality Date   APPENDECTOMY     BACK SURGERY  2014   bulging disc lumbar spine   BREAST CYST ASPIRATION Right    CESAREAN SECTION     2   CHOLECYSTECTOMY N/A 11/16/2020   Procedure: LAPAROSCOPIC CHOLECYSTECTOMY;  Surgeon: Jovita Kussmaul, MD;  Location: [place];  Service: General;  Laterality: N/A;   Patient Active Problem List   Diagnosis Date Noted   Muscle pain 10/19/2021   Prediabetes 10/19/2021   Hypertension 04/27/2020   Osteoarthritis, knee 06/27/2015   BMI 28.0-28.9,adult 06/27/2015   Constipation 06/27/2015   Osteopenia 06/27/2015    REFERRING DIAG: M25.551,M25.552 (ICD-10-CM) - Bilateral hip pain   THERAPY DIAG:  Other low back pain  Difficulty in walking, not elsewhere classified  Muscle weakness (generalized)  Bilateral hip pain  Other abnormalities of gait and mobility  Rationale for Evaluation and Treatment Rehabilitation  PERTINENT HISTORY: HTN, OA, Osteopenia    PRECAUTIONS: None  SUBJECTIVE:                                                                                                                                                                                      SUBJECTIVE STATEMENT: Pt reports that on a typical day without a lot of activities planned, she is approx 50% better since initial evaluation.  PAIN:  Are you having pain? Yes: NPRS scale: 5/10 Pain location: Bil hips into thighs a bit Pain description: Throb Aggravating factors: When  walking Relieving factors: Sitting/laying down   OBJECTIVE: (objective measures completed at initial evaluation unless otherwise dated)   DIAGNOSTIC FINDINGS:  11/13/21 Bilat hip radiograph: IMPRESSION: 1. No acute findings. No significant degenerative change at either hip joint. 2. Mild degenerative sclerosis at the symphysis pubis.   PATIENT SURVEYS:  Eval:  FOTO 33% (projected 57% by visit 16)   COGNITION: Overall cognitive status: Within functional limits for tasks assessed                         SENSATION: Reports sometimes feeling numbness in her legs     MUSCLE LENGTH: Hamstrings: tightness noted bilaterally   POSTURE: rounded shoulders and forward head   PALPATION: Tenderness to palpation at bilateral hips, spasms noted along lumbar multifidi    LOWER EXTREMITY ROM:   WFL   LOWER EXTREMITY MMT:   12/27/2021:  BLE strength grossly 4/5 throughout   LOWER EXTREMITY SPECIAL TESTS:  Hip special tests: SI compression test: positive    FUNCTIONAL TESTS:  5 times sit to stand: 21.3 seconds   GAIT: Distance walked: >100 ft Assistive device utilized: None Level of assistance: Complete Independence Comments: Pt with antalgic gait noted     TODAY'S TREATMENT:   01/19/2022: Nustep old model L3 x7 min PT present to discuss current status Seated with 2#:  heel/toe raises, marching, LAQ, hip abduction scissors, hip ER.  BLE 2x10 Supine hamstring with strap 4 breaths  5x with strap both Supine IT band stretch 5 x 20 sec  Manual Therapy in prone:  Addaday to lumbar paraspinals, glutes/piriformis, IT band/TFL, and hamstrings with gentle pressure applied to areas of trigger points.   01/12/22: Nustep old model L3 7 min PT present to discuss current status LAQ Bil 2# 2 x 10 March x 20 with 2# Seated hip ER with 2# Seated clamshells blue loop 20x Sit to stand from low mat: 15x  Supine IT band stretch 3 x 30 sec  Supine hamstring with strap 4 breaths 3x with strap both Supine hip abduction x 10 each hip Supine heel slides x 10 each LE Supine SLR (low range) x 10  01/12/22: Nustep old model L2 7 min wit hPTA present to discuss current status LAQ Bil 1.5# 15x  Seated clamshells blue loop 15x Seated marching with 1.5# 15x Sit to stand from low mat: 10x  Supine hamstring with strap 4 breaths 2x with strap LE mobilization medial/lateral with strap 10x Bil VC to relax leg in strap as she was trying to hold the leg up with her hip flexors and they kept cramping      PATIENT EDUCATION:  Education details: Issued HEP Person educated: Patient Education method: Explanation, Demonstration, and Handouts Education comprehension: verbalized understanding and returned demonstration   HOME EXERCISE PROGRAM: Access Code: [email or web address] URL: [email or web address] Date: 12/27/2021 Prepared by: Shelby Dubin Leani Myron   Exercises - Seated Piriformis Stretch  - 1 x daily - 7 x weekly - 1 sets - 2 reps - 20 sec hold - Seated Hamstring Stretch  - 1 x daily - 7 x weekly - 1 sets - 2 reps - 20 sec hold - Seated Long Arc Quad  - 1 x daily - 7 x weekly - 2 sets - 10 reps - Seated March  - 1 x daily - 7 x weekly - 2 sets - 10 reps - Seated Hip Adduction Isometrics with Ball  - 1 x daily - 7 x weekly - 2 sets -  10 reps - Supine Posterior Pelvic Tilt  - 1 x daily - 7 x weekly - 2 sets - 10 reps - Bent Knee Fallouts  - 1 x daily - 7 x weekly - 2 sets - 10 reps    ASSESSMENT:   CLINICAL IMPRESSION: Colleen Coffey continues to present with 5/10 pain today, stating improvement overall, but some days are better than others.  Patient able to perform exercises in gym and states that she gets the most relief with stretching.  Patient with great relief of pain following manual therapy with Addaday reporting pain decreased to 2/10.  Pt advised to continue to perform HEP and work on core stabilization and stretching.  Pt continues to require skilled PT to progress towards goal related activities.  OBJECTIVE IMPAIRMENTS: decreased balance, difficulty walking, decreased strength, increased muscle spasms, impaired flexibility, postural dysfunction, and pain.    ACTIVITY LIMITATIONS: standing, squatting, and transfers   PARTICIPATION LIMITATIONS: cleaning and community activity   PERSONAL FACTORS: Time since onset of injury/illness/exacerbation and 3+ comorbidities: OA, osteopenia, Hx of lumbar surgery  are also affecting patient's functional outcome.    REHAB POTENTIAL: Good   CLINICAL DECISION MAKING: Evolving/moderate complexity   EVALUATION COMPLEXITY: Moderate     GOALS: Goals reviewed with patient? No   SHORT TERM GOALS: Target date: 01/17/2022  Pt will be independent with initial HEP. Baseline: Goal status: Goal met on12/1/23  2.  Pt will improve 5 times sit to//from stand to no greater than 15 seconds /or less to demonstrate improved functional strength. Baseline:  Goal status: Ongoing     LONG TERM GOALS: Target date: 02/21/2022    Pt will be independent with advanced HEP. Baseline:  Goal status: Ongoing   2.  Pt will increase BLE strength to at least 4+/5 to allow him to improve her ability to navigate stairs. Baseline: 4/5 Goal status: INITIAL   3.  Pt will report ability to ambulate for at least 20 minutes without increased pain. Baseline:  Goal status: INITIAL   4.  Pt will report ability to return to working out in the gym without  increased pain. Baseline:  Goal status: INITIAL   5.  Pt will increase FOTO to at least 57% to demonstrate improvements in functional mobility. Baseline: 33% Goal status: INITIAL         PLAN:   PT FREQUENCY: 2x/week   PT DURATION: 8 weeks   PLANNED INTERVENTIONS: Therapeutic exercises, Therapeutic activity, Neuromuscular re-education, Balance training, Gait training, Patient/Family education, Self Care, Joint mobilization, Joint manipulation, Stair training, Vestibular training, Canalith repositioning, Aquatic Therapy, Dry Needling, Electrical stimulation, Spinal manipulation, Spinal mobilization, Cryotherapy, Moist heat, Taping, Traction, Ultrasound, Ionotophoresis 35m/ml Dexamethasone, Manual therapy, and Re-evaluation   PLAN FOR NEXT SESSION: Assess and progress HEP as indicated, strengthening, core strengthening, flexibility     SJuel Burrow PT 01/19/22 11:52 AM   [facility] [address] [place]ford[place] [address]Phone # [phone] [phone]

## 2022-01-22 ENCOUNTER — Encounter: Payer: Self-pay | Admitting: Physical Therapy

## 2022-01-22 ENCOUNTER — Ambulatory Visit: Payer: No Typology Code available for payment source | Admitting: Physical Therapy

## 2022-01-22 DIAGNOSIS — R262 Difficulty in walking, not elsewhere classified: Secondary | ICD-10-CM

## 2022-01-22 DIAGNOSIS — M5459 Other low back pain: Secondary | ICD-10-CM | POA: Diagnosis not present

## 2022-01-22 DIAGNOSIS — M25552 Pain in left hip: Secondary | ICD-10-CM

## 2022-01-22 DIAGNOSIS — M6281 Muscle weakness (generalized): Secondary | ICD-10-CM

## 2022-01-22 DIAGNOSIS — R2689 Other abnormalities of gait and mobility: Secondary | ICD-10-CM

## 2022-01-22 NOTE — Therapy (Signed)
OUTPATIENT PHYSICAL THERAPY TREATMENT NOTE   Patient Name: Colleen Coffey MRN: [id number] DOB:03-09-1950, 71 y.o., female Today's Date: 01/22/2022  PCP: Farrel Conners, MD  REFERRING PROVIDER: Farrel Conners, MD   END OF SESSION:   PT End of Session - 01/22/22 1143     Visit Number 6    Date for PT Re-Evaluation 02/21/22    Authorization Type [employer]    Progress Note Due on Visit 10    PT Start Time 1143    PT Stop Time 1223    PT Time Calculation (min) 40 min    Activity Tolerance Patient tolerated treatment well    Behavior During Therapy [facility] for tasks assessed/performed               Past Medical History:  Diagnosis Date   Constipation    chronic, improved with healthy diet   Endocervical polyp    GERD (gastroesophageal reflux disease)    Hemorrhoids    resolved   Hypertension    Osteoarthritis, knee 06/27/2015   -saw [facility]    Osteopenia 06/27/2015   -reports on medication remotely with gyn    Past Surgical History:  Procedure Laterality Date   APPENDECTOMY     BACK SURGERY  2014   bulging disc lumbar spine   BREAST CYST ASPIRATION Right    CESAREAN SECTION     2   CHOLECYSTECTOMY N/A 11/16/2020   Procedure: LAPAROSCOPIC CHOLECYSTECTOMY;  Surgeon: Jovita Kussmaul, MD;  Location: [place];  Service: General;  Laterality: N/A;   Patient Active Problem List   Diagnosis Date Noted   Muscle pain 10/19/2021   Prediabetes 10/19/2021   Hypertension 04/27/2020   Osteoarthritis, knee 06/27/2015   BMI 28.0-28.9,adult 06/27/2015   Constipation 06/27/2015   Osteopenia 06/27/2015    REFERRING DIAG: M25.551,M25.552 (ICD-10-CM) - Bilateral hip pain   THERAPY DIAG:  Other low back pain  Difficulty in walking, not elsewhere classified  Muscle weakness (generalized)  Bilateral hip pain  Other abnormalities of gait and mobility  Rationale for Evaluation and Treatment Rehabilitation  PERTINENT HISTORY: HTN, OA, Osteopenia    PRECAUTIONS: None  SUBJECTIVE:                                                                                                                                                                                      SUBJECTIVE STATEMENT: The massage was great, haven't had much pain since.   PAIN:  Are you having pain? Not right now   OBJECTIVE: (objective measures completed at initial evaluation unless otherwise dated)   DIAGNOSTIC FINDINGS:  11/13/21 Bilat hip radiograph: IMPRESSION: 1. No acute  findings. No significant degenerative change at either hip joint. 2. Mild degenerative sclerosis at the symphysis pubis.   PATIENT SURVEYS:  Eval:  FOTO 33% (projected 57% by visit 16)   COGNITION: Overall cognitive status: Within functional limits for tasks assessed                         SENSATION: Reports sometimes feeling numbness in her legs     MUSCLE LENGTH: Hamstrings: tightness noted bilaterally   POSTURE: rounded shoulders and forward head   PALPATION: Tenderness to palpation at bilateral hips, spasms noted along lumbar multifidi    LOWER EXTREMITY ROM:   WFL   LOWER EXTREMITY MMT:   12/27/2021:  BLE strength grossly 4/5 throughout   LOWER EXTREMITY SPECIAL TESTS:  Hip special tests: SI compression test: positive    FUNCTIONAL TESTS:  5 times sit to stand: 21.3 seconds   GAIT: Distance walked: >100 ft Assistive device utilized: None Level of assistance: Complete Independence Comments: Pt with antalgic gait noted     TODAY'S TREATMENT:   01/22/22: Nustep old model L3 x8 min PTA present to discuss current status Seated with 3#:  heel/toe raises, marching, LAQ  BLE 2x10 Supine hamstring with strap 4 breaths 5x with strap both Supine IT band stretch 5 x 20 sec  Supine clamshells yellow band 2x10 Manual Therapy in prone:  Addaday to lumbar paraspinals, glutes/piriformis, IT band/TFL, and hamstrings with gentle pressure applied to areas of trigger  points.  01/19/2022: Nustep old model L3 x7 min PT present to discuss current status Seated with 2#:  heel/toe raises, marching, LAQ, hip abduction scissors, hip ER.  BLE 2x10 Supine hamstring with strap 4 breaths 5x with strap both Supine IT band stretch 5 x 20 sec  Manual Therapy in prone:  Addaday to lumbar paraspinals, glutes/piriformis, IT band/TFL, and hamstrings with gentle pressure applied to areas of trigger points.   01/12/22: Nustep old model L3 7 min PT present to discuss current status LAQ Bil 2# 2 x 10 March x 20 with 2# Seated hip ER with 2# Seated clamshells blue loop 20x Sit to stand from low mat: 15x  Supine IT band stretch 3 x 30 sec  Supine hamstring with strap 4 breaths 3x with strap both Supine hip abduction x 10 each hip Supine heel slides x 10 each LE Supine SLR (low range) x 10     PATIENT EDUCATION:  Education details: Issued HEP Person educated: Patient Education method: Explanation, Demonstration, and Handouts Education comprehension: verbalized understanding and returned demonstration   HOME EXERCISE PROGRAM: Access Code: [email or web address] URL: [email or web address] Date: 12/27/2021 Prepared by: Shelby Dubin Menke   Exercises - Seated Piriformis Stretch  - 1 x daily - 7 x weekly - 1 sets - 2 reps - 20 sec hold - Seated Hamstring Stretch  - 1 x daily - 7 x weekly - 1 sets - 2 reps - 20 sec hold - Seated Long Arc Quad  - 1 x daily - 7 x weekly - 2 sets - 10 reps - Seated March  - 1 x daily - 7 x weekly - 2 sets - 10 reps - Seated Hip Adduction Isometrics with Ball  - 1 x daily - 7 x weekly - 2 sets - 10 reps - Supine Posterior Pelvic Tilt  - 1 x daily - 7 x weekly - 2 sets - 10 reps - Bent Knee Fallouts  - 1 x daily - 7 x weekly -  2 sets - 10 reps   ASSESSMENT:   CLINICAL IMPRESSION:  Pt reports no pain since last sessison. She felt the massage really helped get everything loosened up.   OBJECTIVE IMPAIRMENTS: decreased balance, difficulty  walking, decreased strength, increased muscle spasms, impaired flexibility, postural dysfunction, and pain.    ACTIVITY LIMITATIONS: standing, squatting, and transfers   PARTICIPATION LIMITATIONS: cleaning and community activity   PERSONAL FACTORS: Time since onset of injury/illness/exacerbation and 3+ comorbidities: OA, osteopenia, Hx of lumbar surgery  are also affecting patient's functional outcome.    REHAB POTENTIAL: Good   CLINICAL DECISION MAKING: Evolving/moderate complexity   EVALUATION COMPLEXITY: Moderate     GOALS: Goals reviewed with patient? No   SHORT TERM GOALS: Target date: 01/17/2022  Pt will be independent with initial HEP. Baseline: Goal status: Goal met on12/1/23  2.  Pt will improve 5 times sit to//from stand to no greater than 15 seconds /or less to demonstrate improved functional strength. Baseline:  Goal status: Ongoing     LONG TERM GOALS: Target date: 02/21/2022    Pt will be independent with advanced HEP. Baseline:  Goal status: Ongoing   2.  Pt will increase BLE strength to at least 4+/5 to allow him to improve her ability to navigate stairs. Baseline: 4/5 Goal status: INITIAL   3.  Pt will report ability to ambulate for at least 20 minutes without increased pain. Baseline:  Goal status: INITIAL   4.  Pt will report ability to return to working out in the gym without increased pain. Baseline:  Goal status: INITIAL   5.  Pt will increase FOTO to at least 57% to demonstrate improvements in functional mobility. Baseline: 33% Goal status: INITIAL         PLAN:   PT FREQUENCY: 2x/week   PT DURATION: 8 weeks   PLANNED INTERVENTIONS: Therapeutic exercises, Therapeutic activity, Neuromuscular re-education, Balance training, Gait training, Patient/Family education, Self Care, Joint mobilization, Joint manipulation, Stair training, Vestibular training, Canalith repositioning, Aquatic Therapy, Dry Needling, Electrical stimulation, Spinal  manipulation, Spinal mobilization, Cryotherapy, Moist heat, Taping, Traction, Ultrasound, Ionotophoresis 38m/ml Dexamethasone, Manual therapy, and Re-evaluation   PLAN FOR NEXT SESSION: Assess and progress HEP as indicated, strengthening, core strengthening, flexibility    JMyrene Galas PTA 01/22/22 12:24 PM   [address] [place] [address]Phone # [phone] [phone]

## 2022-01-26 ENCOUNTER — Ambulatory Visit: Payer: No Typology Code available for payment source

## 2022-01-26 DIAGNOSIS — M5459 Other low back pain: Secondary | ICD-10-CM

## 2022-01-26 DIAGNOSIS — M6281 Muscle weakness (generalized): Secondary | ICD-10-CM

## 2022-01-26 DIAGNOSIS — R252 Cramp and spasm: Secondary | ICD-10-CM

## 2022-01-26 DIAGNOSIS — M25551 Pain in right hip: Secondary | ICD-10-CM

## 2022-01-26 DIAGNOSIS — R293 Abnormal posture: Secondary | ICD-10-CM

## 2022-01-26 DIAGNOSIS — R262 Difficulty in walking, not elsewhere classified: Secondary | ICD-10-CM

## 2022-01-26 NOTE — Therapy (Signed)
OUTPATIENT PHYSICAL THERAPY TREATMENT NOTE   Patient Name: Colleen Coffey MRN: [id number] DOB:19-May-1950, 71 y.o., female Today's Date: 01/26/2022  PCP: Farrel Conners, MD  REFERRING PROVIDER: Farrel Conners, MD   END OF SESSION:   PT End of Session - 01/26/22 1011     Visit Number 7    Date for PT Re-Evaluation 02/21/22    Authorization Type [employer]    Progress Note Due on Visit 10    PT Start Time 1011    PT Stop Time 1054    PT Time Calculation (min) 43 min    Activity Tolerance Patient tolerated treatment well    Behavior During Therapy [facility] for tasks assessed/performed               Past Medical History:  Diagnosis Date   Constipation    chronic, improved with healthy diet   Endocervical polyp    GERD (gastroesophageal reflux disease)    Hemorrhoids    resolved   Hypertension    Osteoarthritis, knee 06/27/2015   -saw [facility]    Osteopenia 06/27/2015   -reports on medication remotely with gyn    Past Surgical History:  Procedure Laterality Date   APPENDECTOMY     BACK SURGERY  2014   bulging disc lumbar spine   BREAST CYST ASPIRATION Right    CESAREAN SECTION     2   CHOLECYSTECTOMY N/A 11/16/2020   Procedure: LAPAROSCOPIC CHOLECYSTECTOMY;  Surgeon: Jovita Kussmaul, MD;  Location: [place];  Service: General;  Laterality: N/A;   Patient Active Problem List   Diagnosis Date Noted   Muscle pain 10/19/2021   Prediabetes 10/19/2021   Hypertension 04/27/2020   Osteoarthritis, knee 06/27/2015   BMI 28.0-28.9,adult 06/27/2015   Constipation 06/27/2015   Osteopenia 06/27/2015    REFERRING DIAG: M25.551,M25.552 (ICD-10-CM) - Bilateral hip pain   THERAPY DIAG:  Other low back pain  Difficulty in walking, not elsewhere classified  Muscle weakness (generalized)  Bilateral hip pain  Cramp and spasm  Abnormal posture  Rationale for Evaluation and Treatment Rehabilitation  PERTINENT HISTORY: HTN, OA, Osteopenia    PRECAUTIONS: None  SUBJECTIVE:                                                                                                                                                                                      SUBJECTIVE STATEMENT: Patient reports "doing pretty good".   Pain reported at 3/10.  "I haven't really done anything yet".   PAIN:  Are you having pain? Not right now   OBJECTIVE: (objective measures completed at initial evaluation unless otherwise dated)   DIAGNOSTIC FINDINGS:  11/13/21 Bilat hip radiograph: IMPRESSION: 1. No acute findings. No significant degenerative change at either hip joint. 2. Mild degenerative sclerosis at the symphysis pubis.   PATIENT SURVEYS:  Eval:  FOTO 33% (projected 57% by visit 16)   COGNITION: Overall cognitive status: Within functional limits for tasks assessed                         SENSATION: Reports sometimes feeling numbness in her legs     MUSCLE LENGTH: Hamstrings: tightness noted bilaterally   POSTURE: rounded shoulders and forward head   PALPATION: Tenderness to palpation at bilateral hips, spasms noted along lumbar multifidi    LOWER EXTREMITY ROM:   WFL   LOWER EXTREMITY MMT:   12/27/2021:  BLE strength grossly 4/5 throughout   LOWER EXTREMITY SPECIAL TESTS:  Hip special tests: SI compression test: positive    FUNCTIONAL TESTS:  5 times sit to stand: 21.3 seconds   GAIT: Distance walked: >100 ft Assistive device utilized: None Level of assistance: Complete Independence Comments: Pt with antalgic gait noted     TODAY'S TREATMENT:   01/26/22: Nustep old model L3 x 8 min PT present to discuss current status Seated with 3#:  heel/toe raises, marching, LAQ  BLE 2x10 Supine clamshells yellow band 2x10 Supine hamstring with strap 4 breaths 5x with strap both Supine IT band stretch 5 x 20 sec  Manual Therapy in prone:  Addaday to lumbar paraspinals, glutes/piriformis, IT band/TFL, and hamstrings with  gentle pressure applied to areas of trigger points.  01/22/22: Nustep old model L3 x8 min PTA present to discuss current status Seated with 3#:  heel/toe raises, marching, LAQ  BLE 2x10 Supine hamstring with strap 4 breaths 5x with strap both Supine IT band stretch 5 x 20 sec  Supine clamshells yellow band 2x10 Manual Therapy in prone:  Addaday to lumbar paraspinals, glutes/piriformis, IT band/TFL, and hamstrings with gentle pressure applied to areas of trigger points.  01/19/2022: Nustep old model L3 x7 min PT present to discuss current status Seated with 2#:  heel/toe raises, marching, LAQ, hip abduction scissors, hip ER.  BLE 2x10 Supine hamstring with strap 4 breaths 5x with strap both Supine IT band stretch 5 x 20 sec  Manual Therapy in prone:  Addaday to lumbar paraspinals, glutes/piriformis, IT band/TFL, and hamstrings with gentle pressure applied to areas of trigger points. X 10 min   PATIENT EDUCATION:  Education details: Issued HEP Person educated: Patient Education method: Explanation, Demonstration, and Handouts Education comprehension: verbalized understanding and returned demonstration   HOME EXERCISE PROGRAM: Access Code: [email or web address] URL: [email or web address] Date: 12/27/2021 Prepared by: Shelby Dubin Menke   Exercises - Seated Piriformis Stretch  - 1 x daily - 7 x weekly - 1 sets - 2 reps - 20 sec hold - Seated Hamstring Stretch  - 1 x daily - 7 x weekly - 1 sets - 2 reps - 20 sec hold - Seated Long Arc Quad  - 1 x daily - 7 x weekly - 2 sets - 10 reps - Seated March  - 1 x daily - 7 x weekly - 2 sets - 10 reps - Seated Hip Adduction Isometrics with Ball  - 1 x daily - 7 x weekly - 2 sets - 10 reps - Supine Posterior Pelvic Tilt  - 1 x daily - 7 x weekly - 2 sets - 10 reps - Bent Knee Fallouts  - 1 x daily - 7 x weekly - 2  sets - 10 reps   ASSESSMENT:   CLINICAL IMPRESSION:  Patient is progressing appropriately.  She has significant decrease in pain but  we have been focused on soft tissue mobility.  She would benefit from progressing toward more core strength as tolerated.  She was able to complete all tasks today without increased pain.  She needed verbal cues for correct technique on stretches.  Her leg was getting tired.  She was not using the strap to hold the leg up sufficiently.  She would benefit from continued skilled PT for LE flexibility and core stabilization as well as soft tissue mobility to control pain.     OBJECTIVE IMPAIRMENTS: decreased balance, difficulty walking, decreased strength, increased muscle spasms, impaired flexibility, postural dysfunction, and pain.    ACTIVITY LIMITATIONS: standing, squatting, and transfers   PARTICIPATION LIMITATIONS: cleaning and community activity   PERSONAL FACTORS: Time since onset of injury/illness/exacerbation and 3+ comorbidities: OA, osteopenia, Hx of lumbar surgery  are also affecting patient's functional outcome.    REHAB POTENTIAL: Good   CLINICAL DECISION MAKING: Evolving/moderate complexity   EVALUATION COMPLEXITY: Moderate     GOALS: Goals reviewed with patient? No   SHORT TERM GOALS: Target date: 01/17/2022  Pt will be independent with initial HEP. Baseline: Goal status: Goal met on12/1/23  2.  Pt will improve 5 times sit to//from stand to no greater than 15 seconds /or less to demonstrate improved functional strength. Baseline:  Goal status: Ongoing     LONG TERM GOALS: Target date: 02/21/2022    Pt will be independent with advanced HEP. Baseline:  Goal status: Ongoing   2.  Pt will increase BLE strength to at least 4+/5 to allow him to improve her ability to navigate stairs. Baseline: 4/5 Goal status: INITIAL   3.  Pt will report ability to ambulate for at least 20 minutes without increased pain. Baseline:  Goal status: INITIAL   4.  Pt will report ability to return to working out in the gym without increased pain. Baseline:  Goal status: INITIAL   5.   Pt will increase FOTO to at least 57% to demonstrate improvements in functional mobility. Baseline: 33% Goal status: INITIAL         PLAN:   PT FREQUENCY: 2x/week   PT DURATION: 8 weeks   PLANNED INTERVENTIONS: Therapeutic exercises, Therapeutic activity, Neuromuscular re-education, Balance training, Gait training, Patient/Family education, Self Care, Joint mobilization, Joint manipulation, Stair training, Vestibular training, Canalith repositioning, Aquatic Therapy, Dry Needling, Electrical stimulation, Spinal manipulation, Spinal mobilization, Cryotherapy, Moist heat, Taping, Traction, Ultrasound, Ionotophoresis 55m/ml Dexamethasone, Manual therapy, and Re-evaluation   PLAN FOR NEXT SESSION: Assess and progress HEP as indicated, strengthening, core strengthening, flexibility    Nyelli Samara B. Francys Bolin, PT 01/26/22 10:55 AM   [address] [place] [address]Phone # [phone] [phone]

## 2022-01-29 ENCOUNTER — Ambulatory Visit: Payer: No Typology Code available for payment source | Admitting: Physical Therapy

## 2022-01-29 ENCOUNTER — Encounter: Payer: Self-pay | Admitting: Physical Therapy

## 2022-01-29 DIAGNOSIS — M25551 Pain in right hip: Secondary | ICD-10-CM

## 2022-01-29 DIAGNOSIS — M5459 Other low back pain: Secondary | ICD-10-CM

## 2022-01-29 DIAGNOSIS — R293 Abnormal posture: Secondary | ICD-10-CM

## 2022-01-29 DIAGNOSIS — M6281 Muscle weakness (generalized): Secondary | ICD-10-CM

## 2022-01-29 DIAGNOSIS — R2689 Other abnormalities of gait and mobility: Secondary | ICD-10-CM

## 2022-01-29 DIAGNOSIS — R262 Difficulty in walking, not elsewhere classified: Secondary | ICD-10-CM

## 2022-01-29 DIAGNOSIS — R252 Cramp and spasm: Secondary | ICD-10-CM

## 2022-01-29 NOTE — Patient Instructions (Signed)
   Lifting Principles  .Maintain proper posture and head alignment. .Slide object as close as possible before lifting. .Move obstacles out of the way. .Test before lifting; ask for help if too heavy. .Tighten stomach muscles without holding breath. .Use smooth movements; do not jerk. .Use legs to do the work, and pivot with feet. .Distribute the work load symmetrically and close to the center of trunk. .Push instead of pull whenever possible.   Squat down and hold basket close to stand. Use leg muscles to do the work.    Avoid twisting or bending back. Pivot around using foot movements, and bend at knees if needed when reaching for articles.        Getting Into / Out of Bed   Lower self to lie down on one side by raising legs and lowering head at the same time. Use arms to assist moving without twisting. Bend both knees to roll onto back if desired. To sit up, start from lying on side, and use same move-ments in reverse. Keep trunk aligned with legs.    Shift weight from front foot to back foot as item is lifted off shelf.    When leaning forward to pick object up from floor, extend one leg out behind. Keep back straight. Hold onto a sturdy support with other hand.      Sit upright, head facing forward. Try using a roll to support lower back. Keep shoulders relaxed, and avoid rounded back. Keep hips level with knees. Avoid crossing legs for long periods.     

## 2022-01-29 NOTE — Therapy (Signed)
OUTPATIENT PHYSICAL THERAPY TREATMENT NOTE   Patient Name: Colleen Coffey MRN: [id number] DOB:1950/04/19, 71 y.o., female Today's Date: 01/29/2022  PCP: Farrel Conners, MD  REFERRING PROVIDER: Farrel Conners, MD   END OF SESSION:   PT End of Session - 01/29/22 1145     Visit Number 8    Date for PT Re-Evaluation 02/21/22    Authorization Type [employer]    Progress Note Due on Visit 10    PT Start Time 1143    PT Stop Time 1223    PT Time Calculation (min) 40 min    Activity Tolerance Patient tolerated treatment well    Behavior During Therapy [facility] for tasks assessed/performed                Past Medical History:  Diagnosis Date   Constipation    chronic, improved with healthy diet   Endocervical polyp    GERD (gastroesophageal reflux disease)    Hemorrhoids    resolved   Hypertension    Osteoarthritis, knee 06/27/2015   -saw [facility]    Osteopenia 06/27/2015   -reports on medication remotely with gyn    Past Surgical History:  Procedure Laterality Date   APPENDECTOMY     BACK SURGERY  2014   bulging disc lumbar spine   BREAST CYST ASPIRATION Right    CESAREAN SECTION     2   CHOLECYSTECTOMY N/A 11/16/2020   Procedure: LAPAROSCOPIC CHOLECYSTECTOMY;  Surgeon: Jovita Kussmaul, MD;  Location: [place];  Service: General;  Laterality: N/A;   Patient Active Problem List   Diagnosis Date Noted   Muscle pain 10/19/2021   Prediabetes 10/19/2021   Hypertension 04/27/2020   Osteoarthritis, knee 06/27/2015   BMI 28.0-28.9,adult 06/27/2015   Constipation 06/27/2015   Osteopenia 06/27/2015    REFERRING DIAG: M25.551,M25.552 (ICD-10-CM) - Bilateral hip pain   THERAPY DIAG:  Other low back pain  Difficulty in walking, not elsewhere classified  Muscle weakness (generalized)  Bilateral hip pain  Cramp and spasm  Other abnormalities of gait and mobility  Abnormal posture  Rationale for Evaluation and Treatment  Rehabilitation  PERTINENT HISTORY: HTN, OA, Osteopenia   PRECAUTIONS: None  SUBJECTIVE:                                                                                                                                                                                      SUBJECTIVE STATEMENT: Saturday I had a really bad day with my back after doing a lot of house work.  PAIN:  Are you having pain? Not right now.   OBJECTIVE: (objective measures completed at initial evaluation unless  otherwise dated)   DIAGNOSTIC FINDINGS:  11/13/21 Bilat hip radiograph: IMPRESSION: 1. No acute findings. No significant degenerative change at either hip joint. 2. Mild degenerative sclerosis at the symphysis pubis.   PATIENT SURVEYS:  Eval:  FOTO 33% (projected 57% by visit 16)   COGNITION: Overall cognitive status: Within functional limits for tasks assessed                         SENSATION: Reports sometimes feeling numbness in her legs     MUSCLE LENGTH: Hamstrings: tightness noted bilaterally   POSTURE: rounded shoulders and forward head   PALPATION: Tenderness to palpation at bilateral hips, spasms noted along lumbar multifidi    LOWER EXTREMITY ROM:   WFL   LOWER EXTREMITY MMT:   12/27/2021:  BLE strength grossly 4/5 throughout   LOWER EXTREMITY SPECIAL TESTS:  Hip special tests: SI compression test: positive    FUNCTIONAL TESTS:  5 times sit to stand: 21.3 seconds   GAIT: Distance walked: >100 ft Assistive device utilized: None Level of assistance: Complete Independence Comments: Pt with antalgic gait noted     TODAY'S TREATMENT:   01/29/22: Nustep new model L3x 41mn PTA present to discuss current status Body mechanics education for ADLS that are lumbar protective Supine red band clamshell 2x10 VC for small pelvic tilt Supine hamstring with strap 4 breaths 5x with strap both Supine IT band stretch 5 x 20 sec Supine bridge 2x10 Supine dying bug UE holding 2#  wts 10x2  Supine SKC with emphasis on straight leg stretching long Bil 2x 3 breaths Prone hip ext 10x Bil with pillow under pelvis CArdine Engpose 3 breaths Standing Post pelvic tilt against wall 10x  01/26/22: Nustep old model L3 x 8 min PT present to discuss current status Seated with 3#:  heel/toe raises, marching, LAQ  BLE 2x10 Supine clamshells yellow band 2x10 Supine hamstring with strap 4 breaths 5x with strap both Supine IT band stretch 5 x 20 sec  Manual Therapy in prone:  Addaday to lumbar paraspinals, glutes/piriformis, IT band/TFL, and hamstrings with gentle pressure applied to areas of trigger points.  01/22/22: Nustep old model L3 x8 min PTA present to discuss current status Seated with 3#:  heel/toe raises, marching, LAQ  BLE 2x10 Supine hamstring with strap 4 breaths 5x with strap both Supine IT band stretch 5 x 20 sec  Supine clamshells yellow band 2x10 Manual Therapy in prone:  Addaday to lumbar paraspinals, glutes/piriformis, IT band/TFL, and hamstrings with gentle pressure applied to areas of trigger points.  01/19/2022: Nustep old model L3 x7 min PT present to discuss current status Seated with 2#:  heel/toe raises, marching, LAQ, hip abduction scissors, hip ER.  BLE 2x10 Supine hamstring with strap 4 breaths 5x with strap both Supine IT band stretch 5 x 20 sec  Manual Therapy in prone:  Addaday to lumbar paraspinals, glutes/piriformis, IT band/TFL, and hamstrings with gentle pressure applied to areas of trigger points. X 10 min   PATIENT EDUCATION:  Education details: Issued HEP Person educated: Patient Education method: Explanation, Demonstration, and Handouts Education comprehension: verbalized understanding and returned demonstration   HOME EXERCISE PROGRAM: Access Code: [email or web address]: [email or web address] Date: 12/27/2021 Prepared by: SShelby DubinMenke   Exercises - Seated Piriformis Stretch  - 1 x daily - 7 x weekly - 1 sets - 2 reps - 20 sec  hold - Seated Hamstring Stretch  - 1 x daily - 7 x weekly - 1  sets - 2 reps - 20 sec hold - Seated Long Arc Quad  - 1 x daily - 7 x weekly - 2 sets - 10 reps - Seated March  - 1 x daily - 7 x weekly - 2 sets - 10 reps - Seated Hip Adduction Isometrics with Ball  - 1 x daily - 7 x weekly - 2 sets - 10 reps - Supine Posterior Pelvic Tilt  - 1 x daily - 7 x weekly - 2 sets - 10 reps - Bent Knee Fallouts  - 1 x daily - 7 x weekly - 2 sets - 10 reps   ASSESSMENT:   CLINICAL IMPRESSION: Pt reports increased back pain this past weekend after doing housework. She was educated in lumbar protective body mechanics for ADLS with handouts given for pt to refer to . She did verbally understand all concepts. Encouraged pt to continue stretching her hip flexors and practice her pelvic tilt to decrease her anterior pelvic tilt.     OBJECTIVE IMPAIRMENTS: decreased balance, difficulty walking, decreased strength, increased muscle spasms, impaired flexibility, postural dysfunction, and pain.    ACTIVITY LIMITATIONS: standing, squatting, and transfers   PARTICIPATION LIMITATIONS: cleaning and community activity   PERSONAL FACTORS: Time since onset of injury/illness/exacerbation and 3+ comorbidities: OA, osteopenia, Hx of lumbar surgery  are also affecting patient's functional outcome.    REHAB POTENTIAL: Good   CLINICAL DECISION MAKING: Evolving/moderate complexity   EVALUATION COMPLEXITY: Moderate     GOALS: Goals reviewed with patient? No   SHORT TERM GOALS: Target date: 01/17/2022  Pt will be independent with initial HEP. Baseline: Goal status: Goal met on12/1/23  2.  Pt will improve 5 times sit to//from stand to no greater than 15 seconds /or less to demonstrate improved functional strength. Baseline:  Goal status: Ongoing     LONG TERM GOALS: Target date: 02/21/2022    Pt will be independent with advanced HEP. Baseline:  Goal status: Ongoing   2.  Pt will increase BLE strength to at  least 4+/5 to allow him to improve her ability to navigate stairs. Baseline: 4/5 Goal status: INITIAL   3.  Pt will report ability to ambulate for at least 20 minutes without increased pain. Baseline:  Goal status: INITIAL   4.  Pt will report ability to return to working out in the gym without increased pain. Baseline:  Goal status: INITIAL   5.  Pt will increase FOTO to at least 57% to demonstrate improvements in functional mobility. Baseline: 33% Goal status: INITIAL         PLAN:   PT FREQUENCY: 2x/week   PT DURATION: 8 weeks   PLANNED INTERVENTIONS: Therapeutic exercises, Therapeutic activity, Neuromuscular re-education, Balance training, Gait training, Patient/Family education, Self Care, Joint mobilization, Joint manipulation, Stair training, Vestibular training, Canalith repositioning, Aquatic Therapy, Dry Needling, Electrical stimulation, Spinal manipulation, Spinal mobilization, Cryotherapy, Moist heat, Taping, Traction, Ultrasound, Ionotophoresis 36m/ml Dexamethasone, Manual therapy, and Re-evaluation   PLAN FOR NEXT SESSION: Assess and progress HEP as indicated, strengthening, core strengthening, flexibility    JMyrene Galas PTA 01/29/22 12:20 PM   [facility] [address] SRichlandGRetreat [place] [address]Phone # [phone] [phone]

## 2022-01-30 ENCOUNTER — Ambulatory Visit (INDEPENDENT_AMBULATORY_CARE_PROVIDER_SITE_OTHER): Payer: No Typology Code available for payment source | Admitting: Family Medicine

## 2022-01-30 ENCOUNTER — Encounter: Payer: Self-pay | Admitting: Family Medicine

## 2022-01-30 VITALS — BP 124/70 | HR 75 | Temp 97.9°F | Ht 60.0 in | Wt 178.9 lb

## 2022-01-30 DIAGNOSIS — I10 Essential (primary) hypertension: Secondary | ICD-10-CM | POA: Diagnosis not present

## 2022-01-30 MED ORDER — AMLODIPINE BESYLATE 10 MG PO TABS: 10.0000 mg | ORAL_TABLET | Freq: Every day | ORAL | 1 refills | Status: DC

## 2022-01-30 NOTE — Assessment & Plan Note (Signed)
BP is now well controlled on the 10 mg amlodipine daily. She is tolerating it well without side effects. We will continue this medication as prescribed. I reviewed her lipid panel with her and her 10 year CVD risk score. Pt states that someone called from her insurance company and told her she needed a statin medication, I advised that since there is some question of this (HDL is 79 and her LDL is already <100 without medication), I advised that we could get a CT calcium score to determine if a statin is needed for CVD prevention. Pt is agreeable to this. Have placed order.

## 2022-01-30 NOTE — Progress Notes (Signed)
Established Patient Office Visit  Subjective   Patient ID: Colleen Coffey, female    DOB: [birth year]  Age: 71 y.o. MRN: [id number]  Chief Complaint  Patient presents with   Follow-up    Patient is here for follow up of her HTN.. Pt reports that she is getting normal readings at home, had an occasional BP in the 140's, most are in the 120's. Denies any side effects to the medication, denies dizziness or lightheaded ness. BP performed in office and is in the normal range today.   Chronic low back pain-- pt is going to physical therapy for this, states that it really depend on her activity level, states that if she sits most of the day she doesn't have as much pain. States that the diclofenac is "ok" doesn't always help to manage her pain.    Current Outpatient Medications  Medication Instructions   acetaminophen (TYLENOL) 650 mg, Oral, Every 6 hours PRN   amLODipine (NORVASC) 10 mg, Oral, Daily   Cholecalciferol (VITAMIN D3 PO) 1,000 Units, Oral, Daily   diclofenac (VOLTAREN) 75 mg, Oral, 2 times daily   Magnesium Hydroxide (DULCOLAX PO) Oral   meclizine (ANTIVERT) 6.25-12.5 mg, Oral, 3 times daily PRN   METAMUCIL FIBER PO 1 Dose, Oral, Daily   polyethylene glycol (MIRALAX) 17 g, Oral, Daily    Patient Active Problem List   Diagnosis Date Noted   Muscle pain 10/19/2021   Prediabetes 10/19/2021   Hypertension 04/27/2020   Osteoarthritis, knee 06/27/2015   BMI 28.0-28.9,adult 06/27/2015   Constipation 06/27/2015   Osteopenia 06/27/2015      Review of Systems  All other systems reviewed and are negative.     Objective:     BP 124/70 (BP Location: Left Arm, Patient Position: Sitting, Cuff Size: Large)   Pulse 75   Temp 97.9 F (36.6 C) (Oral)   Ht 5' (1.524 m)   Wt 178 lb 14.4 oz (81.1 kg)   LMP 01/11/2017 (Exact Date)   SpO2 100%   BMI 34.94 kg/m  BP Readings from Last 3 Encounters:  01/30/22 124/70  12/05/21 (!) 148/70  12/05/21 (!) 148/70       Physical Exam Vitals reviewed.  Constitutional:      Appearance: Normal appearance. She is well-groomed. She is obese.  Neck:     Thyroid: No thyromegaly.  Cardiovascular:     Rate and Rhythm: Normal rate and regular rhythm.     Pulses: Normal pulses.     Heart sounds: S1 normal and S2 normal.  Pulmonary:     Effort: Pulmonary effort is normal.     Breath sounds: Normal breath sounds and air entry.  Musculoskeletal:     Right lower leg: No edema.     Left lower leg: No edema.  Neurological:     Mental Status: She is alert and oriented to person, place, and time. Mental status is at baseline.     Gait: Gait is intact.  Psychiatric:        Mood and Affect: Mood and affect normal.        Speech: Speech normal.        Behavior: Behavior normal.        Judgment: Judgment normal.      No results found for any visits on 01/30/22.    The 10-year ASCVD risk score (Arnett DK, et al., 2019) is: 13%    Assessment & Plan:   Problem List Items Addressed This Visit  Unprioritized   Hypertension - Primary    BP is now well controlled on the 10 mg amlodipine daily. She is tolerating it well without side effects. We will continue this medication as prescribed. I reviewed her lipid panel with her and her 10 year CVD risk score. Pt states that someone called from her insurance company and told her she needed a statin medication, I advised that since there is some question of this (HDL is 79 and her LDL is already <100 without medication), I advised that we could get a CT calcium score to determine if a statin is needed for CVD prevention. Pt is agreeable to this. Have placed order.      Relevant Medications   amLODipine (NORVASC) 10 MG tablet   Other Relevant Orders   CT CARDIAC SCORING (SELF PAY ONLY)    No follow-ups on file.    Farrel Conners, MD

## 2022-02-02 ENCOUNTER — Ambulatory Visit: Payer: No Typology Code available for payment source | Admitting: Rehabilitative and Restorative Service Providers"

## 2022-02-06 ENCOUNTER — Ambulatory Visit: Payer: No Typology Code available for payment source | Admitting: Rehabilitative and Restorative Service Providers"

## 2022-02-06 ENCOUNTER — Encounter: Payer: Self-pay | Admitting: Rehabilitative and Restorative Service Providers"

## 2022-02-06 DIAGNOSIS — R262 Difficulty in walking, not elsewhere classified: Secondary | ICD-10-CM

## 2022-02-06 DIAGNOSIS — M6281 Muscle weakness (generalized): Secondary | ICD-10-CM

## 2022-02-06 DIAGNOSIS — M5459 Other low back pain: Secondary | ICD-10-CM | POA: Diagnosis not present

## 2022-02-06 DIAGNOSIS — R2689 Other abnormalities of gait and mobility: Secondary | ICD-10-CM

## 2022-02-06 DIAGNOSIS — M25551 Pain in right hip: Secondary | ICD-10-CM

## 2022-02-06 NOTE — Patient Instructions (Signed)

## 2022-02-06 NOTE — Therapy (Signed)
OUTPATIENT PHYSICAL THERAPY TREATMENT NOTE   Patient Name: Colleen Coffey MRN: [id number] DOB:Sep 12, 1950, 71 y.o., female Today's Date: 02/06/2022  PCP: Farrel Conners, MD  REFERRING PROVIDER: Farrel Conners, MD    Progress Note Reporting Period 12/27/2021 to 02/06/2022  See note below for Objective Data and Assessment of Progress/Goals.       END OF SESSION:   PT End of Session - 02/06/22 0932     Visit Number 9    Date for PT Re-Evaluation 02/21/22    Authorization Type [employer]    Progress Note Due on Visit 19    PT Start Time 0930    PT Stop Time 1010    PT Time Calculation (min) 40 min    Activity Tolerance Patient tolerated treatment well    Behavior During Therapy WFL for tasks assessed/performed                Past Medical History:  Diagnosis Date   Constipation    chronic, improved with healthy diet   Endocervical polyp    GERD (gastroesophageal reflux disease)    Hemorrhoids    resolved   Hypertension    Osteoarthritis, knee 06/27/2015   -saw [facility]    Osteopenia 06/27/2015   -reports on medication remotely with gyn    Past Surgical History:  Procedure Laterality Date   APPENDECTOMY     BACK SURGERY  2014   bulging disc lumbar spine   BREAST CYST ASPIRATION Right    CESAREAN SECTION     2   CHOLECYSTECTOMY N/A 11/16/2020   Procedure: LAPAROSCOPIC CHOLECYSTECTOMY;  Surgeon: Jovita Kussmaul, MD;  Location: [place];  Service: General;  Laterality: N/A;   Patient Active Problem List   Diagnosis Date Noted   Muscle pain 10/19/2021   Prediabetes 10/19/2021   Hypertension 04/27/2020   Osteoarthritis, knee 06/27/2015   BMI 28.0-28.9,adult 06/27/2015   Constipation 06/27/2015   Osteopenia 06/27/2015    REFERRING DIAG: M25.551,M25.552 (ICD-10-CM) - Bilateral hip pain   THERAPY DIAG:  Other low back pain  Difficulty in walking, not elsewhere classified  Muscle weakness (generalized)  Bilateral hip  pain  Other abnormalities of gait and mobility  Rationale for Evaluation and Treatment Rehabilitation  PERTINENT HISTORY: HTN, OA, Osteopenia   PRECAUTIONS: None  SUBJECTIVE:                                                                                                                                                                                      SUBJECTIVE STATEMENT: Pt reports that she is overall feeling approximately 50% better since starting skilled PT.  PAIN:  Are you  having pain? YES.  Right leg/thigh pain of 5/10.   OBJECTIVE: (objective measures completed at initial evaluation unless otherwise dated)   DIAGNOSTIC FINDINGS:  11/13/21 Bilat hip radiograph: IMPRESSION: 1. No acute findings. No significant degenerative change at either hip joint. 2. Mild degenerative sclerosis at the symphysis pubis.   PATIENT SURVEYS:  Eval:  FOTO 33% (projected 57% by visit 16) 02/06/2022:  49%   COGNITION: Overall cognitive status: Within functional limits for tasks assessed                         SENSATION: Reports sometimes feeling numbness in her legs     MUSCLE LENGTH: Hamstrings: tightness noted bilaterally   POSTURE: rounded shoulders and forward head   PALPATION: Tenderness to palpation at bilateral hips, spasms noted along lumbar multifidi    LOWER EXTREMITY ROM:   WFL   LOWER EXTREMITY MMT:   12/27/2021:  BLE strength grossly 4/5 throughout   LOWER EXTREMITY SPECIAL TESTS:  Hip special tests: SI compression test: positive    FUNCTIONAL TESTS:  Eval:  5 times sit to stand: 21.3 seconds 02/06/2022:  5 times sit to stand: 13.3 seconds   GAIT: Distance walked: >100 ft Assistive device utilized: None Level of assistance: Complete Independence Comments: Pt with antalgic gait noted     TODAY'S TREATMENT:   02/06/2022: Nustep new model Level 5 x 6 min with PT present to discuss current status Supine hamstring with strap 4 breaths 5x with strap  both Supine bridge 2x10 Supine red tband clamshell 2x10 VC for small pelvic tilt Supine posterior pelvic tilt with marching with red tband around knees 2x10 bilat Supine lower trunk rotation x10 bilat with cuing to hold for 2 breaths Childs pose 3 breaths Supine hip extension 2x10 bilat Provided handout about dry needling   01/29/22: Nustep new model L3x 44mn PTA present to discuss current status Body mechanics education for ADLS that are lumbar protective Supine red band clamshell 2x10 VC for small pelvic tilt Supine hamstring with strap 4 breaths 5x with strap both Supine IT band stretch 5 x 20 sec Supine bridge 2x10 Supine dying bug UE holding 2# wts 10x2  Supine SKC with emphasis on straight leg stretching long Bil 2x 3 breaths Prone hip ext 10x Bil with pillow under pelvis CArdine Engpose 3 breaths Standing Post pelvic tilt against wall 10x  01/26/22: Nustep old model L3 x 8 min PT present to discuss current status Seated with 3#:  heel/toe raises, marching, LAQ  BLE 2x10 Supine clamshells yellow band 2x10 Supine hamstring with strap 4 breaths 5x with strap both Supine IT band stretch 5 x 20 sec  Manual Therapy in prone:  Addaday to lumbar paraspinals, glutes/piriformis, IT band/TFL, and hamstrings with gentle pressure applied to areas of trigger points.    PATIENT EDUCATION:  Education details: Issued HEP.  On 02/06/2022, educated pt about dry needling treatment. Person educated: Patient Education method: Explanation, Demonstration, and Handouts Education comprehension: verbalized understanding and returned demonstration   HOME EXERCISE PROGRAM: Access Code: [email or web address]: [email or web address] Date: 12/27/2021 Prepared by: SShelby DubinMenke   Exercises - Seated Piriformis Stretch  - 1 x daily - 7 x weekly - 1 sets - 2 reps - 20 sec hold - Seated Hamstring Stretch  - 1 x daily - 7 x weekly - 1 sets - 2 reps - 20 sec hold - Seated Long Arc Quad  - 1 x daily -  7 x weekly -  2 sets - 10 reps - Seated March  - 1 x daily - 7 x weekly - 2 sets - 10 reps - Seated Hip Adduction Isometrics with Ball  - 1 x daily - 7 x weekly - 2 sets - 10 reps - Supine Posterior Pelvic Tilt  - 1 x daily - 7 x weekly - 2 sets - 10 reps - Bent Knee Fallouts  - 1 x daily - 7 x weekly - 2 sets - 10 reps   ASSESSMENT:   CLINICAL IMPRESSION: Pt reports at least 50% improvement since initial evaluation.  States that she is able to ambulate for at least 10 minutes before she starts having increased pain.  Patient has increased on FOTO score since initial evaluation and has met short term goal for 5 times sit to/from stand.  Patient is progressing overall with improved strength and improved core stability.  Patient continues to report relief of pain with stretching.  Patient continues to require skilled PT to progress towards goal related activities.    OBJECTIVE IMPAIRMENTS: decreased balance, difficulty walking, decreased strength, increased muscle spasms, impaired flexibility, postural dysfunction, and pain.    ACTIVITY LIMITATIONS: standing, squatting, and transfers   PARTICIPATION LIMITATIONS: cleaning and community activity   PERSONAL FACTORS: Time since onset of injury/illness/exacerbation and 3+ comorbidities: OA, osteopenia, Hx of lumbar surgery  are also affecting patient's functional outcome.    REHAB POTENTIAL: Good   CLINICAL DECISION MAKING: Evolving/moderate complexity   EVALUATION COMPLEXITY: Moderate     GOALS: Goals reviewed with patient? No   SHORT TERM GOALS: Target date: 01/17/2022  Pt will be independent with initial HEP. Baseline: Goal status: Goal met on12/1/23  2.  Pt will improve 5 times sit to//from stand to no greater than 15 seconds /or less to demonstrate improved functional strength. Baseline:  Goal status: MET on 02/06/2022     LONG TERM GOALS: Target date: 02/21/2022    Pt will be independent with advanced HEP. Baseline:  Goal  status: Ongoing   2.  Pt will increase BLE strength to at least 4+/5 to allow him to improve her ability to navigate stairs. Baseline: 4/5 Goal status: IN PROGRESS   3.  Pt will report ability to ambulate for at least 20 minutes without increased pain. Baseline:  Goal status: IN PROGRESS   4.  Pt will report ability to return to working out in the gym without increased pain. Baseline:  Goal status: IN PROGRESS   5.  Pt will increase FOTO to at least 57% to demonstrate improvements in functional mobility. Baseline: 33% Goal status: IN PROGRESS (see above)         PLAN:   PT FREQUENCY: 2x/week   PT DURATION: 8 weeks   PLANNED INTERVENTIONS: Therapeutic exercises, Therapeutic activity, Neuromuscular re-education, Balance training, Gait training, Patient/Family education, Self Care, Joint mobilization, Joint manipulation, Stair training, Vestibular training, Canalith repositioning, Aquatic Therapy, Dry Needling, Electrical stimulation, Spinal manipulation, Spinal mobilization, Cryotherapy, Moist heat, Taping, Traction, Ultrasound, Ionotophoresis 40m/ml Dexamethasone, Manual therapy, and Re-evaluation   PLAN FOR NEXT SESSION: Assess and progress HEP as indicated, strengthening, core strengthening, flexibility    SJuel Burrow PT 02/06/22 10:19 AM  [facility] [address] SMichigan CenterGSprings [place] [address]Phone # [phone] [phone]

## 2022-02-09 ENCOUNTER — Encounter: Payer: Self-pay | Admitting: Physical Therapy

## 2022-02-09 ENCOUNTER — Ambulatory Visit (HOSPITAL_COMMUNITY)
Admission: RE | Admit: 2022-02-09 | Discharge: 2022-02-09 | Disposition: A | Discharge status: Home / Self Care | Payer: No Typology Code available for payment source | Source: Ambulatory Visit | Attending: Family Medicine | Admitting: Family Medicine

## 2022-02-09 ENCOUNTER — Ambulatory Visit: Payer: No Typology Code available for payment source | Admitting: Physical Therapy

## 2022-02-09 DIAGNOSIS — M5459 Other low back pain: Secondary | ICD-10-CM

## 2022-02-09 DIAGNOSIS — M6281 Muscle weakness (generalized): Secondary | ICD-10-CM

## 2022-02-09 DIAGNOSIS — R293 Abnormal posture: Secondary | ICD-10-CM

## 2022-02-09 DIAGNOSIS — I10 Essential (primary) hypertension: Secondary | ICD-10-CM | POA: Insufficient documentation

## 2022-02-09 DIAGNOSIS — R2689 Other abnormalities of gait and mobility: Secondary | ICD-10-CM

## 2022-02-09 DIAGNOSIS — R252 Cramp and spasm: Secondary | ICD-10-CM

## 2022-02-09 DIAGNOSIS — R262 Difficulty in walking, not elsewhere classified: Secondary | ICD-10-CM

## 2022-02-09 DIAGNOSIS — M25551 Pain in right hip: Secondary | ICD-10-CM

## 2022-02-09 NOTE — Therapy (Signed)
OUTPATIENT PHYSICAL THERAPY TREATMENT NOTE   Patient Name: Colleen Coffey MRN: [id number] DOB:[birth year], 71 y.o., female Today's Date: 02/09/2022  PCP: Farrel Conners, MD  REFERRING PROVIDER: Farrel Conners, MD      END OF SESSION:   PT End of Session - 02/09/22 1014     Visit Number 10    Date for PT Re-Evaluation 02/21/22    Authorization Type [employer]    Progress Note Due on Visit 19    PT Start Time 1014    PT Stop Time 1052    PT Time Calculation (min) 38 min    Activity Tolerance Patient tolerated treatment well    Behavior During Therapy WFL for tasks assessed/performed                Past Medical History:  Diagnosis Date   Constipation    chronic, improved with healthy diet   Endocervical polyp    GERD (gastroesophageal reflux disease)    Hemorrhoids    resolved   Hypertension    Osteoarthritis, knee 06/27/2015   -saw [facility]    Osteopenia 06/27/2015   -reports on medication remotely with gyn    Past Surgical History:  Procedure Laterality Date   APPENDECTOMY     BACK SURGERY  2014   bulging disc lumbar spine   BREAST CYST ASPIRATION Right    CESAREAN SECTION     2   CHOLECYSTECTOMY N/A 11/16/2020   Procedure: LAPAROSCOPIC CHOLECYSTECTOMY;  Surgeon: Jovita Kussmaul, MD;  Location: [facility];  Service: General;  Laterality: N/A;   Patient Active Problem List   Diagnosis Date Noted   Muscle pain 10/19/2021   Prediabetes 10/19/2021   Hypertension 04/27/2020   Osteoarthritis, knee 06/27/2015   BMI 28.0-28.9,adult 06/27/2015   Constipation 06/27/2015   Osteopenia 06/27/2015    REFERRING DIAG: M25.551,M25.552 (ICD-10-CM) - Bilateral hip pain   THERAPY DIAG:  Other low back pain  Difficulty in walking, not elsewhere classified  Muscle weakness (generalized)  Bilateral hip pain  Other abnormalities of gait and mobility  Cramp and spasm  Abnormal posture  Rationale for Evaluation and Treatment  Rehabilitation  PERTINENT HISTORY: HTN, OA, Osteopenia   PRECAUTIONS: None  SUBJECTIVE:                                                                                                                                                                                      SUBJECTIVE STATEMENT: Doing ok, good week.  PAIN:  Are you having pain? YES. Low back pain of 2/10.   OBJECTIVE: (objective measures completed at initial evaluation unless otherwise dated)   DIAGNOSTIC FINDINGS:  11/13/21 Bilat hip radiograph: IMPRESSION: 1. No acute findings. No significant degenerative change at either hip joint. 2. Mild degenerative sclerosis at the symphysis pubis.   PATIENT SURVEYS:  Eval:  FOTO 33% (projected 57% by visit 16) 02/06/2022:  49%   COGNITION: Overall cognitive status: Within functional limits for tasks assessed                         SENSATION: Reports sometimes feeling numbness in her legs     MUSCLE LENGTH: Hamstrings: tightness noted bilaterally   POSTURE: rounded shoulders and forward head   PALPATION: Tenderness to palpation at bilateral hips, spasms noted along lumbar multifidi    LOWER EXTREMITY ROM:   WFL   LOWER EXTREMITY MMT:   12/27/2021:  BLE strength grossly 4/5 throughout   LOWER EXTREMITY SPECIAL TESTS:  Hip special tests: SI compression test: positive    FUNCTIONAL TESTS:  Eval:  5 times sit to stand: 21.3 seconds 02/06/2022:  5 times sit to stand: 13.3 seconds   GAIT: Distance walked: >100 ft Assistive device utilized: None Level of assistance: Complete Independence Comments: Pt with antalgic gait noted     TODAY'S TREATMENT:    02/09/22:  Nustep old model Level 4 x 7 min with PTA present to discuss current status Supine red loop clamshell 2x15 VC for small pelvic tilt Supine hamstring with strap 4 breaths 3x with strap both Supine IT band AROM 10xBil Supine bridge 2x10 Supine dying bug UE holding 2# wts 10x2 Supine SKC with  emphasis on straight leg stretching long Bil 3x 3 breaths Prone hip ext 10x2 Bil with pillow under pelvis Seated yellow Tband pulls for multifidus contraction 3 sec hold 5x Sit to stand holding 5# KB 2x10 Forward step ups Bil 10x light UE support  02/06/2022: Nustep new model Level 5 x 6 min with PT present to discuss current status Supine hamstring with strap 4 breaths 5x with strap both Supine bridge 2x10 Supine red tband clamshell 2x10 VC for small pelvic tilt Supine posterior pelvic tilt with marching with red tband around knees 2x10 bilat Supine lower trunk rotation x10 bilat with cuing to hold for 2 breaths Ardine Eng pose 3 breaths Supine hip extension 2x10 bilat Provided handout about dry needling   01/29/22: Nustep new model L3x 90mn PTA present to discuss current status Body mechanics education for ADLS that are lumbar protective Supine red band clamshell 2x10 VC for small pelvic tilt Supine hamstring with strap 4 breaths 5x with strap both Supine IT band stretch 5 x 20 sec Supine bridge 2x10 Supine dying bug UE holding 2# wts 10x2  Supine SKC with emphasis on straight leg stretching long Bil 2x 3 breaths Prone hip ext 10x Bil with pillow under pelvis Childs pose 3 breaths Standing Post pelvic tilt against wall 10x    PATIENT EDUCATION:  Education details: Issued HEP.  On 02/06/2022, educated pt about dry needling treatment. Person educated: Patient Education method: Explanation, Demonstration, and Handouts Education comprehension: verbalized understanding and returned demonstration   HOME EXERCISE PROGRAM: Access Code: [email or web address]: [email or web address] Date: 12/27/2021 Prepared by: SShelby DubinMenke   Exercises - Seated Piriformis Stretch  - 1 x daily - 7 x weekly - 1 sets - 2 reps - 20 sec hold - Seated Hamstring Stretch  - 1 x daily - 7 x weekly - 1 sets - 2 reps - 20 sec hold - Seated Long Arc Quad  - 1 x daily - 7  x weekly - 2 sets - 10 reps -  Seated March  - 1 x daily - 7 x weekly - 2 sets - 10 reps - Seated Hip Adduction Isometrics with Ball  - 1 x daily - 7 x weekly - 2 sets - 10 reps - Supine Posterior Pelvic Tilt  - 1 x daily - 7 x weekly - 2 sets - 10 reps - Bent Knee Fallouts  - 1 x daily - 7 x weekly - 2 sets - 10 reps   ASSESSMENT:   CLINICAL IMPRESSION: Pt arrives with no back or hip pain today. Continues to have good weeks and random bad or "sore" days. Pt doesn't love doing strengthening exercises but reports she does see the importance of being stronger. Today we added a few standing exercises. Light crepitus in knees but no pain reported.     OBJECTIVE IMPAIRMENTS: decreased balance, difficulty walking, decreased strength, increased muscle spasms, impaired flexibility, postural dysfunction, and pain.    ACTIVITY LIMITATIONS: standing, squatting, and transfers   PARTICIPATION LIMITATIONS: cleaning and community activity   PERSONAL FACTORS: Time since onset of injury/illness/exacerbation and 3+ comorbidities: OA, osteopenia, Hx of lumbar surgery  are also affecting patient's functional outcome.    REHAB POTENTIAL: Good   CLINICAL DECISION MAKING: Evolving/moderate complexity   EVALUATION COMPLEXITY: Moderate     GOALS: Goals reviewed with patient? No   SHORT TERM GOALS: Target date: 01/17/2022  Pt will be independent with initial HEP. Baseline: Goal status: Goal met on12/1/23  2.  Pt will improve 5 times sit to//from stand to no greater than 15 seconds /or less to demonstrate improved functional strength. Baseline:  Goal status: MET on 02/06/2022     LONG TERM GOALS: Target date: 02/21/2022    Pt will be independent with advanced HEP. Baseline:  Goal status: Ongoing   2.  Pt will increase BLE strength to at least 4+/5 to allow him to improve her ability to navigate stairs. Baseline: 4/5 Goal status: IN PROGRESS   3.  Pt will report ability to ambulate for at least 20 minutes without increased  pain. Baseline:  Goal status: IN PROGRESS   4.  Pt will report ability to return to working out in the gym without increased pain. Baseline:  Goal status: IN PROGRESS   5.  Pt will increase FOTO to at least 57% to demonstrate improvements in functional mobility. Baseline: 33% Goal status: IN PROGRESS (see above)         PLAN:   PT FREQUENCY: 2x/week   PT DURATION: 8 weeks   PLANNED INTERVENTIONS: Therapeutic exercises, Therapeutic activity, Neuromuscular re-education, Balance training, Gait training, Patient/Family education, Self Care, Joint mobilization, Joint manipulation, Stair training, Vestibular training, Canalith repositioning, Aquatic Therapy, Dry Needling, Electrical stimulation, Spinal manipulation, Spinal mobilization, Cryotherapy, Moist heat, Taping, Traction, Ultrasound, Ionotophoresis 21m/ml Dexamethasone, Manual therapy, and Re-evaluation   PLAN FOR NEXT SESSION: Assess and progress HEP as indicated, strengthening, core strengthening, flexibility    JMyrene Galas PTA 02/09/22 10:50 AM   [place] [place] [place]  [id number] # [phone] [phone]

## 2022-02-13 ENCOUNTER — Ambulatory Visit: Payer: No Typology Code available for payment source | Attending: Family Medicine

## 2022-02-13 ENCOUNTER — Ambulatory Visit
Admission: RE | Admit: 2022-02-13 | Discharge: 2022-02-13 | Disposition: A | Discharge status: Home / Self Care | Payer: No Typology Code available for payment source | Source: Ambulatory Visit | Attending: Family Medicine | Admitting: Family Medicine

## 2022-02-13 DIAGNOSIS — R293 Abnormal posture: Secondary | ICD-10-CM | POA: Diagnosis not present

## 2022-02-13 DIAGNOSIS — R262 Difficulty in walking, not elsewhere classified: Secondary | ICD-10-CM | POA: Diagnosis not present

## 2022-02-13 DIAGNOSIS — M25551 Pain in right hip: Secondary | ICD-10-CM | POA: Insufficient documentation

## 2022-02-13 DIAGNOSIS — M6281 Muscle weakness (generalized): Secondary | ICD-10-CM | POA: Diagnosis not present

## 2022-02-13 DIAGNOSIS — M5459 Other low back pain: Secondary | ICD-10-CM | POA: Diagnosis not present

## 2022-02-13 DIAGNOSIS — Z1231 Encounter for screening mammogram for malignant neoplasm of breast: Secondary | ICD-10-CM | POA: Diagnosis not present

## 2022-02-13 DIAGNOSIS — R2689 Other abnormalities of gait and mobility: Secondary | ICD-10-CM | POA: Insufficient documentation

## 2022-02-13 DIAGNOSIS — M25552 Pain in left hip: Secondary | ICD-10-CM | POA: Insufficient documentation

## 2022-02-13 DIAGNOSIS — R252 Cramp and spasm: Secondary | ICD-10-CM | POA: Diagnosis present

## 2022-02-13 NOTE — Therapy (Signed)
OUTPATIENT PHYSICAL THERAPY TREATMENT NOTE   Patient Name: Colleen Coffey MRN: [id number] DOB:02-24-1950, 72 y.o., female Today's Date: 02/13/2022  PCP: Farrel Conners, MD  REFERRING PROVIDER: Farrel Conners, MD      END OF SESSION:   PT End of Session - 02/13/22 1447     Visit Number 11    Date for PT Re-Evaluation 02/21/22    Authorization Type [employer]    PT Start Time 1445    PT Stop Time 1523    PT Time Calculation (min) 38 min    Activity Tolerance Patient tolerated treatment well    Behavior During Therapy [facility] for tasks assessed/performed                Past Medical History:  Diagnosis Date   Constipation    chronic, improved with healthy diet   Endocervical polyp    GERD (gastroesophageal reflux disease)    Hemorrhoids    resolved   Hypertension    Osteoarthritis, knee 06/27/2015   -saw [facility]    Osteopenia 06/27/2015   -reports on medication remotely with gyn    Past Surgical History:  Procedure Laterality Date   APPENDECTOMY     BACK SURGERY  2014   bulging disc lumbar spine   BREAST CYST ASPIRATION Right    CESAREAN SECTION     2   CHOLECYSTECTOMY N/A 11/16/2020   Procedure: LAPAROSCOPIC CHOLECYSTECTOMY;  Surgeon: Jovita Kussmaul, MD;  Location: [facility];  Service: General;  Laterality: N/A;   Patient Active Problem List   Diagnosis Date Noted   Muscle pain 10/19/2021   Prediabetes 10/19/2021   Hypertension 04/27/2020   Osteoarthritis, knee 06/27/2015   BMI 28.0-28.9,adult 06/27/2015   Constipation 06/27/2015   Osteopenia 06/27/2015    REFERRING DIAG: M25.551,M25.552 (ICD-10-CM) - Bilateral hip pain   THERAPY DIAG:  Other low back pain  Difficulty in walking, not elsewhere classified  Muscle weakness (generalized)  Bilateral hip pain  Cramp and spasm  Abnormal posture  Rationale for Evaluation and Treatment Rehabilitation  PERTINENT HISTORY: HTN, OA, Osteopenia   PRECAUTIONS:  None  SUBJECTIVE:                                                                                                                                                                                      SUBJECTIVE STATEMENT: Patient reports she is doing good.  She reports pain is minimal.    PAIN:  Are you having pain? YES. Low back pain of 2/10.   OBJECTIVE: (objective measures completed at initial evaluation unless otherwise dated)   DIAGNOSTIC FINDINGS:  11/13/21 Bilat hip radiograph: IMPRESSION: 1.  No acute findings. No significant degenerative change at either hip joint. 2. Mild degenerative sclerosis at the symphysis pubis.   PATIENT SURVEYS:  Eval:  FOTO 33% (projected 57% by visit 16) 02/06/2022:  49%   COGNITION: Overall cognitive status: Within functional limits for tasks assessed                         SENSATION: Reports sometimes feeling numbness in her legs     MUSCLE LENGTH: Hamstrings: tightness noted bilaterally   POSTURE: rounded shoulders and forward head   PALPATION: Tenderness to palpation at bilateral hips, spasms noted along lumbar multifidi    LOWER EXTREMITY ROM:   WFL   LOWER EXTREMITY MMT:   12/27/2021:  BLE strength grossly 4/5 throughout   LOWER EXTREMITY SPECIAL TESTS:  Hip special tests: SI compression test: positive    FUNCTIONAL TESTS:  Eval:  5 times sit to stand: 21.3 seconds 02/06/2022:  5 times sit to stand: 13.3 seconds   GAIT: Distance walked: >100 ft Assistive device utilized: None Level of assistance: Complete Independence Comments: Pt with antalgic gait noted     TODAY'S TREATMENT:   02/13/22:  Nustep old model Level 4 x 8 min with PT present to discuss current status Supine red loop clamshell 2x15 VC for small pelvic tilt Supine bridge with clam with red loop 2x10 Supine hamstring with strap 4 breaths 3x with strap both Supine IT band stretch 4 breaths 3 x each side with Supine dying bug UE holding 2# wts  10x2 Supine SKC with emphasis on straight leg stretching long Bil 3x 3 breaths Prone hip ext 10x2 Bil with pillow under pelvis Sit to stand holding 5# KB 2x10 Seated yellow Tband pulls for multifidus contraction 3 sec hold 5x Forward step ups Bil 10x light UE support Standing trunk rotation with yellow band with handles x 10 each side   02/09/22:  Nustep old model Level 4 x 7 min with PTA present to discuss current status Supine red loop clamshell 2x15 VC for small pelvic tilt Supine hamstring with strap 4 breaths 3x with strap both Supine IT band AROM 10xBil Supine bridge 2x10 Supine dying bug UE holding 2# wts 10x2 Supine SKC with emphasis on straight leg stretching long Bil 3x 3 breaths Prone hip ext 10x2 Bil with pillow under pelvis Seated yellow Tband pulls for multifidus contraction 3 sec hold 5x Sit to stand holding 5# KB 2x10 Forward step ups Bil 10x light UE support  02/06/2022: Nustep new model Level 5 x 6 min with PT present to discuss current status Supine hamstring with strap 4 breaths 5x with strap both Supine bridge 2x10 Supine red tband clamshell 2x10 VC for small pelvic tilt Supine posterior pelvic tilt with marching with red tband around knees 2x10 bilat Supine lower trunk rotation x10 bilat with cuing to hold for 2 breaths Childs pose 3 breaths Supine hip extension 2x10 bilat Provided handout about dry needling   PATIENT EDUCATION:  Education details: Issued HEP.  On 02/06/2022, educated pt about dry needling treatment. Person educated: Patient Education method: Explanation, Demonstration, and Handouts Education comprehension: verbalized understanding and returned demonstration   HOME EXERCISE PROGRAM: Access Code: [email or web address] URL: [email or web address] Date: 12/27/2021 Prepared by: Shelby Dubin Menke   Exercises - Seated Piriformis Stretch  - 1 x daily - 7 x weekly - 1 sets - 2 reps - 20 sec hold - Seated Hamstring Stretch  - 1 x daily - 7 x  weekly - 1 sets - 2 reps - 20 sec hold - Seated Long Arc Quad  - 1 x daily - 7 x weekly - 2 sets - 10 reps - Seated March  - 1 x daily - 7 x weekly - 2 sets - 10 reps - Seated Hip Adduction Isometrics with Ball  - 1 x daily - 7 x weekly - 2 sets - 10 reps - Supine Posterior Pelvic Tilt  - 1 x daily - 7 x weekly - 2 sets - 10 reps - Bent Knee Fallouts  - 1 x daily - 7 x weekly - 2 sets - 10 reps   ASSESSMENT:   CLINICAL IMPRESSION: Eleri is progressing appropriately.  She has very little pain to speak of.  She fatigues easily, however.  We added clam to bridging and added trunk rotation standing with yellow band.  She completed all tasks with good form and no increase in pain.     OBJECTIVE IMPAIRMENTS: decreased balance, difficulty walking, decreased strength, increased muscle spasms, impaired flexibility, postural dysfunction, and pain.    ACTIVITY LIMITATIONS: standing, squatting, and transfers   PARTICIPATION LIMITATIONS: cleaning and community activity   PERSONAL FACTORS: Time since onset of injury/illness/exacerbation and 3+ comorbidities: OA, osteopenia, Hx of lumbar surgery  are also affecting patient's functional outcome.    REHAB POTENTIAL: Good   CLINICAL DECISION MAKING: Evolving/moderate complexity   EVALUATION COMPLEXITY: Moderate     GOALS: Goals reviewed with patient? No   SHORT TERM GOALS: Target date: 01/17/2022  Pt will be independent with initial HEP. Baseline: Goal status: Goal met on12/1/23  2.  Pt will improve 5 times sit to//from stand to no greater than 15 seconds /or less to demonstrate improved functional strength. Baseline:  Goal status: MET on 02/06/2022     LONG TERM GOALS: Target date: 02/21/2022    Pt will be independent with advanced HEP. Baseline:  Goal status: Ongoing   2.  Pt will increase BLE strength to at least 4+/5 to allow him to improve her ability to navigate stairs. Baseline: 4/5 Goal status: IN PROGRESS   3.  Pt will report  ability to ambulate for at least 20 minutes without increased pain. Baseline:  Goal status: IN PROGRESS   4.  Pt will report ability to return to working out in the gym without increased pain. Baseline:  Goal status: IN PROGRESS   5.  Pt will increase FOTO to at least 57% to demonstrate improvements in functional mobility. Baseline: 33% Goal status: IN PROGRESS (see above)         PLAN:   PT FREQUENCY: 2x/week   PT DURATION: 8 weeks   PLANNED INTERVENTIONS: Therapeutic exercises, Therapeutic activity, Neuromuscular re-education, Balance training, Gait training, Patient/Family education, Self Care, Joint mobilization, Joint manipulation, Stair training, Vestibular training, Canalith repositioning, Aquatic Therapy, Dry Needling, Electrical stimulation, Spinal manipulation, Spinal mobilization, Cryotherapy, Moist heat, Taping, Traction, Ultrasound, Ionotophoresis 44m/ml Dexamethasone, Manual therapy, and Re-evaluation   PLAN FOR NEXT SESSION: Assess and progress HEP as indicated, strengthening, core strengthening, flexibility    Dynastie Knoop B. Elfida Shimada, PT 02/13/22 3:30 PM   [place] [place] [address]Phone # [phone] [phone]

## 2022-02-15 NOTE — Progress Notes (Signed)
Normal mammo, follow up in 1 year

## 2022-02-19 ENCOUNTER — Ambulatory Visit: Payer: No Typology Code available for payment source | Admitting: Rehabilitative and Restorative Service Providers"

## 2022-02-19 ENCOUNTER — Encounter: Payer: Self-pay | Admitting: Rehabilitative and Restorative Service Providers"

## 2022-02-19 DIAGNOSIS — M6281 Muscle weakness (generalized): Secondary | ICD-10-CM

## 2022-02-19 DIAGNOSIS — M5459 Other low back pain: Secondary | ICD-10-CM

## 2022-02-19 DIAGNOSIS — R262 Difficulty in walking, not elsewhere classified: Secondary | ICD-10-CM

## 2022-02-19 DIAGNOSIS — M25552 Pain in left hip: Secondary | ICD-10-CM

## 2022-02-19 DIAGNOSIS — R252 Cramp and spasm: Secondary | ICD-10-CM

## 2022-02-19 NOTE — Therapy (Signed)
OUTPATIENT PHYSICAL THERAPY TREATMENT NOTE   Patient Name: Colleen Coffey MRN: [id number] DOB:11-09-1950, 72 y.o., female Today's Date: 02/19/2022  PCP: Farrel Conners, MD  REFERRING PROVIDER: Farrel Conners, MD      END OF SESSION:   PT End of Session - 02/19/22 1123     Visit Number 12    Date for PT Re-Evaluation 02/21/22    Authorization Type [employer]    Progress Note Due on Visit 19    PT Start Time 1120   Pt arrived 20 min late for appointment   PT Stop Time 1145    PT Time Calculation (min) 25 min    Activity Tolerance Patient tolerated treatment well    Behavior During Therapy [facility] for tasks assessed/performed                Past Medical History:  Diagnosis Date   Constipation    chronic, improved with healthy diet   Endocervical polyp    GERD (gastroesophageal reflux disease)    Hemorrhoids    resolved   Hypertension    Osteoarthritis, knee 06/27/2015   -saw [facility]    Osteopenia 06/27/2015   -reports on medication remotely with gyn    Past Surgical History:  Procedure Laterality Date   APPENDECTOMY     BACK SURGERY  2014   bulging disc lumbar spine   BREAST CYST ASPIRATION Right    CESAREAN SECTION     2   CHOLECYSTECTOMY N/A 11/16/2020   Procedure: LAPAROSCOPIC CHOLECYSTECTOMY;  Surgeon: Jovita Kussmaul, MD;  Location: [facility];  Service: General;  Laterality: N/A;   Patient Active Problem List   Diagnosis Date Noted   Muscle pain 10/19/2021   Prediabetes 10/19/2021   Hypertension 04/27/2020   Osteoarthritis, knee 06/27/2015   BMI 28.0-28.9,adult 06/27/2015   Constipation 06/27/2015   Osteopenia 06/27/2015    REFERRING DIAG: M25.551,M25.552 (ICD-10-CM) - Bilateral hip pain   THERAPY DIAG:  Other low back pain  Difficulty in walking, not elsewhere classified  Muscle weakness (generalized)  Bilateral hip pain  Cramp and spasm  Rationale for Evaluation and Treatment Rehabilitation  PERTINENT HISTORY:  HTN, OA, Osteopenia   PRECAUTIONS: None  SUBJECTIVE:                                                                                                                                                                                      SUBJECTIVE STATEMENT: Patient reports that overall, she is feeling better, but has not yet been able to return to the gym.  PAIN:  Are you having pain? YES. Low back pain of 2/10.   OBJECTIVE: (objective  measures completed at initial evaluation unless otherwise dated)   DIAGNOSTIC FINDINGS:  11/13/21 Bilat hip radiograph: IMPRESSION: 1. No acute findings. No significant degenerative change at either hip joint. 2. Mild degenerative sclerosis at the symphysis pubis.   PATIENT SURVEYS:  Eval:  FOTO 33% (projected 57% by visit 16) 02/06/2022:  49% 02/19/2022:  63%   COGNITION: Overall cognitive status: Within functional limits for tasks assessed                         SENSATION: Reports sometimes feeling numbness in her legs     MUSCLE LENGTH: Hamstrings: tightness noted bilaterally   POSTURE: rounded shoulders and forward head   PALPATION: Tenderness to palpation at bilateral hips, spasms noted along lumbar multifidi    LOWER EXTREMITY ROM:   WFL   LOWER EXTREMITY MMT:   12/27/2021:  BLE strength grossly 4/5 throughout   LOWER EXTREMITY SPECIAL TESTS:  Hip special tests: SI compression test: positive    FUNCTIONAL TESTS:  Eval:  5 times sit to stand: 21.3 seconds 02/06/2022:  5 times sit to stand: 13.3 seconds   GAIT: Distance walked: >100 ft Assistive device utilized: None Level of assistance: Complete Independence Comments: Pt with antalgic gait noted     TODAY'S TREATMENT:   02/19/2022: Nustep level 5 x5 min with PT present to discuss status Supine hamstring with strap 4 breaths x2 with strap bilat Supine IT band stretch 4 breaths x2 with strap bilat Supine dying bug UE holding 2# wts 2x10 (with cuing for maintaining core  stability) Supine straight leg raise x10 bilat with cuing for slow/controlled motion Supine red loop clamshell 2x10 VC for small pelvic tilt   02/13/22: Nustep old model Level 4 x 8 min with PT present to discuss current status Supine red loop clamshell 2x15 VC for small pelvic tilt Supine bridge with clam with red loop 2x10 Supine hamstring with strap 4 breaths 3x with strap both Supine IT band stretch 4 breaths 3 x each side with Supine dying bug UE holding 2# wts 10x2 Supine SKC with emphasis on straight leg stretching long Bil 3x 3 breaths Prone hip ext 10x2 Bil with pillow under pelvis Sit to stand holding 5# KB 2x10 Seated yellow Tband pulls for multifidus contraction 3 sec hold 5x Forward step ups Bil 10x light UE support Standing trunk rotation with yellow band with handles x 10 each side   02/09/22: Nustep old model Level 4 x 7 min with PTA present to discuss current status Supine red loop clamshell 2x15 VC for small pelvic tilt Supine hamstring with strap 4 breaths 3x with strap both Supine IT band AROM 10xBil Supine bridge 2x10 Supine dying bug UE holding 2# wts 10x2 Supine SKC with emphasis on straight leg stretching long Bil 3x 3 breaths Prone hip ext 10x2 Bil with pillow under pelvis Seated yellow Tband pulls for multifidus contraction 3 sec hold 5x Sit to stand holding 5# KB 2x10 Forward step ups Bil 10x light UE support    PATIENT EDUCATION:  Education details: Issued HEP.  On 02/06/2022, educated pt about dry needling treatment. Person educated: Patient Education method: Explanation, Demonstration, and Handouts Education comprehension: verbalized understanding and returned demonstration   HOME EXERCISE PROGRAM: Access Code: [email or web address] URL: [email or web address] Date: 12/27/2021 Prepared by: Shelby Dubin Najae Filsaime   Exercises - Seated Piriformis Stretch  - 1 x daily - 7 x weekly - 1 sets - 2 reps - 20 sec hold - Seated Hamstring  Stretch  - 1 x daily - 7  x weekly - 1 sets - 2 reps - 20 sec hold - Seated Long Arc Quad  - 1 x daily - 7 x weekly - 2 sets - 10 reps - Seated March  - 1 x daily - 7 x weekly - 2 sets - 10 reps - Seated Hip Adduction Isometrics with Ball  - 1 x daily - 7 x weekly - 2 sets - 10 reps - Supine Posterior Pelvic Tilt  - 1 x daily - 7 x weekly - 2 sets - 10 reps - Bent Knee Fallouts  - 1 x daily - 7 x weekly - 2 sets - 10 reps   ASSESSMENT:   CLINICAL IMPRESSION:  Ms Gronau presents to skilled PT late to her appointment, stating that she thought her appointment was at 11:30.  Pt needed to leave by 11:45 to make it to another appointment.  Patient has met FOTO goal, however, has still not return to working out at the gym.  Patient states that she does at times still have some pain with exercises during PT.  Patient requires cuing for technique and proper breathing strategies during exercises.  Full reassessment to be completed next visit to determine discharge with patient returning to gym vs continued skilled PT for further guidance.      OBJECTIVE IMPAIRMENTS: decreased balance, difficulty walking, decreased strength, increased muscle spasms, impaired flexibility, postural dysfunction, and pain.    ACTIVITY LIMITATIONS: standing, squatting, and transfers   PARTICIPATION LIMITATIONS: cleaning and community activity   PERSONAL FACTORS: Time since onset of injury/illness/exacerbation and 3+ comorbidities: OA, osteopenia, Hx of lumbar surgery  are also affecting patient's functional outcome.    REHAB POTENTIAL: Good   CLINICAL DECISION MAKING: Evolving/moderate complexity   EVALUATION COMPLEXITY: Moderate     GOALS: Goals reviewed with patient? No   SHORT TERM GOALS: Target date: 01/17/2022  Pt will be independent with initial HEP. Baseline: Goal status: Goal met on12/1/23  2.  Pt will improve 5 times sit to//from stand to no greater than 15 seconds /or less to demonstrate improved functional strength. Baseline:   Goal status: MET on 02/06/2022     LONG TERM GOALS: Target date: 02/21/2022    Pt will be independent with advanced HEP. Baseline:  Goal status: Ongoing   2.  Pt will increase BLE strength to at least 4+/5 to allow him to improve her ability to navigate stairs. Baseline: 4/5 Goal status: IN PROGRESS   3.  Pt will report ability to ambulate for at least 20 minutes without increased pain. Baseline:  Goal status: IN PROGRESS   4.  Pt will report ability to return to working out in the gym without increased pain. Baseline:  Goal status: IN PROGRESS   5.  Pt will increase FOTO to at least 57% to demonstrate improvements in functional mobility. Baseline: 33% Goal status: MET on 02/19/2022         PLAN:   PT FREQUENCY: 2x/week   PT DURATION: 8 weeks   PLANNED INTERVENTIONS: Therapeutic exercises, Therapeutic activity, Neuromuscular re-education, Balance training, Gait training, Patient/Family education, Self Care, Joint mobilization, Joint manipulation, Stair training, Vestibular training, Canalith repositioning, Aquatic Therapy, Dry Needling, Electrical stimulation, Spinal manipulation, Spinal mobilization, Cryotherapy, Moist heat, Taping, Traction, Ultrasound, Ionotophoresis '4mg'$ /ml Dexamethasone, Manual therapy, and Re-evaluation   PLAN FOR NEXT SESSION: Reassessment for continued PT vs discharge    Kayslee Furey, PT 02/19/22 11:55 AM   [facility]  [address] [place],  [address] Phone # [phone] Fax [phone]

## 2022-02-21 ENCOUNTER — Encounter: Payer: Self-pay | Admitting: Rehabilitative and Restorative Service Providers"

## 2022-02-21 ENCOUNTER — Ambulatory Visit: Payer: No Typology Code available for payment source | Admitting: Rehabilitative and Restorative Service Providers"

## 2022-02-21 DIAGNOSIS — R262 Difficulty in walking, not elsewhere classified: Secondary | ICD-10-CM

## 2022-02-21 DIAGNOSIS — M5459 Other low back pain: Secondary | ICD-10-CM

## 2022-02-21 DIAGNOSIS — R2689 Other abnormalities of gait and mobility: Secondary | ICD-10-CM

## 2022-02-21 DIAGNOSIS — M25551 Pain in right hip: Secondary | ICD-10-CM

## 2022-02-21 DIAGNOSIS — M6281 Muscle weakness (generalized): Secondary | ICD-10-CM

## 2022-02-21 NOTE — Therapy (Signed)
OUTPATIENT PHYSICAL THERAPY TREATMENT NOTE   Patient Name: Colleen Coffey MRN: [id number] DOB:January 26, 1951, 72 y.o., female Today's Date: 02/21/2022  PCP: Farrel Conners, MD  REFERRING PROVIDER: Farrel Conners, MD      END OF SESSION:   PT End of Session - 02/21/22 1017     Visit Number 13    Date for PT Re-Evaluation 02/21/22    Authorization Type [employer]    Progress Note Due on Visit 19    PT Start Time 1015    PT Stop Time 1055    PT Time Calculation (min) 40 min    Activity Tolerance Patient tolerated treatment well    Behavior During Therapy [facility] for tasks assessed/performed                Past Medical History:  Diagnosis Date   Constipation    chronic, improved with healthy diet   Endocervical polyp    GERD (gastroesophageal reflux disease)    Hemorrhoids    resolved   Hypertension    Osteoarthritis, knee 06/27/2015   -saw [facility]    Osteopenia 06/27/2015   -reports on medication remotely with gyn    Past Surgical History:  Procedure Laterality Date   APPENDECTOMY     BACK SURGERY  2014   bulging disc lumbar spine   BREAST CYST ASPIRATION Right    CESAREAN SECTION     2   CHOLECYSTECTOMY N/A 11/16/2020   Procedure: LAPAROSCOPIC CHOLECYSTECTOMY;  Surgeon: Jovita Kussmaul, MD;  Location: [place];  Service: General;  Laterality: N/A;   Patient Active Problem List   Diagnosis Date Noted   Muscle pain 10/19/2021   Prediabetes 10/19/2021   Hypertension 04/27/2020   Osteoarthritis, knee 06/27/2015   BMI 28.0-28.9,adult 06/27/2015   Constipation 06/27/2015   Osteopenia 06/27/2015    REFERRING DIAG: M25.551,M25.552 (ICD-10-CM) - Bilateral hip pain   THERAPY DIAG:  Other low back pain  Difficulty in walking, not elsewhere classified  Muscle weakness (generalized)  Bilateral hip pain  Other abnormalities of gait and mobility  Rationale for Evaluation and Treatment Rehabilitation  PERTINENT HISTORY: HTN, OA,  Osteopenia   PRECAUTIONS: None  SUBJECTIVE:                                                                                                                                                                                      SUBJECTIVE STATEMENT: Patient reports that she is planning to start working out at the gym again next week.  Is ready for last visit with PT today.  PAIN:  Are you having pain? YES. Low back pain of 1/10.   OBJECTIVE: (  objective measures completed at initial evaluation unless otherwise dated)   DIAGNOSTIC FINDINGS:  11/13/21 Bilat hip radiograph: IMPRESSION: 1. No acute findings. No significant degenerative change at either hip joint. 2. Mild degenerative sclerosis at the symphysis pubis.   PATIENT SURVEYS:  Eval:  FOTO 33% (projected 57% by visit 16) 02/06/2022:  49% 02/19/2022:  63%   COGNITION: Overall cognitive status: Within functional limits for tasks assessed                         SENSATION: Reports sometimes feeling numbness in her legs     MUSCLE LENGTH: Hamstrings: tightness noted bilaterally   POSTURE: rounded shoulders and forward head   PALPATION: Tenderness to palpation at bilateral hips, spasms noted along lumbar multifidi    LOWER EXTREMITY ROM:   WFL   LOWER EXTREMITY MMT:   12/27/2021:  BLE strength grossly 4/5 throughout  02/21/2022:  BLE strength is grossly 4+ to 5/5 throughout   LOWER EXTREMITY SPECIAL TESTS:  Eval: Hip special tests: SI compression test: positive   02/21/2022: Hip special tests: SI compression test: negative   FUNCTIONAL TESTS:  Eval:  5 times sit to stand: 21.3 seconds 02/06/2022:  5 times sit to stand: 13.3 seconds 02/21/2022:  5 times sit to stand:  8.7 seconds   GAIT: Distance walked: >100 ft Assistive device utilized: None Level of assistance: Complete Independence Comments: Pt with antalgic gait noted     TODAY'S TREATMENT:   02/21/2022: Nustep level 5 x6 min with PT present to  discuss status Lat Pull 40# 2x10 Seated row 25# 2x10 Leg Press (seat at 5) 80# 2x10 Hip Matrix 40# for abduction and extension x10 bilat each Seated piriformis stretch 2x20 sec bilat Seated on green physioball:  4 way pelvic tilts, marching, LAQ.  2x10 each Standing hamstring stretch at stairs 2x20 sec bilat   02/19/2022: Nustep level 5 x5 min with PT present to discuss status Supine hamstring with strap 4 breaths x2 with strap bilat Supine IT band stretch 4 breaths x2 with strap bilat Supine dying bug UE holding 2# wts 2x10 (with cuing for maintaining core stability) Supine straight leg raise x10 bilat with cuing for slow/controlled motion Supine red loop clamshell 2x10 VC for small pelvic tilt   02/13/22: Nustep old model Level 4 x 8 min with PT present to discuss current status Supine red loop clamshell 2x15 VC for small pelvic tilt Supine bridge with clam with red loop 2x10 Supine hamstring with strap 4 breaths 3x with strap both Supine IT band stretch 4 breaths 3 x each side with Supine dying bug UE holding 2# wts 10x2 Supine SKC with emphasis on straight leg stretching long Bil 3x 3 breaths Prone hip ext 10x2 Bil with pillow under pelvis Sit to stand holding 5# KB 2x10 Seated yellow Tband pulls for multifidus contraction 3 sec hold 5x Forward step ups Bil 10x light UE support Standing trunk rotation with yellow band with handles x 10 each side      PATIENT EDUCATION:  Education details: Issued HEP.  On 02/06/2022, educated pt about dry needling treatment. Person educated: Patient Education method: Explanation, Demonstration, and Handouts Education comprehension: verbalized understanding and returned demonstration   HOME EXERCISE PROGRAM: Access Code: [email or web address] URL: [email or web address] Date: 02/21/2022 Prepared by: Shelby Dubin Sandee Bernath  Exercises - Seated Piriformis Stretch  - 1 x daily - 7 x weekly - 1 sets - 2 reps - 20 sec hold - Seated Hamstring Stretch   -  1 x daily - 7 x weekly - 1 sets - 2 reps - 20 sec hold - Seated Long Arc Quad  - 1 x daily - 7 x weekly - 2 sets - 10 reps - Seated March  - 1 x daily - 7 x weekly - 2 sets - 10 reps - Seated Hip Adduction Isometrics with Ball  - 1 x daily - 7 x weekly - 2 sets - 10 reps - Supine Posterior Pelvic Tilt  - 1 x daily - 7 x weekly - 2 sets - 10 reps - Bent Knee Fallouts  - 1 x daily - 7 x weekly - 2 sets - 10 reps - Supine Piriformis Stretch with Foot on Ground  - 1 x daily - 7 x weekly - 2 sets - 3 reps - 10 hold - Swiss Ball March  - 1 x daily - 7 x weekly - 2 sets - 10 reps - Swiss Ball Knee Extension  - 1 x daily - 7 x weekly - 2 sets - 10 reps - Pelvic Circles on Swiss Ball  - 1 x daily - 7 x weekly - 2 sets - 10 reps - Standing Hamstring Stretch on Chair  - 1 x daily - 7 x weekly - 1 sets - 2 reps - 20 sec hold   ASSESSMENT:   CLINICAL IMPRESSION:  Ms Vandekamp presents to skilled PT stating that she is having 1/10 pain and ready for discharge from PT to continue exercises independently and begin going to gym again.  Patient able to progress through PT session utilizing weight machines and other equipment found at her gym without increased pain.  Patient provided with updated handouts for exercises and able to demonstrate exercises correctly.  Patient is ready for discharge from skilled PT at this time to continue with HEP and exercises at the gym.    OBJECTIVE IMPAIRMENTS: decreased balance, difficulty walking, decreased strength, increased muscle spasms, impaired flexibility, postural dysfunction, and pain.    ACTIVITY LIMITATIONS: standing, squatting, and transfers   PARTICIPATION LIMITATIONS: cleaning and community activity   PERSONAL FACTORS: Time since onset of injury/illness/exacerbation and 3+ comorbidities: OA, osteopenia, Hx of lumbar surgery  are also affecting patient's functional outcome.    REHAB POTENTIAL: Good   CLINICAL DECISION MAKING: Evolving/moderate complexity    EVALUATION COMPLEXITY: Moderate     GOALS: Goals reviewed with patient? No   SHORT TERM GOALS: Target date: 01/17/2022  Pt will be independent with initial HEP. Baseline: Goal status: Goal met on12/1/23  2.  Pt will improve 5 times sit to//from stand to no greater than 15 seconds /or less to demonstrate improved functional strength. Baseline:  Goal status: MET on 02/06/2022     LONG TERM GOALS: Target date: 02/21/2022    Pt will be independent with advanced HEP. Baseline:  Goal status: MET on 02/21/2022   2.  Pt will increase BLE strength to at least 4+/5 to allow him to improve her ability to navigate stairs. Baseline: 4/5 Goal status: MET on 02/21/2022   3.  Pt will report ability to ambulate for at least 20 minutes without increased pain. Baseline:  Goal status: MET on 02/21/2022   4.  Pt will report ability to return to working out in the gym without increased pain. Baseline:  Goal status: MET, pt to return to gym next week, but no increased pain during PT session   5.  Pt will increase FOTO to at least 57% to demonstrate improvements in  functional mobility. Baseline: 33% Goal status: MET on 02/19/2022         PLAN:   PT FREQUENCY: 2x/week   PT DURATION: 8 weeks   PLANNED INTERVENTIONS: Therapeutic exercises, Therapeutic activity, Neuromuscular re-education, Balance training, Gait training, Patient/Family education, Self Care, Joint mobilization, Joint manipulation, Stair training, Vestibular training, Canalith repositioning, Aquatic Therapy, Dry Needling, Electrical stimulation, Spinal manipulation, Spinal mobilization, Cryotherapy, Moist heat, Taping, Traction, Ultrasound, Ionotophoresis '4mg'$ /ml Dexamethasone, Manual therapy, and Re-evaluation   PHYSICAL THERAPY DISCHARGE SUMMARY   Patient agrees to discharge. Patient goals were met. Patient is being discharged due to meeting the stated rehab goals.     Juel Burrow, PT 02/21/22 11:01 AM   [facility]  Specialty Rehab Services [address] [place], [place] [address] Phone # [phone] Fax [phone]

## 2022-03-05 ENCOUNTER — Encounter: Payer: Self-pay | Admitting: Family Medicine

## 2022-03-05 DIAGNOSIS — M5416 Radiculopathy, lumbar region: Secondary | ICD-10-CM

## 2022-03-16 ENCOUNTER — Ambulatory Visit (HOSPITAL_BASED_OUTPATIENT_CLINIC_OR_DEPARTMENT_OTHER)
Admission: RE | Admit: 2022-03-16 | Discharge: 2022-03-16 | Disposition: A | Discharge status: Home / Self Care | Payer: No Typology Code available for payment source | Source: Ambulatory Visit | Attending: Family Medicine | Admitting: Family Medicine

## 2022-03-16 DIAGNOSIS — M5416 Radiculopathy, lumbar region: Secondary | ICD-10-CM | POA: Diagnosis not present

## 2022-03-16 DIAGNOSIS — M5126 Other intervertebral disc displacement, lumbar region: Secondary | ICD-10-CM | POA: Diagnosis not present

## 2022-03-20 ENCOUNTER — Telehealth: Payer: Self-pay | Admitting: Family Medicine

## 2022-03-20 DIAGNOSIS — M5416 Radiculopathy, lumbar region: Secondary | ICD-10-CM

## 2022-03-20 NOTE — Telephone Encounter (Signed)
Patient requesting a call to discuss her imaging results

## 2022-03-21 NOTE — Addendum Note (Signed)
Addended by: Farrel Conners on: 03/21/2022 03:03 PM   Modules accepted: Orders

## 2022-03-21 NOTE — Telephone Encounter (Signed)
Noted  

## 2022-03-22 ENCOUNTER — Telehealth: Payer: Self-pay | Admitting: Family Medicine

## 2022-03-22 NOTE — Telephone Encounter (Signed)
Pt is calling and the other pain management group is not in her network please referral to dr Letta Pate on [address] he is in the network for pain management per pt

## 2022-03-26 ENCOUNTER — Other Ambulatory Visit: Payer: Self-pay | Admitting: Family Medicine

## 2022-03-26 DIAGNOSIS — M170 Bilateral primary osteoarthritis of knee: Secondary | ICD-10-CM

## 2022-03-26 NOTE — Telephone Encounter (Signed)
Done

## 2022-04-04 ENCOUNTER — Encounter: Payer: Self-pay | Admitting: Physical Medicine & Rehabilitation

## 2022-04-18 ENCOUNTER — Encounter: Payer: Self-pay | Admitting: Family Medicine

## 2022-04-18 ENCOUNTER — Ambulatory Visit (INDEPENDENT_AMBULATORY_CARE_PROVIDER_SITE_OTHER): Payer: No Typology Code available for payment source | Admitting: Family Medicine

## 2022-04-18 VITALS — BP 122/62 | HR 79 | Temp 98.3°F | Ht 60.0 in | Wt 167.3 lb

## 2022-04-18 DIAGNOSIS — R7303 Prediabetes: Secondary | ICD-10-CM

## 2022-04-18 DIAGNOSIS — Z0000 Encounter for general adult medical examination without abnormal findings: Secondary | ICD-10-CM | POA: Diagnosis not present

## 2022-04-18 LAB — POCT GLYCOSYLATED HEMOGLOBIN (HGB A1C): Hemoglobin A1C: 5.7 % — AB (ref 4.0–5.6)

## 2022-04-18 NOTE — Patient Instructions (Addendum)
Melatonin 5 mg every night-- may take up to 20 mg of melatonin if needed.   Try probiotics -- once or twice a day to help with gut regularity.  Health Maintenance, Female Adopting a healthy lifestyle and getting preventive care are important in promoting health and wellness. Ask your health care provider about: The right schedule for you to have regular tests and exams. Things you can do on your own to prevent diseases and keep yourself healthy. What should I know about diet, weight, and exercise? Eat a healthy diet  Eat a diet that includes plenty of vegetables, fruits, low-fat dairy products, and lean protein. Do not eat a lot of foods that are high in solid fats, added sugars, or sodium. Maintain a healthy weight Body mass index (BMI) is used to identify weight problems. It estimates body fat based on height and weight. Your health care provider can help determine your BMI and help you achieve or maintain a healthy weight. Get regular exercise Get regular exercise. This is one of the most important things you can do for your health. Most adults should: Exercise for at least 150 minutes each week. The exercise should increase your heart rate and make you sweat (moderate-intensity exercise). Do strengthening exercises at least twice a week. This is in addition to the moderate-intensity exercise. Spend less time sitting. Even light physical activity can be beneficial. Watch cholesterol and blood lipids Have your blood tested for lipids and cholesterol at 73 years of age, then have this test every 5 years. Have your cholesterol levels checked more often if: Your lipid or cholesterol levels are high. You are older than 73 years of age. You are at high risk for heart disease. What should I know about cancer screening? Depending on your health history and family history, you may need to have cancer screening at various ages. This may include screening for: Breast cancer. Cervical  cancer. Colorectal cancer. Skin cancer. Lung cancer. What should I know about heart disease, diabetes, and high blood pressure? Blood pressure and heart disease High blood pressure causes heart disease and increases the risk of stroke. This is more likely to develop in people who have high blood pressure readings or are overweight. Have your blood pressure checked: Every 3-5 years if you are 40-29 years of age. Every year if you are 12 years old or older. Diabetes Have regular diabetes screenings. This checks your fasting blood sugar level. Have the screening done: Once every three years after age 26 if you are at a normal weight and have a low risk for diabetes. More often and at a younger age if you are overweight or have a high risk for diabetes. What should I know about preventing infection? Hepatitis B If you have a higher risk for hepatitis B, you should be screened for this virus. Talk with your health care provider to find out if you are at risk for hepatitis B infection. Hepatitis C Testing is recommended for: Everyone born from [birth year] through 1966. Anyone with known risk factors for hepatitis C. Sexually transmitted infections (STIs) Get screened for STIs, including gonorrhea and chlamydia, if: You are sexually active and are younger than 73 years of age. You are older than 73 years of age and your health care provider tells you that you are at risk for this type of infection. Your sexual activity has changed since you were last screened, and you are at increased risk for chlamydia or gonorrhea. Ask your health care provider if  you are at risk. Ask your health care provider about whether you are at high risk for HIV. Your health care provider may recommend a prescription medicine to help prevent HIV infection. If you choose to take medicine to prevent HIV, you should first get tested for HIV. You should then be tested every 3 months for as long as you are taking the  medicine. Pregnancy If you are about to stop having your period (premenopausal) and you may become pregnant, seek counseling before you get pregnant. Take 400 to 800 micrograms (mcg) of folic acid every day if you become pregnant. Ask for birth control (contraception) if you want to prevent pregnancy. Osteoporosis and menopause Osteoporosis is a disease in which the bones lose minerals and strength with aging. This can result in bone fractures. If you are 56 years old or older, or if you are at risk for osteoporosis and fractures, ask your health care provider if you should: Be screened for bone loss. Take a calcium or vitamin D supplement to lower your risk of fractures. Be given hormone replacement therapy (HRT) to treat symptoms of menopause. Follow these instructions at home: Alcohol use Do not drink alcohol if: Your health care provider tells you not to drink. You are pregnant, may be pregnant, or are planning to become pregnant. If you drink alcohol: Limit how much you have to: 0-1 drink a day. Know how much alcohol is in your drink. In the [place] one drink equals one 12 oz bottle of beer (355 mL), one 5 oz glass of wine (148 mL), or one 1 oz glass of hard liquor (44 mL). Lifestyle Do not use any products that contain nicotine or tobacco. These products include cigarettes, chewing tobacco, and vaping devices, such as e-cigarettes. If you need help quitting, ask your health care provider. Do not use street drugs. Do not share needles. Ask your health care provider for help if you need support or information about quitting drugs. General instructions Schedule regular health, dental, and eye exams. Stay current with your vaccines. Tell your health care provider if: You often feel depressed. You have ever been abused or do not feel safe at home. Summary Adopting a healthy lifestyle and getting preventive care are important in promoting health and wellness. Follow your health care  provider's instructions about healthy diet, exercising, and getting tested or screened for diseases. Follow your health care provider's instructions on monitoring your cholesterol and blood pressure. This information is not intended to replace advice given to you by your health care provider. Make sure you discuss any questions you have with your health care provider. Document Revised: 06/20/2020 Document Reviewed: 06/20/2020 Elsevier Patient Education  [place].

## 2022-04-18 NOTE — Progress Notes (Signed)
Complete physical exam  Patient: Colleen Coffey   DOB: 1950-07-09   72 y.o. Female  MRN: [id number]  Subjective:    Chief Complaint  Patient presents with   Annual Exam    Colleen Coffey is a 72 y.o. female who presents today for a complete physical exam. She reports consuming a general diet. Exercise is limited by orthopedic condition(s): chronic leg and back pain, is engaging with physical therapy regularly. She generally feels fairly well. She reports sleeping fairly well. She does not have additional problems to discuss today.    Most recent fall risk assessment:    04/18/2022    9:16 AM  Fall Risk   Falls in the past year? 1  Number falls in past yr: 0  Injury with Fall? 0  Risk for fall due to : No Fall Risks  Follow up Falls evaluation completed     Most recent depression screenings:    04/18/2022    9:16 AM 01/30/2022   10:34 AM  PHQ 2/9 Scores  PHQ - 2 Score 0 0  PHQ- 9 Score 0 5   Ophthalmology: sees once a year for screening for glaucoma Dental: No current dental problems and Receives regular dental care  Patient Active Problem List   Diagnosis Date Noted   Muscle pain 10/19/2021   Prediabetes 10/19/2021   Hypertension 04/27/2020   Osteoarthritis, knee 06/27/2015   BMI 28.0-28.9,adult 06/27/2015   Constipation 06/27/2015   Osteopenia 06/27/2015      Patient Care Team: Farrel Conners, MD as PCP - General (Family Medicine)   Outpatient Medications Prior to Visit  Medication Sig   acetaminophen (TYLENOL) 325 MG tablet Take 2 tablets (650 mg total) by mouth every 6 (six) hours as needed for mild pain (or temp > 100).   amLODipine (NORVASC) 10 MG tablet Take 1 tablet (10 mg total) by mouth daily.   Cholecalciferol (VITAMIN D3 PO) Take 1,000 Units by mouth daily.   diclofenac (VOLTAREN) 75 MG EC tablet TAKE 1 TABLET BY MOUTH TWICE A DAY   Magnesium Hydroxide (DULCOLAX PO) Take by mouth.   meclizine (ANTIVERT) 12.5 MG tablet Take 0.5-1  tablets (6.25-12.5 mg total) by mouth 3 (three) times daily as needed for dizziness.   METAMUCIL FIBER PO Take 1 Dose by mouth daily.   polyethylene glycol (MIRALAX) 17 g packet Take 17 g by mouth daily.   No facility-administered medications prior to visit.    Review of Systems  HENT:  Negative for hearing loss.   Eyes:  Negative for blurred vision.  Respiratory:  Negative for shortness of breath.   Cardiovascular:  Negative for chest pain.  Gastrointestinal: Negative.   Genitourinary: Negative.   Musculoskeletal:  Negative for back pain.  Neurological:  Negative for headaches.  Psychiatric/Behavioral:  Negative for depression.   All other systems reviewed and are negative.         Objective:     BP 122/62 (BP Location: Left Arm, Patient Position: Sitting, Cuff Size: Large)   Pulse 79   Temp 98.3 F (36.8 C) (Oral)   Ht 5' (1.524 m)   Wt 167 lb 4.8 oz (75.9 kg)   LMP 01/11/2017 (Exact Date)   SpO2 99%   BMI 32.67 kg/m    Physical Exam Vitals reviewed.  Constitutional:      Appearance: Normal appearance. She is well-groomed and normal weight.  HENT:     Right Ear: Tympanic membrane and ear canal normal.  Left Ear: Tympanic membrane and ear canal normal.  Eyes:     Conjunctiva/sclera: Conjunctivae normal.  Neck:     Thyroid: No thyromegaly.  Cardiovascular:     Rate and Rhythm: Normal rate and regular rhythm.     Pulses: Normal pulses.     Heart sounds: S1 normal and S2 normal.  Pulmonary:     Effort: Pulmonary effort is normal.     Breath sounds: Normal breath sounds and air entry.  Abdominal:     General: Bowel sounds are normal.  Musculoskeletal:     Right lower leg: No edema.     Left lower leg: No edema.  Neurological:     Mental Status: She is alert and oriented to person, place, and time. Mental status is at baseline.     Gait: Gait is intact.  Psychiatric:        Mood and Affect: Mood and affect normal.        Speech: Speech normal.         Behavior: Behavior normal.        Judgment: Judgment normal.      Results for orders placed or performed in visit on 04/18/22  POC HgB A1c  Result Value Ref Range   Hemoglobin A1C 5.7 (A) 4.0 - 5.6 %   HbA1c POC (<> result, manual entry)     HbA1c, POC (prediabetic range)     HbA1c, POC (controlled diabetic range)         Assessment & Plan:    Routine Health Maintenance and Physical Exam  Immunization History  Administered Date(s) Administered   Fluad Quad(high Dose 65+) 10/16/2018, 10/20/2021   Influenza Split 11/15/2010, 11/08/2011   Influenza Whole 11/26/2006, 11/25/2007, 11/17/2008, 11/02/2009   Influenza, High Dose Seasonal PF 10/21/2014, 10/27/2015, 10/25/2016, 10/10/2017   Influenza-Unspecified 10/13/2013, 10/29/2019, 10/13/2020   PFIZER(Purple Top)SARS-COV-2 Vaccination 03/21/2019, 04/11/2019, 11/13/2019, 10/20/2020   PNEUMOCOCCAL CONJUGATE-20 04/28/2021   Pfizer Covid-19 Vaccine Bivalent Booster 13yr & up 11/09/2021   Pneumococcal Conjugate-13 12/16/2014   Pneumococcal Polysaccharide-23 10/04/2016   Respiratory Syncytial Virus Vaccine,Recomb Aduvanted(Arexvy) 11/16/2021   Td 02/12/1998, 01/04/2009, 09/25/2019   Zoster Recombinat (Shingrix) 06/23/2019, 09/02/2019   Zoster, Live 02/19/2014    Health Maintenance  Topic Date Due   COVID-19 Vaccine (6 - 2023-24 season) 05/04/2022 (Originally 01/04/2022)   Medicare Annual Wellness (AWythe  12/06/2022   MAMMOGRAM  02/14/2024   COLONOSCOPY (Pts 45-45yrInsurance coverage will need to be confirmed)  08/09/2029   DTaP/Tdap/Td (4 - Tdap) 09/24/2029   Pneumonia Vaccine 6581Years old  Completed   INFLUENZA VACCINE  Completed   DEXA SCAN  Completed   Hepatitis C Screening  Completed   Zoster Vaccines- Shingrix  Completed   HPV VACCINES  Aged Out   Fecal DNA (Cologuard)  Discontinued    Discussed health benefits of physical activity, and encouraged her to engage in regular exercise appropriate for her age and  condition.  Problem List Items Addressed This Visit       Unprioritized   Prediabetes (Chronic)   Relevant Orders   POC HgB A1c (Completed)   Other Visit Diagnoses     Routine general medical examination at a health care facility    -  Primary     Normal physical exam findings today, we discussed adding melatonin 5 mg at night for sleep and also probiotics daily as needed for her occasional constipation. RTC 6 months Return in about 6 months (around 10/19/2022) for HTN.  Farrel Conners, MD

## 2022-04-24 DIAGNOSIS — T466X5D Adverse effect of antihyperlipidemic and antiarteriosclerotic drugs, subsequent encounter: Secondary | ICD-10-CM | POA: Diagnosis not present

## 2022-04-24 DIAGNOSIS — E669 Obesity, unspecified: Secondary | ICD-10-CM | POA: Diagnosis not present

## 2022-04-24 DIAGNOSIS — I700 Atherosclerosis of aorta: Secondary | ICD-10-CM | POA: Diagnosis not present

## 2022-04-24 DIAGNOSIS — R7303 Prediabetes: Secondary | ICD-10-CM | POA: Diagnosis not present

## 2022-04-24 DIAGNOSIS — Z6834 Body mass index (BMI) 34.0-34.9, adult: Secondary | ICD-10-CM | POA: Diagnosis not present

## 2022-04-24 DIAGNOSIS — E785 Hyperlipidemia, unspecified: Secondary | ICD-10-CM | POA: Diagnosis not present

## 2022-04-24 DIAGNOSIS — Z008 Encounter for other general examination: Secondary | ICD-10-CM | POA: Diagnosis not present

## 2022-04-24 DIAGNOSIS — R2681 Unsteadiness on feet: Secondary | ICD-10-CM | POA: Diagnosis not present

## 2022-04-24 DIAGNOSIS — I10 Essential (primary) hypertension: Secondary | ICD-10-CM | POA: Diagnosis not present

## 2022-04-26 ENCOUNTER — Encounter: Payer: Self-pay | Admitting: Physical Medicine & Rehabilitation

## 2022-04-26 ENCOUNTER — Encounter
Payer: No Typology Code available for payment source | Attending: Physical Medicine & Rehabilitation | Admitting: Physical Medicine & Rehabilitation

## 2022-04-26 VITALS — BP 131/77 | HR 74 | Ht 60.0 in | Wt 166.0 lb

## 2022-04-26 DIAGNOSIS — M7061 Trochanteric bursitis, right hip: Secondary | ICD-10-CM | POA: Insufficient documentation

## 2022-04-26 DIAGNOSIS — M961 Postlaminectomy syndrome, not elsewhere classified: Secondary | ICD-10-CM | POA: Diagnosis not present

## 2022-04-26 DIAGNOSIS — M7062 Trochanteric bursitis, left hip: Secondary | ICD-10-CM | POA: Insufficient documentation

## 2022-04-26 NOTE — Progress Notes (Signed)
Subjective:    Patient ID: Colleen Coffey, female    DOB: [birth year], 72 y.o.   MRN: [id number]  HPI Chief complaint buttock hip and thigh pain. 72 year old female with complaints of bilateral buttock anterior and posterior thigh pain onset in September 2023.  The patient has a history of L5-S1 fusion approximately 10 years ago.  She has 8 out of 10 pain on average although right now 5/10.  Pain is worsened with walking standing and bending.  Improves with rest heat as well as exercise medication.  She can ambulate 15 to 20 minutes at a time she is able to climb steps as well as drive.  She retired in 2011  No relief with celebrex and taken off statin medication  Still has pain with walking and standing.  Standing tolerance 15-79mn, walking tolerance ~15-269m Pain radiates to the thighs but not below the knee Legs start out feeling weak  Sitting is helpful Pt retired but is mod I with all self care, no assistive device for ambulation, has difficulty with vacuuming , washing tub, standing for washing dishes.  Had Physical therapy 2x per week from Nov 2023, Jan 2024.   CLINICAL DATA:  Lumbar radiculopathy.   EXAM: MRI LUMBAR SPINE WITHOUT CONTRAST   TECHNIQUE: Multiplanar, multisequence MR imaging of the lumbar spine was performed. No intravenous contrast was administered.   COMPARISON:  None   FINDINGS: Segmentation:  Standard.   Alignment:  Physiologic.   Vertebrae: No fracture, evidence of discitis, or bone lesion. Postsurgical changes from L5-S1 posterior spinal fusion with interbody spacer placement.   Conus medullaris and cauda equina: Conus extends to the L1-L2 disc space level. Level. Conus and cauda equina appear normal.   Paraspinal and other soft tissues: T2 hyperintense lesion along the posterior margin of the left interpolar kidney likely represents a small renal cyst.   Disc levels:   T11-T12: Minimal disc bulge. No significant facet  degenerative change. No spinal canal stenosis. No neural foraminal stenosis.   T12-L1: No significant disc bulge bulge. Mild bilateral facet degenerative change. No spinal canal stenosis. No neural foraminal stenosis.   L1-L2: No significant disc bulge bulge. Mild bilateral facet degenerative change. No spinal canal stenosis. No neural foraminal stenosis.   L2-L3: No significant disc bulge. No spinal canal stenosis mild bilateral facet degenerative change. No neural foraminal stenosis   L3-L4: Mild eccentric left disc bulge. Bilateral facet degenerative change with fluid in the facet joints. No spinal canal stenosis. Mild bilateral neural foraminal stenosis.   L4-L5: Junctional level. Moderate bilateral facet degenerative change with fluid in the facet joints. Mild overall spinal canal narrowing. Minimal disc bulge. Mild bilateral neural foraminal narrowing, right-greater-than-left.   L5-S1: Status post spinal fusion. No spinal canal stenosis. No neural foraminal stenosis.   IMPRESSION: Postsurgical changes from L5-S1 posterior spinal fusion. No evidence of hardware complications. There is mild junctional level degenerative change at L4-L5 where there is mild spinal canal and bilateral neural foraminal narrowing. There is also moderate bilateral facet degenerative change at this level with fluid in the facet joints. No evidence of high-grade spinal canal or neural foraminal stenosis.     Electronically Signed   By: HeMarin Roberts.D.   On: 03/16/2022 13:15 Pain Inventory Average Pain 8 Pain Right Now 5 My pain is intermittent, sharp, dull, and aching  In the last 24 hours, has pain interfered with the following? General activity 1 Relation with others 4 Enjoyment of life 6 What TIME of  day is your pain at its worst? daytime and evening Sleep (in general) Fair  Pain is worse with: walking, bending, standing, and some activites Pain improves with: rest, heat/ice,  therapy/exercise, and medication Relief from Meds: 2  walk without assistance how many minutes can you walk? 15-20 ability to climb steps?  yes do you drive?  yes Do you have any goals in this area?  no  not employed: date last employed 05/2009  trouble walking  New patient  Primary care      Family History  Problem Relation Age of Onset   High blood pressure Mother    High Cholesterol Mother    Dementia Mother    Arthritis Mother    Heart disease Mother    Syncope episode Mother    High blood pressure Father    COPD Father    High blood pressure Sister    Parkinson's disease Sister    Arthritis Sister    High blood pressure Maternal Grandmother    High Cholesterol Maternal Grandmother    Dementia Maternal Grandmother    Arthritis Maternal Grandmother    COPD Brother    High blood pressure Brother    Hyperlipidemia Other    Hypertension Other    Depression Other    Esophageal cancer Neg Hx    Colon cancer Neg Hx    Pancreatic cancer Neg Hx    Stomach cancer Neg Hx    Liver disease Neg Hx    Breast cancer Neg Hx    Social History   Socioeconomic History   Marital status: Widowed    Spouse name: Not on file   Number of children: 2   Years of education: Not on file   Highest education level: Some college, no degree  Occupational History   Occupation: Retired  Tobacco Use   Smoking status: Never   Smokeless tobacco: Never  Vaping Use   Vaping Use: Never used  Substance and Sexual Activity   Alcohol use: No   Drug use: No   Sexual activity: Never    Birth control/protection: Post-menopausal  Other Topics Concern   Not on file  Social History Narrative   Work or School: retired, used to be a Librarian, academic for [employer]      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; as a Engineer, materials; husband and mother passed in 2013      Spiritual Beliefs: Christian      Lifestyle: exercising; eating healthy      Social Determinants of Health   Financial Resource Strain: [place]  (12/05/2021)   Overall Financial Resource Strain (CARDIA)    Difficulty of Paying Living Expenses: Not hard at all  Food Insecurity: No Food Insecurity (12/05/2021)   Hunger Vital Sign    Worried About Running Out of Food in the Last Year: Never true    [place] in the Last Year: Never true  Transportation Needs: No Transportation Needs (12/05/2021)   PRAPARE - Hydrologist (Medical): No    Lack of Transportation (Non-Medical): No  Physical Activity: Sufficiently Active (12/05/2021)   Exercise Vital Sign    Days of Exercise per Week: 3 days    Minutes of Exercise per Session: 90 min  Stress: No Stress Concern Present (12/05/2021)   [employer]    Feeling of Stress : Only a little  Social Connections: Moderately Integrated (09/28/2021)   Social Connection and Isolation  Panel [NHANES]    Frequency of Communication with Friends and Family: More than three times a week    Frequency of Social Gatherings with Friends and Family: Once a week    Attends Religious Services: More than 4 times per year    Active Member of [employer] or Organizations: Yes    Attends Archivist Meetings: More than 4 times per year    Marital Status: Widowed   Past Surgical History:  Procedure Laterality Date   APPENDECTOMY     BACK SURGERY  2014   bulging disc lumbar spine   BREAST CYST ASPIRATION Right    CESAREAN SECTION     2   CHOLECYSTECTOMY N/A 11/16/2020   Procedure: LAPAROSCOPIC CHOLECYSTECTOMY;  Surgeon: [name], MD;  Location: [place] [facility];  Service: General;  Laterality: N/A;   Past Medical History:  Diagnosis Date   Constipation    chronic, improved with healthy diet   Endocervical polyp    GERD (gastroesophageal reflux disease)    Hemorrhoids    resolved   Hypertension    Osteoarthritis, knee 06/27/2015   -saw [facility]    Osteopenia 06/27/2015   -reports on medication  remotely with gyn    BP (!) 144/78   Pulse 74   Ht 5' (1.524 m)   Wt 166 lb (75.3 kg)   LMP 01/11/2017 (Exact Date)   SpO2 97%   BMI 32.42 kg/m   Opioid Risk Score:   Fall Risk Score:  `1  Depression screen [facility] 2/9     04/26/2022   11:27 AM 04/18/2022    9:16 AM 01/30/2022   10:34 AM 12/05/2021   11:16 AM 12/05/2021    9:13 AM 10/18/2021    9:22 AM 09/28/2021    1:27 PM  Depression screen PHQ 2/9  Decreased Interest 0 0 0 0 0 0 0  Down, Depressed, Hopeless 0 0 0 0 0 0   PHQ - 2 Score 0 0 0 0 0 0 0  Altered sleeping 2 0 1  0 1 1  Tired, decreased energy 2 0 2  0 1 1  Change in appetite 0 0 2  0 2 0  Feeling bad or failure about yourself  0 0 0  0 0 0  Trouble concentrating 0 0 0  0 0 0  Moving slowly or fidgety/restless 3 0 0  0 0 0  Suicidal thoughts 0 0 0  0 0 0  PHQ-9 Score 7 0 5  0 4 2  Difficult doing work/chores   Somewhat difficult   Not difficult at all Not difficult at all      Review of Systems  Gastrointestinal:  Positive for constipation.  Musculoskeletal:        B/L legs  All other systems reviewed and are negative.      Objective:   Physical Exam Vitals reviewed.  Constitutional:      Appearance: She is obese.  HENT:     Head: Normocephalic and atraumatic.  Eyes:     Extraocular Movements: Extraocular movements intact.     Conjunctiva/sclera: Conjunctivae normal.     Pupils: Pupils are equal, round, and reactive to light.  Musculoskeletal:     Right lower leg: No edema.     Left lower leg: No edema.  Skin:    General: Skin is warm and dry.  Neurological:     General: No focal deficit present.     Mental Status: She is alert  and oriented to person, place, and time.  Psychiatric:        Mood and Affect: Mood normal.        Behavior: Behavior normal.   Tenderness palpation bilateral greater trochanters of the hip.  Pain with lateral compression test over the hip. Negative distraction test Negative Faber's Negative thigh thrust Positive  prone compression over the right greater than left SI area. Lumbar spine has no tenderness PIP palpation over the lumbar paraspinals.  She has normal lumbar range of motion with flexion extension and has pain only at endrange of lumbar extension. Negative straight leg raise bilaterally Motor strength is 5/5 bilateral hip flexor knee extensor ankle dorsiflexor.        Assessment & Plan:   1.  Buttock and hip pain appears to be mainly lateral most consistent with trochanteric bursitis/gluteus medius syndrome.  She has undergone physical therapy multiple sessions in November December and part of January.   She has previously undergone L5-S1 fusion which puts her at risk for both lumbar facet degeneration and pain at the L4-5 level as well as sacroiliac related pain.  Her physical exam exam finding at these areas is equivocal however.  She has had MRI of the lumbar spine which was reviewed both report and images.  Have given her exercises to do which she states she will try.  Have scheduled for bilateral trochanteric bursa hip injection under ultrasound guidance.

## 2022-04-26 NOTE — Progress Notes (Signed)
   Subjective:    Patient ID: Colleen Coffey, female    DOB: 29-Mar-1950, 72 y.o.   MRN: [id number]  HPI    Review of Systems     Objective:   Physical Exam        Assessment & Plan:

## 2022-04-26 NOTE — Patient Instructions (Signed)
Hip Bursitis Rehab Ask your health care provider which exercises are safe for you. Do exercises exactly as told by your health care provider and adjust them as directed. It is normal to feel mild stretching, pulling, tightness, or discomfort as you do these exercises. Stop right away if you feel sudden pain or your pain gets worse. Do not begin these exercises until told by your health care provider. Stretching exercise This exercise warms up your muscles and joints and improves the movement and flexibility of your hip. This exercise also helps to relieve pain and stiffness. Iliotibial band stretch An iliotibial band is a strong band of muscle tissue that runs from the outer side of your hip to the outer side of your thigh and knee. Lie on your side with your left / right leg in the top position. Bend your left / right knee and grab your ankle. Stretch out your bottom arm to help you balance. Slowly bring your knee back so your thigh is slightly behind your body. Slowly lower your knee toward the floor until you feel a gentle stretch on the outside of your left / right thigh. If you do not feel a stretch and your knee will not lower more toward the floor, place the heel of your other foot on top of your knee and pull your knee down toward the floor with your foot. Hold this position for __________ seconds. Slowly return to the starting position. Repeat __________ times. Complete this exercise __________ times a day. Strengthening exercises These exercises build strength and endurance in your hip and pelvis. Endurance is the ability to use your muscles for a long time, even after they get tired. Bridge This exercise strengthens the muscles that move your thigh backward (hip extensors). Lie on your back on a firm surface with your knees bent and your feet flat on the floor. Tighten your buttocks muscles and lift your buttocks off the floor until your trunk is level with your thighs. Do not arch your  back. You should feel the muscles working in your buttocks and the back of your thighs. If you do not feel these muscles, slide your feet 1-2 inches (2.5-5 cm) farther away from your buttocks. If this exercise is too easy, try doing it with your arms crossed over your chest. Hold this position for __________ seconds. Slowly lower your hips to the starting position. Let your muscles relax completely after each repetition. Repeat __________ times. Complete this exercise __________ times a day. Squats This exercise strengthens the muscles in front of your thigh and knee (quadriceps). Stand in front of a table, with your feet and knees pointing straight ahead. You may rest your hands on the table for balance but not for support. Slowly bend your knees and lower your hips like you are going to sit in a chair. Keep your weight over your heels, not over your toes. Keep your lower legs upright so they are parallel with the table legs. Do not let your hips go lower than your knees. Do not bend lower than told by your health care provider. If your hip pain increases, do not bend as low. Hold the squat position for __________ seconds. Slowly push with your legs to return to standing. Do not use your hands to pull yourself to standing. Repeat __________ times. Complete this exercise __________ times a day. Hip hike  Stand sideways on a bottom step. Stand on your left / right leg with your other foot unsupported next to   the step. You can hold on to the railing or wall for balance if needed. Keep your knees straight and your torso square. Then lift your left / right hip up toward the ceiling. Hold this position for __________ seconds. Slowly let your left / right hip lower toward the floor, past the starting position. Your foot should get closer to the floor. Do not lean or bend your knees. Repeat __________ times. Complete this exercise __________ times a day. Single leg stand This exercise increases  your balance. Without shoes, stand near a railing or in a doorway. You may hold on to the railing or door frame as needed for balance. Squeeze your left / right buttock muscles, then lift up your other foot. Do not let your left / right hip push out to the side. It is helpful to stand in front of a mirror for this exercise so you can watch your hip. Hold this position for __________ seconds. Repeat __________ times. Complete this exercise __________ times a day. This information is not intended to replace advice given to you by your health care provider. Make sure you discuss any questions you have with your health care provider. Document Revised: 01/11/2021 Document Reviewed: 01/11/2021 Elsevier Patient Education  2023 Elsevier Inc.  

## 2022-05-03 ENCOUNTER — Encounter: Payer: Self-pay | Admitting: Physician Assistant

## 2022-05-03 ENCOUNTER — Ambulatory Visit: Payer: No Typology Code available for payment source | Admitting: Physician Assistant

## 2022-05-03 VITALS — HR 99 | Ht 60.0 in | Wt 166.0 lb

## 2022-05-03 DIAGNOSIS — U071 COVID-19: Secondary | ICD-10-CM

## 2022-05-03 MED ORDER — NIRMATRELVIR/RITONAVIR (PAXLOVID)TABLET: 3.0000 | ORAL_TABLET | Freq: Two times a day (BID) | ORAL | 0 refills | Status: AC

## 2022-05-03 NOTE — Progress Notes (Signed)
Colleen Coffey is a 72 y.o. female here for a new problem.  History of Present Illness:   Chief Complaint  Patient presents with   Covid Positive    Pt had positive Covid test last night and this morning. Symptoms started yesterday: Headache, chills, slight cough, fatigue, scratchy throat. Denies: nausea, diarrhea, chest pain or SOB. Took Tylenol    HPI  COVID-19 Patient reports positive Covid test last night and this morning. Symptoms started yesterday: Headache, chills, slight cough, fatigue, scratchy throat. Denies: nausea, diarrhea, chest pain or SOB. Took Tylenol   She has had all COVID vaccinations She has never had covid Denies hx of COPD/asthma/smoking  Past Medical History:  Diagnosis Date   Constipation    chronic, improved with healthy diet   Endocervical polyp    GERD (gastroesophageal reflux disease)    Hemorrhoids    resolved   Hypertension    Osteoarthritis, knee 06/27/2015   -[facility]    Osteopenia 06/27/2015   -reports on medication remotely with gyn      Social History   Tobacco Use   Smoking status: Never   Smokeless tobacco: Never  Vaping Use   Vaping Use: Never used  Substance Use Topics   Alcohol use: No   Drug use: No    Past Surgical History:  Procedure Laterality Date   APPENDECTOMY     BACK SURGERY  2014   bulging disc lumbar spine   BREAST CYST ASPIRATION Right    CESAREAN SECTION     2   CHOLECYSTECTOMY N/A 11/16/2020   Procedure: LAPAROSCOPIC CHOLECYSTECTOMY;  Surgeon: Jovita Kussmaul, MD;  Location: [facility] OR;  Service: General;  Laterality: N/A;    Family History  Problem Relation Age of Onset   High blood pressure Mother    High Cholesterol Mother    Dementia Mother    Arthritis Mother    Heart disease Mother    Syncope episode Mother    High blood pressure Father    COPD Father    High blood pressure Sister    Parkinson's disease Sister    Arthritis Sister    High blood pressure Maternal Grandmother     High Cholesterol Maternal Grandmother    Dementia Maternal Grandmother    Arthritis Maternal Grandmother    COPD Brother    High blood pressure Brother    Hyperlipidemia Other    Hypertension Other    Depression Other    Esophageal cancer Neg Hx    Colon cancer Neg Hx    Pancreatic cancer Neg Hx    Stomach cancer Neg Hx    Liver disease Neg Hx    Breast cancer Neg Hx     No Known Allergies  Current Medications:   Current Outpatient Medications:    acetaminophen (TYLENOL) 325 MG tablet, Take 2 tablets (650 mg total) by mouth every 6 (six) hours as needed for mild pain (or temp > 100)., Disp: , Rfl:    amLODipine (NORVASC) 10 MG tablet, Take 1 tablet (10 mg total) by mouth daily., Disp: 90 tablet, Rfl: 1   Cholecalciferol (VITAMIN D3 PO), Take 1,000 Units by mouth daily., Disp: , Rfl:    diclofenac (VOLTAREN) 75 MG EC tablet, TAKE 1 TABLET BY MOUTH TWICE A DAY, Disp: 180 tablet, Rfl: 0   Magnesium Hydroxide (DULCOLAX PO), Take by mouth., Disp: , Rfl:    meclizine (ANTIVERT) 12.5 MG tablet, Take 0.5-1 tablets (6.25-12.5 mg total) by mouth 3 (three)  times daily as needed for dizziness., Disp: 36 tablet, Rfl: 0   METAMUCIL FIBER PO, Take 1 Dose by mouth daily., Disp: , Rfl:    polyethylene glycol (MIRALAX) 17 g packet, Take 17 g by mouth daily., Disp: , Rfl:    Review of Systems:   ROS Negative unless otherwise specified per HPI.  Vitals:   Vitals:   05/03/22 1012  Pulse: 99  SpO2: 96%  Weight: 166 lb (75.3 kg)  Height: 5' (1.524 m)     Body mass index is 32.42 kg/m.  Physical Exam:   Physical Exam Vitals and nursing note reviewed.  Constitutional:      General: She is not in acute distress.    Appearance: She is well-developed. She is not ill-appearing or toxic-appearing.  Cardiovascular:     Rate and Rhythm: Normal rate and regular rhythm.     Pulses: Normal pulses.     Heart sounds: Normal heart sounds, S1 normal and S2 normal.  Pulmonary:     Effort:  Pulmonary effort is normal.     Breath sounds: Normal breath sounds.  Skin:    General: Skin is warm and dry.  Neurological:     Mental Status: She is alert.     GCS: GCS eye subscore is 4. GCS verbal subscore is 5. GCS motor subscore is 6.  Psychiatric:        Speech: Speech normal.        Behavior: Behavior normal. Behavior is cooperative.     Assessment and Plan:   COVID-19 No red flags on exam.  Will initiate paxlovid per orders. Discussed taking medications as prescribed. Reviewed return precautions including worsening fever, SOB, worsening cough or other concerns. Push fluids and rest. I recommend that patient follow-up if symptoms worsen or persist despite treatment x 7-10 days, sooner if needed.  Inda Coke, PA-C

## 2022-05-24 ENCOUNTER — Encounter: Payer: Self-pay | Admitting: Physical Medicine & Rehabilitation

## 2022-05-24 ENCOUNTER — Encounter
Payer: No Typology Code available for payment source | Attending: Physical Medicine & Rehabilitation | Admitting: Physical Medicine & Rehabilitation

## 2022-05-24 VITALS — BP 149/84 | HR 80 | Ht 60.0 in | Wt 164.0 lb

## 2022-05-24 DIAGNOSIS — M7061 Trochanteric bursitis, right hip: Secondary | ICD-10-CM

## 2022-05-24 DIAGNOSIS — M7062 Trochanteric bursitis, left hip: Secondary | ICD-10-CM

## 2022-05-24 MED ORDER — BETAMETHASONE SOD PHOS & ACET 6 (3-3) MG/ML IJ SUSP
6.0000 mg | Freq: Once | INTRAMUSCULAR | Status: AC
  Administered 2022-05-24: 6 mg via INTRAMUSCULAR

## 2022-05-24 MED ORDER — LIDOCAINE HCL 1 % IJ SOLN
4.0000 mL | Freq: Once | INTRAMUSCULAR | Status: AC
  Administered 2022-05-24: 4 mL

## 2022-05-24 NOTE — Patient Instructions (Signed)
Hip Bursitis Rehab Ask your health care provider which exercises are safe for you. Do exercises exactly as told by your health care provider and adjust them as directed. It is normal to feel mild stretching, pulling, tightness, or discomfort as you do these exercises. Stop right away if you feel sudden pain or your pain gets worse. Do not begin these exercises until told by your health care provider. Stretching exercise This exercise warms up your muscles and joints and improves the movement and flexibility of your hip. This exercise also helps to relieve pain and stiffness. Iliotibial band stretch An iliotibial band is a strong band of muscle tissue that runs from the outer side of your hip to the outer side of your thigh and knee. Lie on your side with your left / right leg in the top position. Bend your left / right knee and grab your ankle. Stretch out your bottom arm to help you balance. Slowly bring your knee back so your thigh is slightly behind your body. Slowly lower your knee toward the floor until you feel a gentle stretch on the outside of your left / right thigh. If you do not feel a stretch and your knee will not lower more toward the floor, place the heel of your other foot on top of your knee and pull your knee down toward the floor with your foot. Hold this position for __________ seconds. Slowly return to the starting position. Repeat __________ times. Complete this exercise __________ times a day. Strengthening exercises These exercises build strength and endurance in your hip and pelvis. Endurance is the ability to use your muscles for a long time, even after they get tired. Bridge This exercise strengthens the muscles that move your thigh backward (hip extensors). Lie on your back on a firm surface with your knees bent and your feet flat on the floor. Tighten your buttocks muscles and lift your buttocks off the floor until your trunk is level with your thighs. Do not arch your  back. You should feel the muscles working in your buttocks and the back of your thighs. If you do not feel these muscles, slide your feet 1-2 inches (2.5-5 cm) farther away from your buttocks. If this exercise is too easy, try doing it with your arms crossed over your chest. Hold this position for __________ seconds. Slowly lower your hips to the starting position. Let your muscles relax completely after each repetition. Repeat __________ times. Complete this exercise __________ times a day. Squats This exercise strengthens the muscles in front of your thigh and knee (quadriceps). Stand in front of a table, with your feet and knees pointing straight ahead. You may rest your hands on the table for balance but not for support. Slowly bend your knees and lower your hips like you are going to sit in a chair. Keep your weight over your heels, not over your toes. Keep your lower legs upright so they are parallel with the table legs. Do not let your hips go lower than your knees. Do not bend lower than told by your health care provider. If your hip pain increases, do not bend as low. Hold the squat position for __________ seconds. Slowly push with your legs to return to standing. Do not use your hands to pull yourself to standing. Repeat __________ times. Complete this exercise __________ times a day. Hip hike  Stand sideways on a bottom step. Stand on your left / right leg with your other foot unsupported next to   the step. You can hold on to the railing or wall for balance if needed. Keep your knees straight and your torso square. Then lift your left / right hip up toward the ceiling. Hold this position for __________ seconds. Slowly let your left / right hip lower toward the floor, past the starting position. Your foot should get closer to the floor. Do not lean or bend your knees. Repeat __________ times. Complete this exercise __________ times a day. Single leg stand This exercise increases  your balance. Without shoes, stand near a railing or in a doorway. You may hold on to the railing or door frame as needed for balance. Squeeze your left / right buttock muscles, then lift up your other foot. Do not let your left / right hip push out to the side. It is helpful to stand in front of a mirror for this exercise so you can watch your hip. Hold this position for __________ seconds. Repeat __________ times. Complete this exercise __________ times a day. This information is not intended to replace advice given to you by your health care provider. Make sure you discuss any questions you have with your health care provider. Document Revised: 01/11/2021 Document Reviewed: 01/11/2021 Elsevier Patient Education  2023 Elsevier Inc.  

## 2022-05-24 NOTE — Progress Notes (Signed)
Bilateral Trochanteric bursa injection With ultrasound guidance 15Hz  Linear transducer   Indication Trochanteric bursitis. Exam has tenderness over the greater trochanter of the hip. Pain has not responded to conservative care such as exercise therapy and oral medications. Pain interferes with sleep or with mobility Informed consent was obtained after describing risks and benefits of the procedure with the patient these include bleeding bruising and infection. Patient has signed written consent form. Patient placed in a lateral decubitus position with the affected hip superior. Point of maximal pain was palpated marked and prepped with Betadine and entered with a needle to bone contact. Needle slightly withdrawn then 6mg  of betamethasone with 4 cc 1% lidocaine were injected. Patient tolerated procedure well. Post procedure instructions given. May stop p.o. Diclofenac

## 2022-06-29 ENCOUNTER — Encounter: Payer: Self-pay | Admitting: Family Medicine

## 2022-07-05 ENCOUNTER — Encounter: Payer: Self-pay | Admitting: Physical Medicine & Rehabilitation

## 2022-07-05 ENCOUNTER — Encounter
Payer: No Typology Code available for payment source | Attending: Physical Medicine & Rehabilitation | Admitting: Physical Medicine & Rehabilitation

## 2022-07-05 VITALS — BP 131/77 | HR 77 | Ht 60.0 in | Wt 165.0 lb

## 2022-07-05 DIAGNOSIS — M961 Postlaminectomy syndrome, not elsewhere classified: Secondary | ICD-10-CM

## 2022-07-05 DIAGNOSIS — M7061 Trochanteric bursitis, right hip: Secondary | ICD-10-CM | POA: Insufficient documentation

## 2022-07-05 DIAGNOSIS — M533 Sacrococcygeal disorders, not elsewhere classified: Secondary | ICD-10-CM | POA: Diagnosis not present

## 2022-07-05 DIAGNOSIS — M7062 Trochanteric bursitis, left hip: Secondary | ICD-10-CM | POA: Diagnosis not present

## 2022-07-05 NOTE — Progress Notes (Signed)
Subjective:    Patient ID: Colleen Coffey, female    DOB: February 19, 1950, 72 y.o.   MRN: [id number]  HPI 72 year old female with history of morbid obesity chronic hip and low back and buttock pain.  Prior history of L5-S1 fusion.  She denies any pain above the waist or above the level of the fusion. Some itching in trunk in the last month or so did not respond to cortisone cream or benadryl.  No worsening with time, following up with primary care Lateral hip is much better since the bilateral ultrasound-guided troch bursa injection performed around 6 weeks ago. No significant pain going down the legs Pain Inventory Average Pain 6 Pain Right Now 3 My pain is intermittent, stabbing, and aching  In the last 24 hours, has pain interfered with the following? General activity 0 Relation with others 0 Enjoyment of life 0 What TIME of day is your pain at its worst? evening Sleep (in general) Good  Pain is worse with: walking, bending, and standing Pain improves with: rest and injections Relief from Meds: 7  Family History  Problem Relation Age of Onset   High blood pressure Mother    High Cholesterol Mother    Dementia Mother    Arthritis Mother    Heart disease Mother    Syncope episode Mother    High blood pressure Father    COPD Father    High blood pressure Sister    Parkinson's disease Sister    Arthritis Sister    High blood pressure Maternal Grandmother    High Cholesterol Maternal Grandmother    Dementia Maternal Grandmother    Arthritis Maternal Grandmother    COPD Brother    High blood pressure Brother    Hyperlipidemia Other    Hypertension Other    Depression Other    Esophageal cancer Neg Hx    Colon cancer Neg Hx    Pancreatic cancer Neg Hx    Stomach cancer Neg Hx    Liver disease Neg Hx    Breast cancer Neg Hx    Social History   Socioeconomic History   Marital status: Widowed    Spouse name: Not on file   Number of children: 2   Years of  education: Not on file   Highest education level: Some college, no degree  Occupational History   Occupation: Retired  Tobacco Use   Smoking status: Never   Smokeless tobacco: Never  Vaping Use   Vaping Use: Never used  Substance and Sexual Activity   Alcohol use: No   Drug use: No   Sexual activity: Never    Birth control/protection: Post-menopausal  Other Topics Concern   Not on file  Social History Narrative   Work or School: retired, used to be a Merchandiser, retail for [employer]      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; as a Software engineer; husband and mother passed in 2013      Spiritual Beliefs: Christian      Lifestyle: exercising; eating healthy      Social Determinants of Health   Financial Resource Strain: Low Risk  (12/05/2021)   Overall Financial Resource Strain (CARDIA)    Difficulty of Paying Living Expenses: Not hard at all  Food Insecurity: No Food Insecurity (12/05/2021)   Hunger Vital Sign    Worried About Running Out of Food in the Last Year: Never true    Ran Out of Food in the Last Year: Never true  Transportation Needs: No  Transportation Needs (12/05/2021)   PRAPARE - Administrator, Civil Service (Medical): No    Lack of Transportation (Non-Medical): No  Physical Activity: Sufficiently Active (12/05/2021)   Exercise Vital Sign    Days of Exercise per Week: 3 days    Minutes of Exercise per Session: 90 min  Stress: No Stress Concern Present (12/05/2021)   [employer] of Occupational Health - Occupational Stress Questionnaire    Feeling of Stress : Only a little  Social Connections: Moderately Integrated (09/28/2021)   Social Connection and Isolation Panel [NHANES]    Frequency of Communication with Friends and Family: More than three times a week    Frequency of Social Gatherings with Friends and Family: Once a week    Attends Religious Services: More than 4 times per year    Active Member of [employer] or Organizations: Yes    Attends Tax inspector Meetings: More than 4 times per year    Marital Status: Widowed   Past Surgical History:  Procedure Laterality Date   APPENDECTOMY     BACK SURGERY  2014   bulging disc lumbar spine   BREAST CYST ASPIRATION Right    CESAREAN SECTION     2   CHOLECYSTECTOMY N/A 11/16/2020   Procedure: LAPAROSCOPIC CHOLECYSTECTOMY;  Surgeon: Griselda Miner, MD;  Location: [facility] OR;  Service: General;  Laterality: N/A;   Past Surgical History:  Procedure Laterality Date   APPENDECTOMY     BACK SURGERY  2014   bulging disc lumbar spine   BREAST CYST ASPIRATION Right    CESAREAN SECTION     2   CHOLECYSTECTOMY N/A 11/16/2020   Procedure: LAPAROSCOPIC CHOLECYSTECTOMY;  Surgeon: Chevis Pretty III, MD;  Location: [facility] OR;  Service: General;  Laterality: N/A;   Past Medical History:  Diagnosis Date   Constipation    chronic, improved with healthy diet   Endocervical polyp    GERD (gastroesophageal reflux disease)    Hemorrhoids    resolved   Hypertension    Osteoarthritis, knee 06/27/2015   -[facility]    Osteopenia 06/27/2015   -reports on medication remotely with gyn    BP 131/77   Pulse 77   Ht 5' (1.524 m)   Wt 165 lb (74.8 kg)   LMP 01/11/2017 (Exact Date)   SpO2 100%   BMI 32.22 kg/m   Opioid Risk Score:   Fall Risk Score:  `1  Depression screen [facility] 2/9     07/05/2022   10:59 AM 05/24/2022   11:29 AM 04/26/2022   11:27 AM 04/18/2022    9:16 AM 01/30/2022   10:34 AM 12/05/2021   11:16 AM 12/05/2021    9:13 AM  Depression screen [facility] 2/9  Decreased Interest 0 0 0 0 0 0 0  Down, Depressed, Hopeless 0 0 0 0 0 0 0  PHQ - 2 Score 0 0 0 0 0 0 0  Altered sleeping   2 0 1  0  Tired, decreased energy   2 0 2  0  Change in appetite   0 0 2  0  Feeling bad or failure about yourself    0 0 0  0  Trouble concentrating   0 0 0  0  Moving slowly or fidgety/restless   3 0 0  0  Suicidal thoughts   0 0 0  0  PHQ-9 Score   7 0 5  0  Difficult doing work/chores  Somewhat  difficult       Review of Systems  Musculoskeletal:  Positive for back pain.       B/L thigh pain  All other systems reviewed and are negative.      Objective:   Physical Exam  Minimal tenderness around the greater trochanteric bursa area bilaterally. Moderate tenderness right greater than left PSIS area Positive Faber's right side Hip range of motion is reduced internal/external rotation bilaterally. Negative thigh thrust Negative distraction test Positive prone compression Positive lateral compression right side only Negative straight leg raising bilaterally Motor strength is 5/5 bilateral lower extremities Sensation normal bilateral lower extremities Lumbar range of motion 50% flexion extension lateral bending and rotation.     Assessment & Plan:  #1.  Chronic low back pain with pain predominantly below the level of the fusion.  Patient has SI provocative signs 3/5 positive we discussed treatment options including chiropractic care as well as sacroiliac injections she would like to think about this. 2.  Bilateral hip tightness bilateral trochanteric bursitis, hip x-rays negative around 6 months ago.  Knee repeat trochanteric bursa injections under ultrasound guidance in 2 to 3 months if needed.

## 2022-07-05 NOTE — Patient Instructions (Signed)
Sacroiliac Joint Injection A sacroiliac (SI) joint injection is a procedure to inject a numbing medicine (anesthetic block)--and sometimes a strong anti-inflammatory medicine (steroid)--into the SI joint. The SI joint is the joint between two bones of the pelvis called the sacrum and the ilium. The sacrum is the bone at the base of the spine. The ilium is the large bone that forms the hip. You may need this procedure if you have pain because of an inflamed or diseased SI joint. Various conditions can cause pain in the SI joint, including rheumatoid arthritis, gout, psoriatic arthritis, infection, or injury. SI joint pain is a common cause of lower back pain. It may also cause pain in your buttocks or leg. SI joint injection may be done to: Find out if an anesthetic block relieves pain. This can confirm that the SI joint is the cause of pain (diagnostic use). Treat a painful SI joint with steroids, anesthetic medicine, or both (therapeutic use). Tell a health care provider about: Any allergies you have. All medicines you are taking, including vitamins, herbs, eye drops, creams, and over-the-counter medicines. Any problems you or family members have had with anesthetic medicines. Any blood disorders you have. Any surgeries you have had. Any medical conditions you have. Whether you are pregnant or may be pregnant. What are the risks? Generally, this is a safe procedure. However, problems may occur, including: Infection. Bleeding. Nerve injury. Temporary increase in pain or failure to relieve pain. Headache. Bruising or soreness at the joint, in deep tissues, or at the injection site. Allergic reactions to medicines or dyes. There may also be side effects from the steroid medicine. These may include facial flushing, increased appetite, diarrhea, and increased blood sugar. What happens before the procedure? Medicines Ask your health care provider about: Changing or stopping your regular  medicines. This is especially important if you are taking diabetes medicines or blood thinners. Taking medicines such as aspirin and ibuprofen. These medicines can thin your blood. Do not take these medicines unless your health care provider tells you to take them. Taking over-the-counter medicines, vitamins, herbs, and supplements. General instructions You may have a physical exam. You may have imaging tests, such as an X-ray, CT scan, or MRI. Follow instructions from your health care provider about eating or drinking restrictions. Ask your health care provider what steps will be taken to help prevent infection. This may include washing skin with a germ-killing soap. Plan to have a responsible adult take you home from the hospital or clinic. What happens during the procedure?  You will be awake during the procedure and may be given one or more of the following: A medicine to help you relax (sedative). A medicine to numb the area (local anesthetic). Your health care provider will inject a local anesthetic into the skin above your SI joint. You will be placed in the proper position on a procedure table to give the health care team the best access to your SI joint. An X-ray machine that produces moving X-ray images (fluoroscopy) will be placed above the procedure table. A long, thin needle will be inserted through your skin and down to your SI joint. The position of the needle will be checked with fluoroscopy imaging. An X-ray dye will be injected to make sure the needle enters the joint space. You may be asked if you feel any pain. Long-acting anesthetic medicine will be injected. Long-acting steroid medicine may also be injected. The needle will be removed, and a bandage will be placed  over the injection site. The procedure may vary among health care providers and hospitals. What happens after the procedure? Your blood pressure, heart rate, breathing rate, and blood oxygen level will be  monitored until you leave the hospital or clinic. Since dye was used, you will be told to drink plenty of water to wash (flush) the dye out of your body. You may be asked if you have pain relief from the injection. You will likely be able to go home shortly after the procedure. Your health care provider will give you instructions for taking care of yourself after the procedure. These may include instructions for doing physical therapy exercises. If you were given a sedative during the procedure, it can affect you for several hours. Do not drive or operate machinery until your health care provider says that it is safe. Summary A sacroiliac (SI) joint injection is a procedure to inject a numbing medicine (anesthetic block)--and sometimes a strong anti-inflammatory medicine (steroid)--into the SI joint. You will be awake during the procedure. You may be given a sedative, a local anesthetic, or both. If you were given a sedative during the procedure, it can affect you for several hours. Do not drive or operate machinery until your health care provider says that it is safe. This information is not intended to replace advice given to you by your health care provider. Make sure you discuss any questions you have with your health care provider. Document Revised: 06/11/2019 Document Reviewed: 06/11/2019 Elsevier Patient Education  2023 [employer].

## 2022-08-05 ENCOUNTER — Other Ambulatory Visit: Payer: Self-pay | Admitting: Family Medicine

## 2022-08-05 DIAGNOSIS — I10 Essential (primary) hypertension: Secondary | ICD-10-CM

## 2022-09-10 ENCOUNTER — Ambulatory Visit: Payer: No Typology Code available for payment source | Admitting: Podiatry

## 2022-09-10 ENCOUNTER — Encounter: Payer: Self-pay | Admitting: Podiatry

## 2022-09-10 ENCOUNTER — Ambulatory Visit (INDEPENDENT_AMBULATORY_CARE_PROVIDER_SITE_OTHER): Payer: No Typology Code available for payment source

## 2022-09-10 DIAGNOSIS — M722 Plantar fascial fibromatosis: Secondary | ICD-10-CM | POA: Diagnosis not present

## 2022-09-10 DIAGNOSIS — M79672 Pain in left foot: Secondary | ICD-10-CM

## 2022-09-10 MED ORDER — MELOXICAM 15 MG PO TABS: 15.0000 mg | ORAL_TABLET | Freq: Every day | ORAL | 0 refills | Status: DC

## 2022-09-10 NOTE — Progress Notes (Signed)
  Subjective:  Patient ID: Colleen Coffey, female    DOB: 08-08-1950,   MRN: [id number]  No chief complaint on file.   72 y.o. female presents for concern of left heel pain that has been going on for about two months. Denies any injury. Relates first pain in the morning or after rest are the most painful. Has tried some inserts with minimal relief. Has been stretching some.  . Denies any other pedal complaints. Denies n/v/f/c.   Past Medical History:  Diagnosis Date   Constipation    chronic, improved with healthy diet   Endocervical polyp    GERD (gastroesophageal reflux disease)    Hemorrhoids    resolved   Hypertension    Osteoarthritis, knee 06/27/2015   -saw [facility]    Osteopenia 06/27/2015   -reports on medication remotely with gyn     Objective:  Physical Exam: Vascular: DP/PT pulses 2/4 bilateral. CFT <3 seconds. Normal hair growth on digits. No edema.  Skin. No lacerations or abrasions bilateral feet.  Musculoskeletal: MMT 5/5 bilateral lower extremities in DF, PF, Inversion and Eversion. Deceased ROM in DF of ankle joint. Tender to medial calcaneal tubercle on the left. No pain with calcaneal squeeze. No pain to PT achilles or arch.  Neurological: Sensation intact to light touch.   Assessment:   1. Plantar fasciitis, left      Plan:  Patient was evaluated and treated and all questions answered. Discussed plantar fasciitis with patient.  X-rays reviewed and discussed with patient. No acute fractures or dislocations noted. Mild spurring noted at inferior calcaneus.  Discussed treatment options including, ice, NSAIDS, supportive shoes, bracing, and stretching. Stretching exercises provided to be done on a daily basis.   Prescription for meloxicam provided and sent to pharmacy. Most recent CMP with kidney function normal.  PF brace dispensed.    Follow-up 6 weeks or sooner if any problems arise. In the meantime, encouraged to call the office with any  questions, concerns, change in symptoms.      Louann Sjogren, DPM

## 2022-09-10 NOTE — Patient Instructions (Signed)

## 2022-09-27 ENCOUNTER — Encounter (INDEPENDENT_AMBULATORY_CARE_PROVIDER_SITE_OTHER): Payer: Self-pay

## 2022-10-07 ENCOUNTER — Other Ambulatory Visit: Payer: Self-pay | Admitting: Podiatry

## 2022-10-22 ENCOUNTER — Encounter: Payer: Self-pay | Admitting: Family Medicine

## 2022-10-22 ENCOUNTER — Ambulatory Visit (INDEPENDENT_AMBULATORY_CARE_PROVIDER_SITE_OTHER): Payer: No Typology Code available for payment source | Admitting: Family Medicine

## 2022-10-22 VITALS — BP 142/80 | HR 90 | Temp 98.5°F | Ht 60.0 in | Wt 165.8 lb

## 2022-10-22 DIAGNOSIS — G4709 Other insomnia: Secondary | ICD-10-CM | POA: Diagnosis not present

## 2022-10-22 DIAGNOSIS — Z780 Asymptomatic menopausal state: Secondary | ICD-10-CM | POA: Diagnosis not present

## 2022-10-22 DIAGNOSIS — R7303 Prediabetes: Secondary | ICD-10-CM | POA: Diagnosis not present

## 2022-10-22 DIAGNOSIS — Z13220 Encounter for screening for lipoid disorders: Secondary | ICD-10-CM | POA: Diagnosis not present

## 2022-10-22 DIAGNOSIS — I10 Essential (primary) hypertension: Secondary | ICD-10-CM

## 2022-10-22 DIAGNOSIS — Z23 Encounter for immunization: Secondary | ICD-10-CM

## 2022-10-22 DIAGNOSIS — F419 Anxiety disorder, unspecified: Secondary | ICD-10-CM | POA: Diagnosis not present

## 2022-10-22 LAB — LIPID PANEL
Cholesterol: 188 mg/dL (ref 0–200)
HDL: 74 mg/dL (ref >39.00)
LDL Cholesterol: 93 mg/dL (ref 0–99)
NonHDL: 113.79
Total CHOL/HDL Ratio: 3
Triglycerides: 105 mg/dL (ref 0.0–149.0)
VLDL: 21 mg/dL (ref 0.0–40.0)

## 2022-10-22 LAB — COMPREHENSIVE METABOLIC PANEL
ALT: 20 U/L (ref 0–35)
AST: 19 U/L (ref 0–37)
Albumin: 4 g/dL (ref 3.5–5.2)
Alkaline Phosphatase: 64 U/L (ref 39–117)
BUN: 15 mg/dL (ref 6–23)
CO2: 29 meq/L (ref 19–32)
Calcium: 9.6 mg/dL (ref 8.4–10.5)
Chloride: 102 meq/L (ref 96–112)
Creatinine, Ser: 0.81 mg/dL (ref 0.40–1.20)
GFR: 72.1 mL/min (ref >60.00)
Glucose, Bld: 97 mg/dL (ref 70–99)
Potassium: 3.9 meq/L (ref 3.5–5.1)
Sodium: 139 meq/L (ref 135–145)
Total Bilirubin: 0.5 mg/dL (ref 0.2–1.2)
Total Protein: 7.7 g/dL (ref 6.0–8.3)

## 2022-10-22 LAB — HEMOGLOBIN A1C: Hgb A1c MFr Bld: 5.6 % (ref 4.6–6.5)

## 2022-10-22 LAB — TSH: TSH: 1.68 u[IU]/mL (ref 0.35–5.50)

## 2022-10-22 MED ORDER — TRAZODONE HCL 50 MG PO TABS: 25.0000 mg | ORAL_TABLET | Freq: Every evening | ORAL | 3 refills | Status: DC | PRN

## 2022-10-22 NOTE — Progress Notes (Signed)
Established Patient Office Visit  Subjective   Patient ID: Colleen Coffey, female    DOB: [birth year]  Age: 72 y.o. MRN: [id number]  Chief Complaint  Patient presents with   Medical Management of Chronic Issues    Patient is here for 6 months follow up.   HTN-- pt reports she has been taking her medication as prescribed. BP is elevated today. She reports she is under some stress with her daughter and her family. No chest pain, no dizziness or headaches reported today. She has not really been checking her BP at home but she does have a BP machine.   PreDM-- pt reports she is feeling well, no changes in vision. Se is due for repeat A1C today.   Anxiety-- reviewed GAD score with the patient, has been getting worse over the last 3 weeks. States that there is a situation with her daughter that is causing a lot of anxiety and stress. Pt is not sleeping, has tried melatonin but it is not working for her. Feels like her mind is racing at night. She reports she does have some coping skills, usually she colors or takes a walk when the anxiety is more severe.       10/22/2022    8:25 AM  GAD 7 : Generalized Anxiety Score  Nervous, Anxious, on Edge 3  Control/stop worrying 3  Worry too much - different things 3  Trouble relaxing 3  Restless 1  Easily annoyed or irritable 2  Afraid - awful might happen 2  Total GAD 7 Score 17  Anxiety Difficulty Not difficult at all     Flowsheet Row Office Visit from 10/22/2022 in [facility] [place]  PHQ-9 Total Score 4       Current Outpatient Medications  Medication Instructions   acetaminophen (TYLENOL) 650 mg, Oral, Every 6 hours PRN   amLODipine (NORVASC) 10 mg, Oral, Daily   Cholecalciferol (VITAMIN D3 PO) 1,000 Units, Oral, Daily   diclofenac (VOLTAREN) 75 mg, Oral, 2 times daily   Magnesium Hydroxide (DULCOLAX PO) Oral   meloxicam (MOBIC) 15 mg, Oral, Daily   METAMUCIL FIBER PO 1 Dose, Oral, Daily   Multiple  Vitamins-Minerals (ZINC PO) 50 mg, Oral, Daily   polyethylene glycol (MIRALAX) 17 g, Oral, Daily   traZODone (DESYREL) 25-50 mg, Oral, At bedtime PRN    Patient Active Problem List   Diagnosis Date Noted   Trochanteric bursitis of both hips 07/05/2022   Postlaminectomy syndrome, lumbar region 07/05/2022   Sacroiliac dysfunction 07/05/2022   Muscle pain 10/19/2021   Prediabetes 10/19/2021   Hypertension 04/27/2020   Osteoarthritis, knee 06/27/2015   BMI 28.0-28.9,adult 06/27/2015   Constipation 06/27/2015   Osteopenia 06/27/2015      Review of Systems  All other systems reviewed and are negative.     Objective:     BP (!) 142/80 (BP Location: Right Arm, Patient Position: Sitting, Cuff Size: Large)   Pulse 90   Temp 98.5 F (36.9 C) (Oral)   Ht 5' (1.524 m)   Wt 165 lb 12.8 oz (75.2 kg)   LMP 01/11/2017 (Exact Date)   SpO2 99%   BMI 32.38 kg/m    Physical Exam Vitals reviewed.  Constitutional:      Appearance: Normal appearance. She is well-groomed and normal weight.  Eyes:     Conjunctiva/sclera: Conjunctivae normal.  Neck:     Thyroid: No thyromegaly.  Cardiovascular:     Rate and Rhythm: Normal rate and regular  rhythm.     Pulses: Normal pulses.     Heart sounds: S1 normal and S2 normal.  Pulmonary:     Effort: Pulmonary effort is normal.     Breath sounds: Normal breath sounds and air entry.  Abdominal:     General: Bowel sounds are normal.  Musculoskeletal:     Right lower leg: No edema.     Left lower leg: No edema.  Neurological:     Mental Status: She is alert and oriented to person, place, and time. Mental status is at baseline.     Gait: Gait is intact.  Psychiatric:        Mood and Affect: Mood and affect normal.        Speech: Speech normal.        Behavior: Behavior normal.        Judgment: Judgment normal.      No results found for any visits on 10/22/22.    The 10-year ASCVD risk score (Arnett DK, et al., 2019) is: 17.3%     Assessment & Plan:  Primary hypertension Assessment & Plan: Chronic, On amlodipine 10 mg at bedtime. She is having more anxiety/stress (GAD score is 17) so this is likely causing the elevation in the BP. I recommended that she check her BP at home daily. Handouts given on proper BP checking technique. If her BP remains elevated then we may need to consider adding another medication, this was discussed with the patient.   Orders: -     Comprehensive metabolic panel  Prediabetes Assessment & Plan: Chronic, Currently diet controlled, needs new a1c today  Orders: -     Hemoglobin A1c  Lipid screening -     Lipid panel  Postmenopausal state -     DG Bone Density; Future  Other insomnia - Acute, likely due to increasing anxiety, counseled patient on sleep hygiene and we discussed adding a sleep aid to take PRN, she is agreeable to starting trazodone 25 mg as needed at bedtime.   -     traZODone HCl; Take 0.5-1 tablets (25-50 mg total) by mouth at bedtime as needed for sleep.  Dispense: 30 tablet; Refill: 3  Anxiety - Acute, new onset due to family situation. Checking TSH today and counseled patient that if her anxiety becomes chronic then we may need to discuss medication like antidepressant for treatment.   -     TSH  Need for immunization against influenza -     Flu Vaccine Trivalent High Dose (Fluad)     Return in about 6 months (around 04/21/2023) for annual physical exam.    Karie Georges, MD

## 2022-10-22 NOTE — Assessment & Plan Note (Signed)
On amlodipine 10 mg at bedtime. She is having more anxiety/stress (GAD score is 17) so this is likely causing the elevation in the BP. I recommended that she check her BP at home daily. Handouts given on proper BP checking technique. If her BP remains elevated then we may need to consider adding another medication, this was discussed with the patient.

## 2022-10-22 NOTE — Patient Instructions (Addendum)
Send me a message on Mychart to let me know what your blood pressure readings are at home.   Ketoconazole cream (available over the counter) or nizoral shampoo

## 2022-10-22 NOTE — Assessment & Plan Note (Signed)
Currently diet controlled, needs new a1c today

## 2022-10-23 ENCOUNTER — Encounter: Payer: Self-pay | Admitting: Podiatry

## 2022-10-23 ENCOUNTER — Ambulatory Visit (INDEPENDENT_AMBULATORY_CARE_PROVIDER_SITE_OTHER): Payer: No Typology Code available for payment source | Admitting: Podiatry

## 2022-10-23 DIAGNOSIS — M722 Plantar fascial fibromatosis: Secondary | ICD-10-CM | POA: Diagnosis not present

## 2022-10-23 MED ORDER — DEXAMETHASONE SODIUM PHOSPHATE 120 MG/30ML IJ SOLN
4.0000 mg | Freq: Once | INTRAMUSCULAR | Status: AC
  Administered 2022-10-23: 4 mg via INTRA_ARTICULAR

## 2022-10-23 MED ORDER — MELOXICAM 15 MG PO TABS: 15.0000 mg | ORAL_TABLET | Freq: Every day | ORAL | 0 refills | Status: DC

## 2022-10-23 MED ORDER — TRIAMCINOLONE ACETONIDE 10 MG/ML IJ SUSP
2.5000 mg | Freq: Once | INTRAMUSCULAR | Status: AC
  Administered 2022-10-23: 2.5 mg via INTRA_ARTICULAR

## 2022-10-23 NOTE — Progress Notes (Signed)
  Subjective:  Patient ID: Colleen Coffey, female    DOB: [birth year],   MRN: [id number]  No chief complaint on file.   72 y.o. female presents for follow-up of left plantar fasciitis. Relates she is about 85% better and has been wearing brace and meloxicam helping. She has been stretching. Relates she would like an injection to help some more as she has a trip coming up. . Denies any other pedal complaints. Denies n/v/f/c.   Past Medical History:  Diagnosis Date   Constipation    chronic, improved with healthy diet   Endocervical polyp    GERD (gastroesophageal reflux disease)    Hemorrhoids    resolved   Hypertension    Osteoarthritis, knee 06/27/2015   -saw [facility]    Osteopenia 06/27/2015   -reports on medication remotely with gyn     Objective:  Physical Exam: Vascular: DP/PT pulses 2/4 bilateral. CFT <3 seconds. Normal hair growth on digits. No edema.  Skin. No lacerations or abrasions bilateral feet.  Musculoskeletal: MMT 5/5 bilateral lower extremities in DF, PF, Inversion and Eversion. Deceased ROM in DF of ankle joint. Tender to medial calcaneal tubercle on the left. No pain with calcaneal squeeze. No pain to PT achilles or arch.  Neurological: Sensation intact to light touch.   Assessment:   1. Plantar fasciitis, left      Plan:  Patient was evaluated and treated and all questions answered. Discussed plantar fasciitis with patient.  X-rays reviewed and discussed with patient. No acute fractures or dislocations noted. Mild spurring noted at inferior calcaneus.  Discussed treatment options including, ice, NSAIDS, supportive shoes, bracing, and stretching. Cotninue stretching and brace.  Meloixicam refilled.  Injection provided procedure below.  Follow-up 6 weeks or sooner if any problems arise. In the meantime, encouraged to call the office with any questions, concerns, change in symptoms.    Procedure: Injection Tendon/Ligament Discussed  alternatives, risks, complications and verbal consent was obtained.  Location: Left plantar fascia . Skin Prep: Alcohol. Injectate: 1cc 0.5% marcaine plain, 1 cc dexamethasone 0.5 cc kenalog  Disposition: Patient tolerated procedure well. Injection site dressed with a band-aid.  Post-injection care was discussed and return precautions discussed.     Louann Sjogren, DPM

## 2022-10-25 ENCOUNTER — Telehealth: Payer: Self-pay | Admitting: Podiatry

## 2022-10-25 NOTE — Telephone Encounter (Signed)
Pt called and checked with her insurance and they are stating it would be covered at 80% but it would need a prior auth. The number to them is [phone]

## 2022-10-30 ENCOUNTER — Encounter: Payer: Self-pay | Admitting: Family Medicine

## 2022-10-30 DIAGNOSIS — I10 Essential (primary) hypertension: Secondary | ICD-10-CM

## 2022-10-31 ENCOUNTER — Ambulatory Visit: Payer: No Typology Code available for payment source | Admitting: Gastroenterology

## 2022-11-01 ENCOUNTER — Encounter: Payer: Self-pay | Admitting: Family Medicine

## 2022-11-01 ENCOUNTER — Other Ambulatory Visit: Payer: Self-pay

## 2022-11-01 DIAGNOSIS — E2839 Other primary ovarian failure: Secondary | ICD-10-CM

## 2022-11-01 MED ORDER — HYDROCHLOROTHIAZIDE 12.5 MG PO TABS: 12.5000 mg | ORAL_TABLET | Freq: Every day | ORAL | 0 refills | Status: DC

## 2022-11-01 NOTE — Progress Notes (Signed)
11/01/2022  Patient ID: Colleen Coffey, female   DOB: Feb 17, 1950, 72 y.o.   MRN: [id number]  Pharmacy Quality Measure Review  This patient is appearing on a report for being at risk of failing the Controlling Blood Pressure measure this calendar year.   Last documented BP 142/80 on 10/22/22.  Patient reports she has been checking her BP twice daily and was 128/68 today.   Documented home reading and confirmed patient's BP medications. Reminded of PCP appt in October.  Sherrill Raring, PharmD Clinical Pharmacist [phone]

## 2022-11-02 NOTE — Telephone Encounter (Signed)
Ok to change the order to estrogen deficiency, thanks!

## 2022-11-02 NOTE — Addendum Note (Signed)
Addended by: Johnella Moloney on: 11/02/2022 01:44 PM   Modules accepted: Orders

## 2022-11-02 NOTE — Telephone Encounter (Signed)
Pt is calling and bone density order need to be changed per pt to estrogen deficiency so that insurance will pay.

## 2022-11-02 NOTE — Telephone Encounter (Signed)
Noted  

## 2022-11-06 NOTE — Addendum Note (Signed)
Addended by: Johnella Moloney on: 11/06/2022 04:52 PM   Modules accepted: Orders

## 2022-11-06 NOTE — Telephone Encounter (Signed)
Orders were cancelled as below.

## 2022-11-06 NOTE — Telephone Encounter (Signed)
Please cancel the DEXA order-- she had one last year already

## 2022-11-13 ENCOUNTER — Other Ambulatory Visit: Payer: Self-pay | Admitting: Family Medicine

## 2022-11-13 DIAGNOSIS — G4709 Other insomnia: Secondary | ICD-10-CM

## 2022-12-01 ENCOUNTER — Other Ambulatory Visit: Payer: Self-pay | Admitting: Podiatry

## 2022-12-10 ENCOUNTER — Ambulatory Visit (INDEPENDENT_AMBULATORY_CARE_PROVIDER_SITE_OTHER): Payer: No Typology Code available for payment source

## 2022-12-10 VITALS — Ht 60.0 in | Wt 165.0 lb

## 2022-12-10 DIAGNOSIS — Z0000 Encounter for general adult medical examination without abnormal findings: Secondary | ICD-10-CM | POA: Diagnosis not present

## 2022-12-10 NOTE — Patient Instructions (Addendum)
Colleen Coffey , Thank you for taking time to come for your Medicare Wellness Visit. I appreciate your ongoing commitment to your health goals. Please review the following plan we discussed and let me know if I can assist you in the future.   Referrals/Orders/Follow-Ups/Clinician Recommendations:   This is a list of the screening recommended for you and due dates:  Health Maintenance  Topic Date Due   COVID-19 Vaccine (7 - 2023-24 season) 12/11/2022   Medicare Annual Wellness Visit  12/10/2023   Mammogram  02/14/2024   Colon Cancer Screening  08/09/2029   DTaP/Tdap/Td vaccine (4 - Tdap) 09/24/2029   Pneumonia Vaccine  Completed   Flu Shot  Completed   DEXA scan (bone density measurement)  Completed   Hepatitis C Screening  Completed   Zoster (Shingles) Vaccine  Completed   HPV Vaccine  Aged Out   Cologuard (Stool DNA test)  Discontinued    Advanced directives: (Copy Requested) Please bring a copy of your health care power of attorney and living will to the office to be added to your chart at your convenience.  Next Medicare Annual Wellness Visit scheduled for next year: Yes

## 2022-12-10 NOTE — Progress Notes (Signed)
Subjective:   Colleen Coffey is a 72 y.o. female who presents for Medicare Annual (Subsequent) preventive examination.  Visit Complete: Virtual I connected with  Colleen Coffey on 12/10/22 by a audio enabled telemedicine application and verified that I am speaking with the correct person using two identifiers.  Patient Location: Home  Provider Location: Home Office  I discussed the limitations of evaluation and management by telemedicine. The patient expressed understanding and agreed to proceed.  Vital Signs: Because this visit was a virtual/telehealth visit, some criteria may be missing or patient reported. Any vitals not documented were not able to be obtained and vitals that have been documented are patient reported.  Patient Medicare AWV questionnaire was completed by the patient on 12/06/22; I have confirmed that all information answered by patient is correct and no changes since this date.  Cardiac Risk Factors include: advanced age (>84men, >58 women);hypertension     Objective:    Today's Vitals   12/10/22 1554  Weight: 165 lb (74.8 kg)  Height: 5' (1.524 m)   Body mass index is 32.22 kg/m.     12/10/2022    4:00 PM 12/27/2021   10:20 AM 12/05/2021   11:15 AM 11/22/2020   11:08 AM 11/15/2020   11:41 PM 11/17/2019   11:02 AM 06/04/2017   10:57 AM  Advanced Directives  Does Patient Have a Medical Advance Directive? Yes Yes Yes Yes No Yes Yes  Type of Estate agent of [place];Living will Healthcare Power of [place];Living will Healthcare Power of [place];Living will Living will  Healthcare Power of [place];Living will Healthcare Power of [place];Living will  Does patient want to make changes to medical advance directive?  No - Patient declined       Copy of Healthcare Power of Attorney in Chart? No - copy requested No - copy requested No - copy requested   No - copy requested   Would patient like information on creating a medical  advance directive?     No - Patient declined      Current Medications (verified) Outpatient Encounter Medications as of 12/10/2022  Medication Sig   acetaminophen (TYLENOL) 325 MG tablet Take 2 tablets (650 mg total) by mouth every 6 (six) hours as needed for mild pain (or temp > 100).   amLODipine (NORVASC) 10 MG tablet TAKE 1 TABLET BY MOUTH EVERY DAY   Cholecalciferol (VITAMIN D3 PO) Take 1,000 Units by mouth daily.   hydrochlorothiazide (HYDRODIURIL) 12.5 MG tablet Take 1 tablet (12.5 mg total) by mouth daily.   Magnesium Hydroxide (DULCOLAX PO) Take by mouth.   meloxicam (MOBIC) 15 MG tablet TAKE 1 TABLET (15 MG TOTAL) BY MOUTH DAILY.   METAMUCIL FIBER PO Take 1 Dose by mouth daily.   Multiple Vitamins-Minerals (ZINC PO) Take 50 mg by mouth daily.   polyethylene glycol (MIRALAX) 17 g packet Take 17 g by mouth daily.   traZODone (DESYREL) 50 MG tablet Take 0.5-1 tablets (25-50 mg total) by mouth at bedtime as needed for sleep.   No facility-administered encounter medications on file as of 12/10/2022.    Allergies (verified) Patient has no known allergies.   History: Past Medical History:  Diagnosis Date   Constipation    chronic, improved with healthy diet   Endocervical polyp    GERD (gastroesophageal reflux disease)    Hemorrhoids    resolved   Hypertension    Osteoarthritis, knee 06/27/2015   -saw [facility]    Osteopenia 06/27/2015   -  reports on medication remotely with gyn    Past Surgical History:  Procedure Laterality Date   APPENDECTOMY     BACK SURGERY  2014   bulging disc lumbar spine   BREAST CYST ASPIRATION Right    CESAREAN SECTION     2   CHOLECYSTECTOMY N/A 11/16/2020   Procedure: LAPAROSCOPIC CHOLECYSTECTOMY;  Surgeon: Griselda Miner, MD;  Location: [facility] OR;  Service: General;  Laterality: N/A;   Family History  Problem Relation Age of Onset   High blood pressure Mother    High Cholesterol Mother    Dementia Mother    Arthritis Mother     Heart disease Mother    Syncope episode Mother    High blood pressure Father    COPD Father    High blood pressure Sister    Parkinson's disease Sister    Arthritis Sister    High blood pressure Maternal Grandmother    High Cholesterol Maternal Grandmother    Dementia Maternal Grandmother    Arthritis Maternal Grandmother    COPD Brother    High blood pressure Brother    Hyperlipidemia Other    Hypertension Other    Depression Other    Esophageal cancer Neg Hx    Colon cancer Neg Hx    Pancreatic cancer Neg Hx    Stomach cancer Neg Hx    Liver disease Neg Hx    Breast cancer Neg Hx    Social History   Socioeconomic History   Marital status: Widowed    Spouse name: Not on file   Number of children: 2   Years of education: Not on file   Highest education level: Some college, no degree  Occupational History   Occupation: Retired  Tobacco Use   Smoking status: Never   Smokeless tobacco: Never  Vaping Use   Vaping status: Never Used  Substance and Sexual Activity   Alcohol use: No   Drug use: No   Sexual activity: Never    Birth control/protection: Post-menopausal  Other Topics Concern   Not on file  Social History Narrative   Work or School: retired, used to be a Merchandiser, retail for [employer]      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; as a Software engineer; husband and mother passed in 2013      Spiritual Beliefs: Christian      Lifestyle: exercising; eating healthy      Social Determinants of Health   Financial Resource Strain: Low Risk  (12/06/2022)   Overall Financial Resource Strain (CARDIA)    Difficulty of Paying Living Expenses: Not very hard  Food Insecurity: No Food Insecurity (12/06/2022)   Hunger Vital Sign    Worried About Running Out of Food in the Last Year: Never true    Ran Out of Food in the Last Year: Never true  Transportation Needs: No Transportation Needs (12/06/2022)   PRAPARE - Administrator, Civil Service (Medical): No    Lack of  Transportation (Non-Medical): No  Physical Activity: Insufficiently Active (12/06/2022)   Exercise Vital Sign    Days of Exercise per Week: 3 days    Minutes of Exercise per Session: 30 min  Stress: No Stress Concern Present (12/06/2022)   [employer] of Occupational Health - Occupational Stress Questionnaire    Feeling of Stress : Only a little  Recent Concern: Stress - Stress Concern Present (10/18/2022)   [employer] of Occupational Health - Occupational Stress Questionnaire    Feeling of Stress :  To some extent  Social Connections: Moderately Integrated (12/06/2022)   Social Connection and Isolation Panel [NHANES]    Frequency of Communication with Friends and Family: More than three times a week    Frequency of Social Gatherings with Friends and Family: Three times a week    Attends Religious Services: More than 4 times per year    Active Member of Clubs or Organizations: Yes    Attends Banker Meetings: More than 4 times per year    Marital Status: Widowed    Tobacco Counseling Counseling given: Not Answered   Clinical Intake:  Pre-visit preparation completed: Yes  Pain : No/denies pain     BMI - recorded: 32.22 Nutritional Status: BMI > 30  Obese Nutritional Risks: None Diabetes: No  How often do you need to have someone help you when you read instructions, pamphlets, or other written materials from your doctor or pharmacy?: 1 - Never  Interpreter Needed?: No  Information entered by :: Theresa Mulligan LPN   Activities of Daily Living    12/06/2022   10:33 AM  In your present state of health, do you have any difficulty performing the following activities:  Hearing? 0  Vision? 0  Difficulty concentrating or making decisions? 0  Walking or climbing stairs? 0  Dressing or bathing? 0  Doing errands, shopping? 0  Preparing Food and eating ? N  Using the Toilet? N  In the past six months, have you accidently leaked urine? N  Do you have  problems with loss of bowel control? N  Managing your Medications? N  Managing your Finances? N  Housekeeping or managing your Housekeeping? N    Patient Care Team: Karie Georges, MD as PCP - General (Family Medicine)  Indicate any recent Medical Services you may have received from other than [facility] providers in the past year (date may be approximate).     Assessment:   This is a routine wellness examination for [place].  Hearing/Vision screen Hearing Screening - Comments:: Denies hearing difficulties   Vision Screening - Comments:: Wears reading  glasses - up to date with routine eye exams with  [facility]   Goals Addressed               This Visit's Progress     Increase physical activity (pt-stated)        Lose weight and stay healthy.       Depression Screen    12/10/2022    4:02 PM 10/22/2022    8:24 AM 07/05/2022   10:59 AM 05/24/2022   11:29 AM 04/26/2022   11:27 AM 04/18/2022    9:16 AM 01/30/2022   10:34 AM  PHQ 2/9 Scores  PHQ - 2 Score 0 1 0 0 0 0 0  PHQ- 9 Score 0 4   7 0 5    Fall Risk    12/10/2022    3:59 PM 12/06/2022   10:33 AM 10/22/2022    8:25 AM 10/18/2022    8:54 AM 07/05/2022   10:59 AM  Fall Risk   Falls in the past year? 0 0 0 0 0  Number falls in past yr: 0 0 0  0  Injury with Fall? 0 0 0  0  Risk for fall due to : No Fall Risks No Fall Risks No Fall Risks    Follow up Falls prevention discussed Falls prevention discussed Falls evaluation completed      MEDICARE RISK AT HOME: Medicare  Risk at Home Any stairs in or around the home?: Yes If so, are there any without handrails?: No Home free of loose throw rugs in walkways, pet beds, electrical cords, etc?: Yes Adequate lighting in your home to reduce risk of falls?: Yes Life alert?: Yes Use of a cane, walker or w/c?: No Grab bars in the bathroom?: No Shower chair or bench in shower?: Yes Elevated toilet seat or a handicapped toilet?: Yes  TIMED UP AND GO:  Was the test  performed?  No    Cognitive Function:        12/10/2022    4:00 PM 12/05/2021   11:18 AM 11/22/2020   11:11 AM 11/17/2019   11:07 AM  6CIT Screen  What Year? 0 points 0 points 0 points 0 points  What month? 0 points 0 points 0 points 0 points  What time? 0 points 0 points 0 points   Count back from 20 0 points 0 points 0 points 0 points  Months in reverse 0 points 0 points 0 points 0 points  Repeat phrase 0 points 2 points 0 points 2 points  Total Score 0 points 2 points 0 points     Immunizations Immunization History  Administered Date(s) Administered   Fluad Quad(high Dose 65+) 10/16/2018, 10/20/2021   Fluad Trivalent(High Dose 65+) 10/22/2022   Influenza Split 11/15/2010, 11/08/2011   Influenza Whole 11/26/2006, 11/25/2007, 11/17/2008, 11/02/2009   Influenza, High Dose Seasonal PF 10/21/2014, 10/27/2015, 10/25/2016, 10/10/2017   Influenza-Unspecified 10/13/2013, 10/29/2019, 10/13/2020   Moderna Covid-19 Vaccine Bivalent Booster 76yrs & up 10/16/2022   PFIZER(Purple Top)SARS-COV-2 Vaccination 03/21/2019, 04/11/2019, 11/13/2019, 10/20/2020   PNEUMOCOCCAL CONJUGATE-20 04/28/2021   Pfizer Covid-19 Vaccine Bivalent Booster 35yrs & up 11/09/2021   Pneumococcal Conjugate-13 12/16/2014   Pneumococcal Polysaccharide-23 10/04/2016   Respiratory Syncytial Virus Vaccine,Recomb Aduvanted(Arexvy) 11/16/2021   Td 02/12/1998, 01/04/2009, 09/25/2019   Zoster Recombinant(Shingrix) 06/23/2019, 09/02/2019   Zoster, Live 02/19/2014    TDAP status: Up to date  Flu Vaccine status: Up to date  Pneumococcal vaccine status: Up to date  Covid-19 vaccine status: Declined, Education has been provided regarding the importance of this vaccine but patient still declined. Advised may receive this vaccine at local pharmacy or Health Dept.or vaccine clinic. Aware to provide a copy of the vaccination record if obtained from local pharmacy or Health Dept. Verbalized acceptance and  understanding.  Qualifies for Shingles Vaccine? Yes   Zostavax completed Yes   Shingrix Completed?: Yes  Screening Tests Health Maintenance  Topic Date Due   COVID-19 Vaccine (7 - 2023-24 season) 12/11/2022   Medicare Annual Wellness (AWV)  12/10/2023   MAMMOGRAM  02/14/2024   Colonoscopy  08/09/2029   DTaP/Tdap/Td (4 - Tdap) 09/24/2029   Pneumonia Vaccine 65+ Years old  Completed   INFLUENZA VACCINE  Completed   DEXA SCAN  Completed   Hepatitis C Screening  Completed   Zoster Vaccines- Shingrix  Completed   HPV VACCINES  Aged Out   Fecal DNA (Cologuard)  Discontinued    Health Maintenance  Health Maintenance Due  Topic Date Due   COVID-19 Vaccine (7 - 2023-24 season) 12/11/2022      Mammogram status: Completed 02/13/22. Repeat every year  Bone Density status: Completed 11/14/21. Results reflect: Bone density results: OSTEOPENIA. Repeat every   years.    Additional Screening:  Hepatitis C Screening: does qualify; Completed 10/05/26  Vision Screening: Recommended annual ophthalmology exams for early detection of glaucoma and other disorders of the eye. Is the patient up  to date with their annual eye exam?  Yes  Who is the provider or what is the name of the office in which the patient attends annual eye exams? [facility] If pt is not established with a provider, would they like to be referred to a provider to establish care? No .   Dental Screening: Recommended annual dental exams for proper oral hygiene    Community Resource Referral / Chronic Care Management:  CRR required this visit?  No   CCM required this visit?  No     Plan:     I have personally reviewed and noted the following in the patient's chart:   Medical and social history Use of alcohol, tobacco or illicit drugs  Current medications and supplements including opioid prescriptions. Patient is not currently taking opioid prescriptions. Functional ability and status Nutritional  status Physical activity Advanced directives List of other physicians Hospitalizations, surgeries, and ER visits in previous 12 months Vitals Screenings to include cognitive, depression, and falls Referrals and appointments  In addition, I have reviewed and discussed with patient certain preventive protocols, quality metrics, and best practice recommendations. A written personalized care plan for preventive services as well as general preventive health recommendations were provided to patient.     Tillie Rung, LPN   [id number]   After Visit Summary: (MyChart) Due to this being a telephonic visit, the after visit summary with patients personalized plan was offered to patient via MyChart   Nurse Notes: None

## 2022-12-12 NOTE — Telephone Encounter (Signed)
Ok to refill 90 day supply

## 2022-12-12 NOTE — Telephone Encounter (Signed)
Rx phoned in to [facility] as below due to transmission failure.

## 2022-12-26 DIAGNOSIS — H40003 Preglaucoma, unspecified, bilateral: Secondary | ICD-10-CM | POA: Diagnosis not present

## 2022-12-26 DIAGNOSIS — H5201 Hypermetropia, right eye: Secondary | ICD-10-CM | POA: Diagnosis not present

## 2022-12-26 DIAGNOSIS — H52223 Regular astigmatism, bilateral: Secondary | ICD-10-CM | POA: Diagnosis not present

## 2022-12-26 DIAGNOSIS — H524 Presbyopia: Secondary | ICD-10-CM | POA: Diagnosis not present

## 2022-12-31 ENCOUNTER — Other Ambulatory Visit: Payer: Self-pay | Admitting: Family Medicine

## 2022-12-31 DIAGNOSIS — Z1231 Encounter for screening mammogram for malignant neoplasm of breast: Secondary | ICD-10-CM

## 2023-01-01 ENCOUNTER — Other Ambulatory Visit: Payer: Self-pay | Admitting: Podiatry

## 2023-01-14 ENCOUNTER — Telehealth: Payer: Self-pay | Admitting: *Deleted

## 2023-01-14 NOTE — Telephone Encounter (Signed)
Please encourage patient to stay hydrated-- it could be a GI bug or other etiology. If she has worsening symptoms, severe pain or vomiting then I would encourage her to be checked out at the urgent care.

## 2023-01-14 NOTE — Telephone Encounter (Signed)
Spoke with the patient and informed her of the message below.  Patient stated she is feeling better and diarrhea has resolved.

## 2023-01-14 NOTE — Telephone Encounter (Signed)
Access Nurse note received via fax from 01/11/2023 at 9:03am [facility] stating the patient complains of diarrhea, fatigue, decreased appetite with negative covid test.  Disposition advised: home care and to call the office if worse or if symptoms of dehydration occur.  I called the patient to follow up and left a detailed message to call the office if needed.  Fax given to PCP.

## 2023-01-17 ENCOUNTER — Other Ambulatory Visit: Payer: Self-pay | Admitting: Family Medicine

## 2023-01-17 ENCOUNTER — Other Ambulatory Visit: Payer: Self-pay | Admitting: Podiatry

## 2023-01-17 DIAGNOSIS — I10 Essential (primary) hypertension: Secondary | ICD-10-CM

## 2023-01-26 ENCOUNTER — Other Ambulatory Visit: Payer: Self-pay | Admitting: Family Medicine

## 2023-01-26 DIAGNOSIS — I10 Essential (primary) hypertension: Secondary | ICD-10-CM

## 2023-02-18 ENCOUNTER — Ambulatory Visit: Payer: No Typology Code available for payment source

## 2023-02-25 ENCOUNTER — Other Ambulatory Visit: Payer: Self-pay | Admitting: Podiatry

## 2023-03-07 ENCOUNTER — Ambulatory Visit
Admission: RE | Admit: 2023-03-07 | Discharge: 2023-03-07 | Disposition: A | Discharge status: Home / Self Care | Payer: No Typology Code available for payment source | Source: Ambulatory Visit | Attending: Family Medicine | Admitting: Family Medicine

## 2023-03-07 DIAGNOSIS — Z1231 Encounter for screening mammogram for malignant neoplasm of breast: Secondary | ICD-10-CM

## 2023-03-08 ENCOUNTER — Encounter: Payer: Self-pay | Admitting: Family Medicine

## 2023-03-09 ENCOUNTER — Other Ambulatory Visit: Payer: Self-pay | Admitting: Family Medicine

## 2023-03-09 DIAGNOSIS — G4709 Other insomnia: Secondary | ICD-10-CM

## 2023-03-25 ENCOUNTER — Other Ambulatory Visit: Payer: Self-pay | Admitting: Podiatry

## 2023-04-01 DIAGNOSIS — Q141 Congenital malformation of retina: Secondary | ICD-10-CM | POA: Diagnosis not present

## 2023-04-01 DIAGNOSIS — H40023 Open angle with borderline findings, high risk, bilateral: Secondary | ICD-10-CM | POA: Diagnosis not present

## 2023-04-01 DIAGNOSIS — H1045 Other chronic allergic conjunctivitis: Secondary | ICD-10-CM | POA: Diagnosis not present

## 2023-04-22 ENCOUNTER — Ambulatory Visit (INDEPENDENT_AMBULATORY_CARE_PROVIDER_SITE_OTHER): Payer: No Typology Code available for payment source | Admitting: Family Medicine

## 2023-04-22 ENCOUNTER — Other Ambulatory Visit: Payer: Self-pay | Admitting: Podiatry

## 2023-04-22 ENCOUNTER — Encounter: Payer: Self-pay | Admitting: Family Medicine

## 2023-04-22 VITALS — BP 132/72 | HR 80 | Temp 97.8°F | Ht 60.0 in | Wt 178.9 lb

## 2023-04-22 DIAGNOSIS — Z0000 Encounter for general adult medical examination without abnormal findings: Secondary | ICD-10-CM

## 2023-04-22 NOTE — Patient Instructions (Signed)

## 2023-04-22 NOTE — Progress Notes (Signed)
 Complete physical exam  Patient: Colleen Coffey   DOB: [birth year]   73 y.o. Female  MRN: [id number]  Subjective:    Chief Complaint  Patient presents with   Annual Exam    Colleen Coffey is a 73 y.o. female who presents today for a complete physical exam. She reports consuming a general diet. Home exercise routine includes walking 1.5 hrs per week and exercise bike at home. She generally feels well. She reports sleeping fairly well. States she is getting 6 hours at night mostly, occasionally having random awakenings early morning. She does not have additional problems to discuss today.    Most recent fall risk assessment:    04/22/2023    8:43 AM  Fall Risk   Falls in the past year? 0  Number falls in past yr: 0  Injury with Fall? 0  Risk for fall due to : No Fall Risks  Follow up Falls evaluation completed     Most recent depression screenings:    04/22/2023    8:43 AM 12/10/2022    4:02 PM  PHQ 2/9 Scores  PHQ - 2 Score 0 0  PHQ- 9 Score 0 0  Pt reports that her stress levels have improved since the last visit.   Vision:Within last year and [place], checking for glaucoma and Dental: No current dental problems and Receives regular dental care  Patient Active Problem List   Diagnosis Date Noted   Trochanteric bursitis of both hips 07/05/2022   Postlaminectomy syndrome, lumbar region 07/05/2022   Sacroiliac dysfunction 07/05/2022   Muscle pain 10/19/2021   Prediabetes 10/19/2021   Hypertension 04/27/2020   Osteoarthritis, knee 06/27/2015   BMI 28.0-28.9,adult 06/27/2015   Constipation 06/27/2015   Osteopenia 06/27/2015      Patient Care Team: Karie Georges, MD as PCP - General (Family Medicine)   Outpatient Medications Prior to Visit  Medication Sig   acetaminophen (TYLENOL) 325 MG tablet Take 2 tablets (650 mg total) by mouth every 6 (six) hours as needed for mild pain (or temp > 100).   amLODipine (NORVASC) 10 MG tablet TAKE 1 TABLET BY  MOUTH EVERY DAY   Cholecalciferol (VITAMIN D3 PO) Take 1,000 Units by mouth daily.   hydrochlorothiazide (HYDRODIURIL) 12.5 MG tablet TAKE 1 TABLET BY MOUTH EVERY DAY   Magnesium Hydroxide (DULCOLAX PO) Take by mouth.   meloxicam (MOBIC) 15 MG tablet TAKE 1 TABLET (15 MG TOTAL) BY MOUTH DAILY.   METAMUCIL FIBER PO Take 1 Dose by mouth daily.   Multiple Vitamins-Minerals (ZINC PO) Take 50 mg by mouth daily.   polyethylene glycol (MIRALAX) 17 g packet Take 17 g by mouth daily.   traZODone (DESYREL) 50 MG tablet TAKE 1/2 TO 1 TABLET AT BEDTIME AS NEEDED SLEEP   No facility-administered medications prior to visit.    Review of Systems  HENT:  Negative for hearing loss.   Eyes:  Negative for blurred vision.  Respiratory:  Negative for shortness of breath.   Cardiovascular:  Negative for chest pain.  Gastrointestinal: Negative.   Genitourinary: Negative.   Musculoskeletal:  Negative for back pain.  Neurological:  Negative for headaches.  Psychiatric/Behavioral:  Negative for depression.        Objective:     BP 132/72   Pulse 80   Temp 97.8 F (36.6 C) (Oral)   Ht 5' (1.524 m)   Wt 178 lb 14.4 oz (81.1 kg)   LMP 01/11/2017 (Exact Date)   SpO2 98%  BMI 34.94 kg/m    Physical Exam Vitals reviewed.  Constitutional:      Appearance: Normal appearance. She is well-groomed. She is obese.  HENT:     Right Ear: Tympanic membrane and ear canal normal.     Left Ear: Tympanic membrane and ear canal normal.     Mouth/Throat:     Mouth: Mucous membranes are moist.     Pharynx: No posterior oropharyngeal erythema.  Eyes:     Conjunctiva/sclera: Conjunctivae normal.  Neck:     Thyroid: No thyromegaly.  Cardiovascular:     Rate and Rhythm: Normal rate and regular rhythm.     Pulses: Normal pulses.     Heart sounds: S1 normal and S2 normal.  Pulmonary:     Effort: Pulmonary effort is normal.     Breath sounds: Normal breath sounds and air entry.  Abdominal:     General: Bowel  sounds are normal.     Palpations: Abdomen is soft.  Musculoskeletal:     Right lower leg: No edema.     Left lower leg: No edema.  Lymphadenopathy:     Cervical: No cervical adenopathy.  Neurological:     Mental Status: She is alert and oriented to person, place, and time. Mental status is at baseline.     Gait: Gait is intact.  Psychiatric:        Mood and Affect: Mood and affect normal.        Speech: Speech normal.        Behavior: Behavior normal.        Judgment: Judgment normal.      No results found for any visits on 04/22/23.     Assessment & Plan:    Routine Health Maintenance and Physical Exam  Immunization History  Administered Date(s) Administered   Fluad Quad(high Dose 65+) 10/16/2018, 10/20/2021   Fluad Trivalent(High Dose 65+) 10/22/2022   Influenza Split 11/15/2010, 11/08/2011   Influenza Whole 11/26/2006, 11/25/2007, 11/17/2008, 11/02/2009   Influenza, High Dose Seasonal PF 10/21/2014, 10/27/2015, 10/25/2016, 10/10/2017   Influenza-Unspecified 10/13/2013, 10/29/2019, 10/13/2020   Moderna Covid-19 Vaccine Bivalent Booster 30yrs & up 10/16/2022   PFIZER(Purple Top)SARS-COV-2 Vaccination 03/21/2019, 04/11/2019, 11/13/2019, 10/20/2020   PNEUMOCOCCAL CONJUGATE-20 04/28/2021   Pfizer Covid-19 Vaccine Bivalent Booster 23yrs & up 11/09/2021   Pneumococcal Conjugate-13 12/16/2014   Pneumococcal Polysaccharide-23 10/04/2016   Respiratory Syncytial Virus Vaccine,Recomb Aduvanted(Arexvy) 11/16/2021   Td 02/12/1998, 01/04/2009, 09/25/2019   Zoster Recombinant(Shingrix) 06/23/2019, 09/02/2019   Zoster, Live 02/19/2014    Health Maintenance  Topic Date Due   COVID-19 Vaccine (7 - 2024-25 season) 12/11/2022   Medicare Annual Wellness (AWV)  12/10/2023   MAMMOGRAM  03/06/2025   Colonoscopy  08/09/2029   DTaP/Tdap/Td (4 - Tdap) 09/24/2029   Pneumonia Vaccine 74+ Years old  Completed   INFLUENZA VACCINE  Completed   DEXA SCAN  Completed   Hepatitis C Screening   Completed   Zoster Vaccines- Shingrix  Completed   HPV VACCINES  Aged Out   Fecal DNA (Cologuard)  Discontinued   DEXA scan repeat will be coming up in October.  Discussed health benefits of physical activity, and encouraged her to engage in regular exercise appropriate for her age and condition.  Routine general medical examination at a health care facility  Normal physical exam findings today. BP is at goal. Reviewed her bloodwork from sept, all normal, do not need to repeat any today. I counseled the patient on healthy sleep habits, handouts given on healthy eating  and exercise. Health maintenance reviewed, she will be due for DEXA in October. RTC in 6 months for BP monitoring and lab work.   Return in 6 months (on 10/23/2023).     Karie Georges, MD

## 2023-04-23 DIAGNOSIS — I10 Essential (primary) hypertension: Secondary | ICD-10-CM | POA: Diagnosis not present

## 2023-04-23 DIAGNOSIS — E669 Obesity, unspecified: Secondary | ICD-10-CM | POA: Diagnosis not present

## 2023-04-23 DIAGNOSIS — Z008 Encounter for other general examination: Secondary | ICD-10-CM | POA: Diagnosis not present

## 2023-04-23 DIAGNOSIS — Z6834 Body mass index (BMI) 34.0-34.9, adult: Secondary | ICD-10-CM | POA: Diagnosis not present

## 2023-04-23 DIAGNOSIS — M1990 Unspecified osteoarthritis, unspecified site: Secondary | ICD-10-CM | POA: Diagnosis not present

## 2023-04-23 DIAGNOSIS — G4700 Insomnia, unspecified: Secondary | ICD-10-CM | POA: Diagnosis not present

## 2023-05-09 ENCOUNTER — Encounter: Payer: Self-pay | Admitting: Family Medicine

## 2023-05-14 ENCOUNTER — Encounter: Attending: Physical Medicine & Rehabilitation | Admitting: Physical Medicine & Rehabilitation

## 2023-05-14 ENCOUNTER — Encounter: Payer: Self-pay | Admitting: Physical Medicine & Rehabilitation

## 2023-05-14 VITALS — BP 144/85 | HR 101 | Ht 60.0 in | Wt 174.0 lb

## 2023-05-14 DIAGNOSIS — M533 Sacrococcygeal disorders, not elsewhere classified: Secondary | ICD-10-CM | POA: Insufficient documentation

## 2023-05-14 DIAGNOSIS — M7062 Trochanteric bursitis, left hip: Secondary | ICD-10-CM | POA: Insufficient documentation

## 2023-05-14 DIAGNOSIS — M7061 Trochanteric bursitis, right hip: Secondary | ICD-10-CM | POA: Diagnosis not present

## 2023-05-14 NOTE — Progress Notes (Signed)
 Subjective:  73 year old female with history of morbid obesity chronic hip and low back and buttock pain.  Prior history of L5-S1 fusion.  She denies any pain above the waist or above the level of the fusion. Some itching in trunk in the last month or so did not respond to cortisone cream or benadryl.  No worsening with time, following up with primary care Lateral hip is much better since the bilateral ultrasound-guided troch bursa injection performed around 6 weeks ago.  Patient ID: Colleen Coffey, female    DOB: [birth year], 73 y.o.   MRN: [id number] 05/24/22- Bilateral Trochanteric bursa injection With ultrasound guidance 15Hz  Linear transducer    HPI Patient complaining of right greater than left low back pain.  Also has some right lateral hip pain as well but this is not as bad.  She does not complain of any pain going down her legs although on her pain diagram there is some shaping of these areas as well.  She has increased pain with walking standing and bending.  No numbness tingling.  No bowel or bladder dysfunction  Pain Inventory Average Pain 8 Pain Right Now 6 My pain is intermittent, sharp, and aching  In the last 24 hours, has pain interfered with the following? General activity 5 Relation with others 0 Enjoyment of life 8 What TIME of day is your pain at its worst? morning  and evening Sleep (in general) Good  Pain is worse with: walking, bending, standing, and some activites Pain improves with: injections Relief from Meds: 2  Family History  Problem Relation Age of Onset   High blood pressure Mother    High Cholesterol Mother    Dementia Mother    Arthritis Mother    Heart disease Mother    Syncope episode Mother    High blood pressure Father    COPD Father    High blood pressure Sister    Parkinson's disease Sister    Arthritis Sister    High blood pressure Maternal Grandmother    High Cholesterol Maternal Grandmother    Dementia Maternal Grandmother     Arthritis Maternal Grandmother    COPD Brother    High blood pressure Brother    Hyperlipidemia Other    Hypertension Other    Depression Other    Esophageal cancer Neg Hx    Colon cancer Neg Hx    Pancreatic cancer Neg Hx    Stomach cancer Neg Hx    Liver disease Neg Hx    Breast cancer Neg Hx    Social History   Socioeconomic History   Marital status: Widowed    Spouse name: Not on file   Number of children: 2   Years of education: Not on file   Highest education level: 12th grade  Occupational History   Occupation: Retired  Tobacco Use   Smoking status: Never   Smokeless tobacco: Never  Vaping Use   Vaping status: Never Used  Substance and Sexual Activity   Alcohol use: No   Drug use: No   Sexual activity: Not Currently    Birth control/protection: Post-menopausal  Other Topics Concern   Not on file  Social History Narrative   Work or School: retired, used to be a Merchandiser, retail for [employer]      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; as a Software engineer; husband and mother passed in 2013      Spiritual Beliefs: Christian      Lifestyle: exercising; eating healthy  Social Drivers of Corporate investment banker Strain: Low Risk  (04/18/2023)   Overall Financial Resource Strain (CARDIA)    Difficulty of Paying Living Expenses: Not very hard  Food Insecurity: No Food Insecurity (04/18/2023)   Hunger Vital Sign    Worried About Running Out of Food in the Last Year: Never true    Ran Out of Food in the Last Year: Never true  Transportation Needs: No Transportation Needs (04/18/2023)   PRAPARE - Administrator, Civil Service (Medical): No    Lack of Transportation (Non-Medical): No  Physical Activity: Insufficiently Active (04/18/2023)   Exercise Vital Sign    Days of Exercise per Week: 2 days    Minutes of Exercise per Session: 40 min  Stress: No Stress Concern Present (04/18/2023)   [employer] of Occupational Health - Occupational Stress Questionnaire     Feeling of Stress : Only a little  Social Connections: Moderately Integrated (04/18/2023)   Social Connection and Isolation Panel [NHANES]    Frequency of Communication with Friends and Family: More than three times a week    Frequency of Social Gatherings with Friends and Family: Twice a week    Attends Religious Services: More than 4 times per year    Active Member of [employer] or Organizations: Yes    Attends Banker Meetings: More than 4 times per year    Marital Status: Widowed   Past Surgical History:  Procedure Laterality Date   APPENDECTOMY     BACK SURGERY  2014   bulging disc lumbar spine   BREAST CYST ASPIRATION Right    CESAREAN SECTION     2   CHOLECYSTECTOMY N/A 11/16/2020   Procedure: LAPAROSCOPIC CHOLECYSTECTOMY;  Surgeon: Griselda Miner, MD;  Location: [facility] OR;  Service: General;  Laterality: N/A;   Past Surgical History:  Procedure Laterality Date   APPENDECTOMY     BACK SURGERY  2014   bulging disc lumbar spine   BREAST CYST ASPIRATION Right    CESAREAN SECTION     2   CHOLECYSTECTOMY N/A 11/16/2020   Procedure: LAPAROSCOPIC CHOLECYSTECTOMY;  Surgeon: Chevis Pretty III, MD;  Location: [facility] OR;  Service: General;  Laterality: N/A;   Past Medical History:  Diagnosis Date   Constipation    chronic, improved with healthy diet   Endocervical polyp    GERD (gastroesophageal reflux disease)    Hemorrhoids    resolved   Hypertension    Osteoarthritis, knee 06/27/2015   -saw [facility]    Osteopenia 06/27/2015   -reports on medication remotely with gyn    BP (!) 144/85   Pulse (!) 101   Ht 5' (1.524 m)   Wt 174 lb (78.9 kg)   LMP 01/11/2017 (Exact Date)   SpO2 98%   BMI 33.98 kg/m   Opioid Risk Score:   Fall Risk Score:  `1  Depression screen [facility] 2/9     04/22/2023    8:43 AM 12/10/2022    4:02 PM 10/22/2022    8:24 AM 07/05/2022   10:59 AM 05/24/2022   11:29 AM 04/26/2022   11:27 AM 04/18/2022    9:16 AM  Depression screen PHQ 2/9   Decreased Interest 0 0 0 0 0 0 0  Down, Depressed, Hopeless 0 0 1 0 0 0 0  PHQ - 2 Score 0 0 1 0 0 0 0  Altered sleeping 0 0 2   2 0  Tired, decreased energy 0  0 1   2 0  Change in appetite 0 0 0   0 0  Feeling bad or failure about yourself  0 0 0   0 0  Trouble concentrating 0 0 0   0 0  Moving slowly or fidgety/restless 0 0 0   3 0  Suicidal thoughts 0 0 0   0 0  PHQ-9 Score 0 0 4   7 0  Difficult doing work/chores  Not difficult at all Not difficult at all        Review of Systems  Musculoskeletal:        Bilateral back of leg pain  All other systems reviewed and are negative.      Objective:   Physical Exam Mild pain with right lateral hip compression no pain with left lateral hip compression Mood affect appropriate General no acute distress Negative straight leg raising Lumbar range of motion is 75% forward flexion extension lateral bending and rotation mild pain with lateral bending There is tenderness over the PSIS) left side.  Sacral thrust (prone) : Positive right SI Lateral compression: Positive right hip FABER's: Positive right SI Distraction (supine): Negative Thigh thrust test: Positive right SI       Assessment & Plan:   1.  Sacroiliac dysfunction status post lumbar fusion.  We discussed treatment options including chiropractic care, physical therapy as well as sacroiliac injection.  She has had good relief with injections in the past.  Will schedule for right sacroiliac injection under fluoroscopic guidance. 2.  Trochanteric bursitis right side mild I do not think this is her primary pain issue no need for reinjection at this time

## 2023-05-14 NOTE — Patient Instructions (Signed)

## 2023-05-20 ENCOUNTER — Other Ambulatory Visit: Payer: Self-pay | Admitting: Podiatry

## 2023-06-07 ENCOUNTER — Other Ambulatory Visit: Payer: Self-pay | Admitting: Family Medicine

## 2023-06-07 DIAGNOSIS — G4709 Other insomnia: Secondary | ICD-10-CM

## 2023-06-10 NOTE — Progress Notes (Unsigned)
  PROCEDURE RECORD [facility]   Name: Colleen Coffey DOB:March 22, 1950 MRN: [id number]  Date:06/10/2023  Physician: Janeece Mechanic, MD    Nurse/CMA: Zoanne Hinders  Allergies: No Known Allergies  Consent Signed: {yes QM:[id number]}  Is patient diabetic? {yes no:314532}  CBG today? ***  Pregnant: {yes no:314532} LMP: Patient's last menstrual period was 01/11/2017 (exact date). (age 73-55)  Anticoagulants: {Yes/No:19989} Anti-inflammatory: {Yes/No:19989} Antibiotics: {Yes/No:19989}  Procedure: RIght sacroiliac Steroid Injection  Position: Prone Start Time: ***  End Time: ***  Fluoro Time: ***  RN/CMA      Time      BP      Pulse      Respirations      O2 Sat      S/S      Pain Level       D/C home with ***, patient A & O X 3, D/C instructions reviewed, and sits independently.

## 2023-06-11 ENCOUNTER — Encounter (HOSPITAL_BASED_OUTPATIENT_CLINIC_OR_DEPARTMENT_OTHER): Admitting: Physical Medicine & Rehabilitation

## 2023-06-11 ENCOUNTER — Encounter: Payer: Self-pay | Admitting: Physical Medicine & Rehabilitation

## 2023-06-11 VITALS — BP 139/78 | HR 82 | Temp 98.3°F | Ht 60.0 in | Wt 177.0 lb

## 2023-06-11 DIAGNOSIS — M533 Sacrococcygeal disorders, not elsewhere classified: Secondary | ICD-10-CM | POA: Diagnosis not present

## 2023-06-11 MED ORDER — BETAMETHASONE SOD PHOS & ACET 6 (3-3) MG/ML IJ SUSP
3.0000 mg | Freq: Once | INTRAMUSCULAR | Status: AC
  Administered 2023-06-11: 3 mg via INTRA_ARTICULAR

## 2023-06-11 MED ORDER — IOHEXOL 180 MG/ML  SOLN
3.0000 mL | Freq: Once | INTRAMUSCULAR | Status: AC
  Administered 2023-06-11: 3 mL

## 2023-06-11 MED ORDER — LIDOCAINE HCL (PF) 2 % IJ SOLN
1.0000 mL | Freq: Once | INTRAMUSCULAR | Status: AC
  Administered 2023-06-11: 1 mL

## 2023-06-11 MED ORDER — LIDOCAINE HCL 1 % IJ SOLN
5.0000 mL | Freq: Once | INTRAMUSCULAR | Status: AC
  Administered 2023-06-11: 5 mL

## 2023-06-11 NOTE — Progress Notes (Signed)
Right sacroiliac injection under fluoroscopic guidance  Indication: Right Low back and buttocks pain not relieved by medication management and other conservative care.  Informed consent was obtained after describing risks and benefits of the procedure with the patient, this includes bleeding, bruising, infection, paralysis and medication side effects. The patient wishes to proceed and has given written consent. The patient was placed in a prone position. The lumbar and sacral area was marked and prepped with Betadine. A 25-gauge 1-1/2 inch needle was inserted into the skin and subcutaneous tissue and 1 mL of 1% lidocaine was injected. Then a 25-gauge 3.5 inch spinal needle was inserted under fluoroscopic guidance into the Right sacroiliac joint. AP and lateral images were utilized. Omnipaque 180x0.5 mL under live fluoroscopy demonstrated no intravascular uptake. Then a solution containing one ML of 6 mgper ml betamethasone and 2 ML of 1% lidocaine MPF was injected x1.5 mL. Patient tolerated the procedure well. Post procedure instructions were given. Please see post procedure form. 

## 2023-06-17 ENCOUNTER — Other Ambulatory Visit: Payer: Self-pay | Admitting: Podiatry

## 2023-07-10 ENCOUNTER — Other Ambulatory Visit: Payer: Self-pay | Admitting: Family Medicine

## 2023-07-10 DIAGNOSIS — I10 Essential (primary) hypertension: Secondary | ICD-10-CM

## 2023-07-14 ENCOUNTER — Other Ambulatory Visit: Payer: Self-pay | Admitting: Podiatry

## 2023-07-23 ENCOUNTER — Other Ambulatory Visit: Payer: Self-pay | Admitting: Family Medicine

## 2023-07-23 DIAGNOSIS — I10 Essential (primary) hypertension: Secondary | ICD-10-CM

## 2023-08-09 ENCOUNTER — Encounter: Payer: Self-pay | Admitting: Physical Medicine & Rehabilitation

## 2023-08-09 ENCOUNTER — Encounter: Attending: Physical Medicine & Rehabilitation | Admitting: Physical Medicine & Rehabilitation

## 2023-08-09 VITALS — BP 137/77 | HR 85 | Ht 60.0 in | Wt 182.0 lb

## 2023-08-09 DIAGNOSIS — M533 Sacrococcygeal disorders, not elsewhere classified: Secondary | ICD-10-CM | POA: Insufficient documentation

## 2023-08-09 NOTE — Progress Notes (Signed)
 Subjective:    Patient ID: Colleen Coffey, female    DOB: 1950-10-22, 73 y.o.   MRN: [id number] 05/24/22- Bilateral Trochanteric bursa injection With ultrasound guidance 15Hz  Linear transducer   06/11/2023 Right sacroiliac injection under fluoro HPI 73 year old female with chronic hip and buttock pain.  She recently underwent image guided right sacroiliac injection which is given her at least a 50% reduction in her pain scores compared to prior visit.  She feels like it is still effective.  We discussed the 2 regions where she has had pain both the posterior hip on the right side as well as bilateral trochanteric bursa's. She has had no falls or trauma.  Functionally she is doing quite well she is independent with all self-care and mobility Doing chair  Pain Inventory Average Pain 4 Pain Right Now 3 My pain is intermittent, constant, sharp, dull, and aching  In the last 24 hours, has pain interfered with the following? General activity 0 Relation with others 0 Enjoyment of life 0 What TIME of day is your pain at its worst? morning  and evening Sleep (in general) Good  Pain is worse with: walking, bending, standing, and some activites Pain improves with: rest, therapy/exercise, pacing activities, medication, TENS, and injections Relief from Meds: 8  Family History  Problem Relation Age of Onset   High blood pressure Mother    High Cholesterol Mother    Dementia Mother    Arthritis Mother    Heart disease Mother    Syncope episode Mother    High blood pressure Father    COPD Father    High blood pressure Sister    Parkinson's disease Sister    Arthritis Sister    High blood pressure Maternal Grandmother    High Cholesterol Maternal Grandmother    Dementia Maternal Grandmother    Arthritis Maternal Grandmother    COPD Brother    High blood pressure Brother    Hyperlipidemia Other    Hypertension Other    Depression Other    Esophageal cancer Neg Hx    Colon  cancer Neg Hx    Pancreatic cancer Neg Hx    Stomach cancer Neg Hx    Liver disease Neg Hx    Breast cancer Neg Hx    Social History   Socioeconomic History   Marital status: Widowed    Spouse name: Not on file   Number of children: 2   Years of education: Not on file   Highest education level: 12th grade  Occupational History   Occupation: Retired  Tobacco Use   Smoking status: Never   Smokeless tobacco: Never  Vaping Use   Vaping status: Never Used  Substance and Sexual Activity   Alcohol use: No   Drug use: No   Sexual activity: Not Currently    Birth control/protection: Post-menopausal  Other Topics Concern   Not on file  Social History Narrative   Work or School: retired, used to be a Merchandiser, retail for [employer]      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; as a Software engineer; husband and mother passed in 2013      Spiritual Beliefs: Christian      Lifestyle: exercising; eating healthy      Social Drivers of Health   Financial Resource Strain: Low Risk  (04/18/2023)   Overall Financial Resource Strain (CARDIA)    Difficulty of Paying Living Expenses: Not very hard  Food Insecurity: No Food Insecurity (04/18/2023)   Hunger Vital Sign  Worried About Programme researcher, broadcasting/film/video in the Last Year: Never true    Ran Out of Food in the Last Year: Never true  Transportation Needs: No Transportation Needs (04/18/2023)   PRAPARE - Administrator, Civil Service (Medical): No    Lack of Transportation (Non-Medical): No  Physical Activity: Insufficiently Active (04/18/2023)   Exercise Vital Sign    Days of Exercise per Week: 2 days    Minutes of Exercise per Session: 40 min  Stress: No Stress Concern Present (04/18/2023)   [employer] of Occupational Health - Occupational Stress Questionnaire    Feeling of Stress : Only a little  Social Connections: Moderately Integrated (04/18/2023)   Social Connection and Isolation Panel    Frequency of Communication with Friends and Family: More  than three times a week    Frequency of Social Gatherings with Friends and Family: Twice a week    Attends Religious Services: More than 4 times per year    Active Member of [employer] or Organizations: Yes    Attends Banker Meetings: More than 4 times per year    Marital Status: Widowed   Past Surgical History:  Procedure Laterality Date   APPENDECTOMY     BACK SURGERY  2014   bulging disc lumbar spine   BREAST CYST ASPIRATION Right    CESAREAN SECTION     2   CHOLECYSTECTOMY N/A 11/16/2020   Procedure: LAPAROSCOPIC CHOLECYSTECTOMY;  Surgeon: Curvin Deward MOULD, MD;  Location: [facility] OR;  Service: General;  Laterality: N/A;   Past Surgical History:  Procedure Laterality Date   APPENDECTOMY     BACK SURGERY  2014   bulging disc lumbar spine   BREAST CYST ASPIRATION Right    CESAREAN SECTION     2   CHOLECYSTECTOMY N/A 11/16/2020   Procedure: LAPAROSCOPIC CHOLECYSTECTOMY;  Surgeon: Curvin Deward III, MD;  Location: [facility] OR;  Service: General;  Laterality: N/A;   Past Medical History:  Diagnosis Date   Constipation    chronic, improved with healthy diet   Endocervical polyp    GERD (gastroesophageal reflux disease)    Hemorrhoids    resolved   Hypertension    Osteoarthritis, knee 06/27/2015   -saw [facility]    Osteopenia 06/27/2015   -reports on medication remotely with gyn    BP 137/77   Pulse 85   Ht 5' (1.524 m)   Wt 182 lb (82.6 kg)   LMP 01/11/2017 (Exact Date)   SpO2 96%   BMI 35.54 kg/m   Opioid Risk Score:   Fall Risk Score:  `1  Depression screen [facility] 2/9     08/09/2023    9:53 AM 06/11/2023   10:10 AM 04/22/2023    8:43 AM 12/10/2022    4:02 PM 10/22/2022    8:24 AM 07/05/2022   10:59 AM 05/24/2022   11:29 AM  Depression screen PHQ 2/9  Decreased Interest 0 0 0 0 0 0 0  Down, Depressed, Hopeless 0 0 0 0 1 0 0  PHQ - 2 Score 0 0 0 0 1 0 0  Altered sleeping   0 0 2    Tired, decreased energy   0 0 1    Change in appetite   0 0 0    Feeling bad  or failure about yourself    0 0 0    Trouble concentrating   0 0 0    Moving slowly or fidgety/restless  0 0 0    Suicidal thoughts   0 0 0    PHQ-9 Score   0 0 4    Difficult doing work/chores    Not difficult at all Not difficult at all      Review of Systems  Musculoskeletal:  Positive for back pain.       Right leg pain  All other systems reviewed and are negative.      Objective:   Physical Exam General No acute distress Mood affect appropriate Extremities without edema She has mild tenderness palpation over the greater trochanters of the hip There is positive pain to palpation over the SI with firm pressure on the right side.  Mildly positive Faber's on the right side as well as thigh thrust. Ambulates without assist device no evidence of toe drag or knee stability Negative straight leg raising bilaterally Motor strength is normal bilateral hip flexors knee extensors ankle dorsiflexors Sensations normal       Assessment & Plan:   1.  Right sacroiliac disorder at least 50% improvement after last injection which was performed 2 months ago as discussed with patient will monitor for recrudescence.   2.  History of bilateral trochanteric bursitis at this point only some mild discomfort to palpation.  Continue with PT exercises. Patient is to call to see me again if her pain starts worsening.  Depending on which area we can schedule the appropriate injection

## 2023-08-09 NOTE — Patient Instructions (Signed)
 Please call for Ultrasound guided hip bursa injection if the side of your hip starts hurting more  Please call for sacroiliac injection right side if low back /buttocks pain becomes more severe

## 2023-08-12 ENCOUNTER — Other Ambulatory Visit: Payer: Self-pay | Admitting: Podiatry

## 2023-08-31 ENCOUNTER — Other Ambulatory Visit: Payer: Self-pay | Admitting: Family Medicine

## 2023-08-31 DIAGNOSIS — G4709 Other insomnia: Secondary | ICD-10-CM

## 2023-10-01 DIAGNOSIS — H40023 Open angle with borderline findings, high risk, bilateral: Secondary | ICD-10-CM | POA: Diagnosis not present

## 2023-10-17 ENCOUNTER — Other Ambulatory Visit: Payer: Self-pay | Admitting: Family Medicine

## 2023-10-17 DIAGNOSIS — I10 Essential (primary) hypertension: Secondary | ICD-10-CM

## 2023-10-23 ENCOUNTER — Ambulatory Visit (INDEPENDENT_AMBULATORY_CARE_PROVIDER_SITE_OTHER): Admitting: Family Medicine

## 2023-10-23 ENCOUNTER — Encounter: Payer: Self-pay | Admitting: Family Medicine

## 2023-10-23 VITALS — BP 140/68 | HR 83 | Temp 98.3°F | Ht 60.0 in | Wt 183.0 lb

## 2023-10-23 DIAGNOSIS — Z13220 Encounter for screening for lipoid disorders: Secondary | ICD-10-CM

## 2023-10-23 DIAGNOSIS — R7303 Prediabetes: Secondary | ICD-10-CM | POA: Diagnosis not present

## 2023-10-23 DIAGNOSIS — Z23 Encounter for immunization: Secondary | ICD-10-CM | POA: Diagnosis not present

## 2023-10-23 DIAGNOSIS — Z780 Asymptomatic menopausal state: Secondary | ICD-10-CM

## 2023-10-23 DIAGNOSIS — I10 Essential (primary) hypertension: Secondary | ICD-10-CM

## 2023-10-23 LAB — COMPREHENSIVE METABOLIC PANEL WITH GFR
ALT: 22 U/L (ref 0–35)
AST: 22 U/L (ref 0–37)
Albumin: 4.4 g/dL (ref 3.5–5.2)
Alkaline Phosphatase: 61 U/L (ref 39–117)
BUN: 12 mg/dL (ref 6–23)
CO2: 31 meq/L (ref 19–32)
Calcium: 10 mg/dL (ref 8.4–10.5)
Chloride: 101 meq/L (ref 96–112)
Creatinine, Ser: 0.73 mg/dL (ref 0.40–1.20)
GFR: 81.1 mL/min (ref >60.00)
Glucose, Bld: 99 mg/dL (ref 70–99)
Potassium: 3.9 meq/L (ref 3.5–5.1)
Sodium: 138 meq/L (ref 135–145)
Total Bilirubin: 0.5 mg/dL (ref 0.2–1.2)
Total Protein: 7.9 g/dL (ref 6.0–8.3)

## 2023-10-23 LAB — LIPID PANEL
Cholesterol: 201 mg/dL — ABNORMAL HIGH (ref 0–200)
HDL: 71.5 mg/dL (ref >39.00)
LDL Cholesterol: 106 mg/dL — ABNORMAL HIGH (ref 0–99)
NonHDL: 129.34
Total CHOL/HDL Ratio: 3
Triglycerides: 117 mg/dL (ref 0.0–149.0)
VLDL: 23.4 mg/dL (ref 0.0–40.0)

## 2023-10-23 LAB — HEMOGLOBIN A1C: Hgb A1c MFr Bld: 6 % (ref 4.6–6.5)

## 2023-10-23 NOTE — Progress Notes (Signed)
 Established Patient Office Visit  Subjective   Patient ID: Colleen Coffey, female    DOB: 21-Aug-1950  Age: 73 y.o. MRN: [id number]  Chief Complaint  Patient presents with   Medical Management of Chronic Issues    HPI Discussed the use of AI scribe software for clinical note transcription with the patient, who gave verbal consent to proceed.  History of Present Illness   Colleen Coffey is a 73 year old female who presents for a six-month follow-up visit to check her blood pressure and labs.  She has not been regularly monitoring her blood pressure at home. Her previous blood pressure readings at other appointments were 132/72 and 137/70. She adheres to her medication regimen, taking her medications at night except for calcium , which she takes in the morning.  She reports a decrease in physical activity due to leg issues but maintains some activity by doing chair exercises four times a week. She has not made any dietary changes, particularly regarding sugar intake.  Her past medical history includes mild bone loss noted in a previous bone density test and arthritis, which she describes as affecting her 'everywhere'.  She notes a little swelling on the right side of her foot.       Current Outpatient Medications  Medication Instructions   acetaminophen  (TYLENOL ) 650 mg, Oral, Every 6 hours PRN   amLODipine  (NORVASC ) 10 mg, Oral, Daily   Cholecalciferol (VITAMIN D3 PO) 1,000 Units, Daily   hydrochlorothiazide  (HYDRODIURIL ) 12.5 mg, Oral, Daily   Multiple Vitamins-Minerals (ZINC PO) 50 mg, Daily   polyethylene glycol (MIRALAX ) 17 g, Daily   traZODone  (DESYREL ) 50 MG tablet TAKE 1/2 TO 1 TABLET AT BEDTIME AS NEEDED SLEEP    Patient Active Problem List   Diagnosis Date Noted   Trochanteric bursitis of both hips 07/05/2022   Postlaminectomy syndrome, lumbar region 07/05/2022   Sacroiliac dysfunction 07/05/2022   Muscle pain 10/19/2021   Prediabetes 10/19/2021    Hypertension 04/27/2020   Osteoarthritis, knee 06/27/2015   BMI 28.0-28.9,adult 06/27/2015   Constipation 06/27/2015   Osteopenia 06/27/2015     Review of Systems  All other systems reviewed and are negative.     Objective:     BP (!) 140/68   Pulse 83   Temp 98.3 F (36.8 C) (Oral)   Ht 5' (1.524 m)   Wt 183 lb (83 kg)   LMP 01/11/2017 (Exact Date)   SpO2 96%   BMI 35.74 kg/m    Physical Exam Vitals reviewed.  Constitutional:      Appearance: Normal appearance. She is well-groomed. She is obese.  Cardiovascular:     Rate and Rhythm: Normal rate and regular rhythm.     Heart sounds: S1 normal and S2 normal.  Pulmonary:     Effort: Pulmonary effort is normal.     Breath sounds: Normal breath sounds and air entry.  Musculoskeletal:     Right lower leg: No edema.     Left lower leg: No edema.  Neurological:     Mental Status: She is alert and oriented to person, place, and time. Mental status is at baseline.     Gait: Gait is intact.  Psychiatric:        Mood and Affect: Mood and affect normal.        Speech: Speech normal.        Behavior: Behavior normal.        Judgment: Judgment normal.      No results found  for any visits on 10/23/23.    The 10-year ASCVD risk score (Arnett DK, et al., 2019) is: 17.3%    Assessment & Plan:  Primary hypertension -     Comprehensive metabolic panel with GFR; Future  Prediabetes -     Hemoglobin A1c; Future  Lipid screening -     Lipid panel; Future  Postmenopausal state -     DG Bone Density; Future  Immunization due -     Flu vaccine HIGH DOSE PF(Fluzone Trivalent)   Assessment and Plan    Essential hypertension Blood pressure slightly elevated at 140/68 mmHg, however pt states she usually runs in the 130's, reviewed previous blood pressures from other visits which confirm this finding. Will not make any changes at this time.  - Continue current antihypertensive medications.  Prediabetes Previous A1c  was 5.6%, within normal range. - Order A1c test to assess current blood sugar control.  Osteopenia Previous bone density test in 2023 showed mild bone loss. - Order DEXA scan at [facility] office. - Provide address and phone number for scheduling DEXA scan.  Osteoarthritis Reports arthritis in multiple locations.  General Health Maintenance Routine health maintenance up to date. Discussed flu and COVID vaccinations. - Administer flu vaccine today. - Monitor availability of COVID vaccine at [facility] and [facility]. - Send COVID vaccine prescription to [facility] upon request.        Return in about 6 months (around 04/21/2024) for annual physical exam.    Heron CHRISTELLA Sharper, MD

## 2023-10-28 ENCOUNTER — Ambulatory Visit: Payer: Self-pay | Admitting: Family Medicine

## 2023-10-28 ENCOUNTER — Ambulatory Visit
Admission: RE | Admit: 2023-10-28 | Discharge: 2023-10-28 | Disposition: A | Discharge status: Home / Self Care | Source: Ambulatory Visit | Attending: Family Medicine | Admitting: Family Medicine

## 2023-10-28 DIAGNOSIS — E782 Mixed hyperlipidemia: Secondary | ICD-10-CM

## 2023-10-28 DIAGNOSIS — Z780 Asymptomatic menopausal state: Secondary | ICD-10-CM

## 2023-10-29 MED ORDER — ATORVASTATIN CALCIUM 20 MG PO TABS: 20.0000 mg | ORAL_TABLET | Freq: Every day | ORAL | 3 refills | Status: DC

## 2023-10-30 DIAGNOSIS — E785 Hyperlipidemia, unspecified: Secondary | ICD-10-CM | POA: Diagnosis not present

## 2023-10-30 DIAGNOSIS — Z008 Encounter for other general examination: Secondary | ICD-10-CM | POA: Diagnosis not present

## 2023-11-02 NOTE — Progress Notes (Signed)
 [facility] Return Visit   Referring Provider Ozell Heron HERO, MD [address] [place],  [place] [address]  Primary Care Provider Ozell Heron HERO, MD  Patient Profile: Colleen Coffey is a 73 y.o. female who returns to the [facility] for follow-up of the problem(s) noted below.  Problem List: Chronic idiopathic constipation Internal hemorrhoids with intermittent bleeding Intestinal gas   History of Present Illness   Ms. Colleen Coffey was last seen in the GI office 10/20/2021 by Dr. Aneita   Current GI Meds    Interval History  Ms. Colleen Coffey is a 73 year old female with a past medical history noteworthy for HTN, prediabetes, osteopenia, osteoarthritis who returns to the gastroenterology office   Discussed the use of AI scribe software for clinical note transcription with the patient, who gave verbal consent to proceed.  History of Present Illness       GI Review of Symptoms Significant for {[id number]}. Otherwise negative.  General Review of Systems  Review of systems is significant for the pertinent positives and negatives as listed per the HPI.  Full ROS is otherwise negative.  Past Medical History   Past Medical History:  Diagnosis Date   Constipation    chronic, improved with healthy diet   Endocervical polyp    GERD (gastroesophageal reflux disease)    Hemorrhoids    resolved   Hypertension    Osteoarthritis, knee 06/27/2015   -saw [facility]    Osteopenia 06/27/2015   -reports on medication remotely with gyn      Past Surgical History   Past Surgical History:  Procedure Laterality Date   APPENDECTOMY     BACK SURGERY  2014   bulging disc lumbar spine   BREAST CYST ASPIRATION Right    CESAREAN SECTION     2   CHOLECYSTECTOMY N/A 11/16/2020   Procedure: LAPAROSCOPIC CHOLECYSTECTOMY;  Surgeon: Curvin Deward MOULD, MD;  Location: [facility] OR;  Service: General;  Laterality: N/A;     Allergies and  Medications   No Known Allergies @MEDSTODAY @  Family His   Family History  Problem Relation Age of Onset   High blood pressure Mother    High Cholesterol Mother    Dementia Mother    Arthritis Mother    Heart disease Mother    Syncope episode Mother    High blood pressure Father    COPD Father    High blood pressure Sister    Parkinson's disease Sister    Arthritis Sister    High blood pressure Maternal Grandmother    High Cholesterol Maternal Grandmother    Dementia Maternal Grandmother    Arthritis Maternal Grandmother    COPD Brother    High blood pressure Brother    Hyperlipidemia Other    Hypertension Other    Depression Other    Esophageal cancer Neg Hx    Colon cancer Neg Hx    Pancreatic cancer Neg Hx    Stomach cancer Neg Hx    Liver disease Neg Hx    Breast cancer Neg Hx    GI Specific Family History: {gifamhx:50061}   Social History   Social History   Tobacco Use   Smoking status: Never   Smokeless tobacco: Never  Vaping Use   Vaping status: Never Used  Substance Use Topics   Alcohol use: No   Drug use: No   Samah reports that she has never smoked. She has never used smokeless tobacco. She reports that she does not drink  alcohol and does not use drugs.  Vital Signs and Physical Examination   There were no vitals filed for this visit. There is no height or weight on file to calculate BMI.    General: Well developed, well nourished, no acute distress Head: Normocephalic and atraumatic Eyes: Sclerae anicteric, EOMI Ears: Normal auditory acuity Mouth: No deformities or lesions noted Lungs: Clear throughout to auscultation Heart: Regular rate and rhythm; No murmurs, rubs or bruits Abdomen: Soft, non tender and non distended. No masses, hepatosplenomegaly or hernias noted. Normal Bowel sounds Rectal: Musculoskeletal: Symmetrical with no gross deformities  Pulses:  Normal pulses noted Extremities: No edema or deformities noted Neurological:  Alert oriented x 4, grossly nonfocal Psychological:  Alert and cooperative. Normal mood and affect   Review of Data   The following data was reviewed at the time of this encounter:   Laboratory Studies      Latest Ref Rng & Units 04/28/2021   10:52 AM 11/16/2020    1:15 AM 11/14/2020    9:25 PM  CBC  WBC 4.0 - 10.5 K/uL 6.5  12.5  9.7   Hemoglobin 12.0 - 15.0 g/dL 86.3  87.1  87.2   Hematocrit 36.0 - 46.0 % 40.7  37.9  38.2   Platelets 150.0 - 400.0 K/uL 366.0  394  403     Lab Results  Component Value Date   LIPASE 25 11/14/2020      Latest Ref Rng & Units 10/23/2023   10:26 AM 10/22/2022    9:03 AM 10/18/2021   10:09 AM  CMP  Glucose 70 - 99 mg/dL 99  97  91   BUN 6 - 23 mg/dL 12  15  12    Creatinine 0.40 - 1.20 mg/dL 9.26  9.18  9.26   Sodium 135 - 145 mEq/L 138  139  138   Potassium 3.5 - 5.1 mEq/L 3.9  3.9  4.7   Chloride 96 - 112 mEq/L 101  102  102   CO2 19 - 32 mEq/L 31  29  30    Calcium  8.4 - 10.5 mg/dL 89.9  9.6  89.9   Total Protein 6.0 - 8.3 g/dL 7.9  7.7  8.0   Total Bilirubin 0.2 - 1.2 mg/dL 0.5  0.5  0.7   Alkaline Phos 39 - 117 U/L 61  64  73   AST 0 - 37 U/L 22  19  26    ALT 0 - 35 U/L 22  20  26       Imaging Studies  RUQ ultrasound 11/14/2020 Sludge and stones in the gallbladder with slight increased wall thickness. Unable to assess sonographic Murphy. Findings are indeterminate for cholecystitis; further evaluation with nuclear medicine hepatobiliary imaging could be obtained if clinical symptoms are suggestive of acute gallbladder disease.   CTAP 11/14/2020 1. Suspicion of gallstones. Possible mild wall thickening at gallbladder fundus. Recommend correlation with right upper quadrant ultrasound. 2. Small fat containing ventral and umbilical hernias  GI Procedures and Studies  Colonoscopy June 2021 - One 4 mm polyp in the ascending colon, removed with a cold biopsy forceps. Resected and retrieved. Path: lymphoid tissue - Internal  hemorrhoids. - The examination was otherwise normal on direct and retroflexion views.  Colonoscopy October 2008 - Non-bleeding internal hemorrhoids   Clinical Impression  It is my clinical impression that Ms. Colleen Coffey is a 73 y.o. female with;  ***  Plan  *** *** *** *** ***   Planned Follow Up No follow-ups on file.  The patient or caregiver verbalized understanding of the material covered, with no barriers to understanding. All questions were answered. Patient or caregiver is agreeable with the plan outlined above.    It was a pleasure to see Colleen Coffey.  If you have any questions or concerns regarding this evaluation, do not hesitate to contact me.  Inocente Hausen, MD [facility]

## 2023-11-04 ENCOUNTER — Ambulatory Visit (INDEPENDENT_AMBULATORY_CARE_PROVIDER_SITE_OTHER): Admitting: Pediatrics

## 2023-11-04 ENCOUNTER — Encounter: Payer: Self-pay | Admitting: Pediatrics

## 2023-11-04 VITALS — BP 120/70 | HR 79 | Ht 60.0 in | Wt 184.0 lb

## 2023-11-04 DIAGNOSIS — K625 Hemorrhage of anus and rectum: Secondary | ICD-10-CM

## 2023-11-04 DIAGNOSIS — K5900 Constipation, unspecified: Secondary | ICD-10-CM

## 2023-11-04 DIAGNOSIS — K5909 Other constipation: Secondary | ICD-10-CM

## 2023-11-04 DIAGNOSIS — K644 Residual hemorrhoidal skin tags: Secondary | ICD-10-CM | POA: Diagnosis not present

## 2023-11-04 DIAGNOSIS — K649 Unspecified hemorrhoids: Secondary | ICD-10-CM

## 2023-11-04 MED ORDER — HYDROCORTISONE ACETATE 25 MG RE SUPP: 25.0000 mg | Freq: Two times a day (BID) | RECTAL | 1 refills | Status: DC

## 2023-11-04 NOTE — Patient Instructions (Signed)
 We have sent the following medications to your pharmacy for you to pick up at your convenience: Hydrocortisone  suppository 25 mg, insert 1 suppository twice a day.  Follow up in 2-3 months or sooner if needed.  Thank you for entrusting me with your care and for choosing [facility], Dr. Inocente Hausen  _______________________________________________________  If your blood pressure at your visit was 140/90 or greater, please contact your primary care physician to follow up on this.  _______________________________________________________  If you are age 10 or older, your body mass index should be between 23-30. Your Body mass index is 35.94 kg/m. If this is out of the aforementioned range listed, please consider follow up with your Primary Care Provider.  If you are age 60 or younger, your body mass index should be between 19-25. Your Body mass index is 35.94 kg/m. If this is out of the aformentioned range listed, please consider follow up with your Primary Care Provider.   ________________________________________________________  The [facility] providers would like to encourage you to use MYCHART to communicate with providers for non-urgent requests or questions.  Due to long hold times on the telephone, sending your provider a message by [facility] may be a faster and more efficient way to get a response.  Please allow 48 business hours for a response.  Please remember that this is for non-urgent requests.  _______________________________________________________  [facility] is using a team-based approach to care.  Your team is made up of your doctor and two to three APPS. Our APPS (Nurse Practitioners and Physician Assistants) work with your physician to ensure care continuity for you. They are fully qualified to address your health concerns and develop a treatment plan. They communicate directly with your gastroenterologist to care for you. Seeing the Advanced Practice  Practitioners on your physician's team can help you by facilitating care more promptly, often allowing for earlier appointments, access to diagnostic testing, procedures, and other specialty referrals.

## 2023-11-19 ENCOUNTER — Ambulatory Visit
Admission: RE | Admit: 2023-11-19 | Discharge: 2023-11-19 | Disposition: A | Source: Ambulatory Visit | Attending: Physical Medicine & Rehabilitation | Admitting: Physical Medicine & Rehabilitation

## 2023-11-19 ENCOUNTER — Encounter: Attending: Physical Medicine & Rehabilitation | Admitting: Physical Medicine & Rehabilitation

## 2023-11-19 ENCOUNTER — Encounter: Payer: Self-pay | Admitting: Physical Medicine & Rehabilitation

## 2023-11-19 VITALS — BP 154/75 | HR 83 | Ht 60.0 in | Wt 184.0 lb

## 2023-11-19 DIAGNOSIS — M7061 Trochanteric bursitis, right hip: Secondary | ICD-10-CM

## 2023-11-19 DIAGNOSIS — M1712 Unilateral primary osteoarthritis, left knee: Secondary | ICD-10-CM | POA: Diagnosis not present

## 2023-11-19 DIAGNOSIS — M7062 Trochanteric bursitis, left hip: Secondary | ICD-10-CM | POA: Insufficient documentation

## 2023-11-19 DIAGNOSIS — M1711 Unilateral primary osteoarthritis, right knee: Secondary | ICD-10-CM | POA: Diagnosis not present

## 2023-11-19 MED ORDER — BETAMETHASONE SOD PHOS & ACET 6 (3-3) MG/ML IJ SUSP
12.0000 mg | Freq: Once | INTRAMUSCULAR | Status: AC
  Administered 2023-11-19: 12 mg

## 2023-11-19 MED ORDER — LIDOCAINE HCL 1 % IJ SOLN
10.0000 mL | Freq: Once | INTRAMUSCULAR | Status: AC
  Administered 2023-11-19: 10 mL

## 2023-11-19 NOTE — Progress Notes (Signed)
 Bilateral Trochanteric bursa injection With ultrasound guidance 4Hz  Curvilinear transducer   Indication Trochanteric bursitis. Exam has tenderness over the greater trochanter of the hip. Pain has not responded to conservative care such as exercise therapy and oral medications. Pain interferes with sleep or with mobility Informed consent was obtained after describing risks and benefits of the procedure with the patient these include bleeding bruising and infection. Patient has signed written consent form.  The patient placed in a lateral decubitus position with the affected hip superior. Point of maximal pain was palpated marked and prepped with Betadine..  The area was scanned with 4 Hz curvilinear transducer, needle was inserted along the long access to contact the greater trochanter, needle slightly withdrawn then 6mg  of betamethasone  with 3 cc 1% lidocaine  were injected. Patient tolerated procedure well.  This procedure was first done on the left side patient was turned reprepped redraped and the identical procedure was performed on the right side.  Post procedure instructions given.

## 2023-11-25 ENCOUNTER — Ambulatory Visit
Admission: RE | Admit: 2023-11-25 | Discharge: 2023-11-25 | Disposition: A | Source: Ambulatory Visit | Attending: Family Medicine

## 2023-11-25 DIAGNOSIS — Z780 Asymptomatic menopausal state: Secondary | ICD-10-CM | POA: Diagnosis not present

## 2023-11-30 ENCOUNTER — Other Ambulatory Visit: Payer: Self-pay | Admitting: Family Medicine

## 2023-11-30 DIAGNOSIS — G4709 Other insomnia: Secondary | ICD-10-CM

## 2023-12-02 ENCOUNTER — Encounter: Payer: Self-pay | Admitting: Pediatrics

## 2023-12-02 DIAGNOSIS — K649 Unspecified hemorrhoids: Secondary | ICD-10-CM

## 2023-12-02 DIAGNOSIS — K625 Hemorrhage of anus and rectum: Secondary | ICD-10-CM

## 2023-12-03 NOTE — Telephone Encounter (Signed)
 Urgent ambulatory referral to GI in epic

## 2023-12-05 ENCOUNTER — Ambulatory Visit (INDEPENDENT_AMBULATORY_CARE_PROVIDER_SITE_OTHER): Admitting: Gastroenterology

## 2023-12-05 ENCOUNTER — Encounter: Payer: Self-pay | Admitting: Gastroenterology

## 2023-12-05 VITALS — BP 132/82 | HR 88 | Ht 60.0 in | Wt 187.5 lb

## 2023-12-05 DIAGNOSIS — K641 Second degree hemorrhoids: Secondary | ICD-10-CM | POA: Diagnosis not present

## 2023-12-05 DIAGNOSIS — K625 Hemorrhage of anus and rectum: Secondary | ICD-10-CM

## 2023-12-05 NOTE — Patient Instructions (Signed)

## 2023-12-05 NOTE — Progress Notes (Signed)
    PROCEDURE NOTE: The patient presents with symptomatic grade II  hemorrhoids, requesting rubber band ligation of his/her hemorrhoidal disease.  All risks, benefits and alternative forms of therapy were described and informed consent was obtained.  In the Left Lateral Decubitus position anoscopic examination revealed grade II hemorrhoids in the right posterior, left lateral and right anterior position(s).  The anorectum was pre-medicated with  RectiCare The decision was made to band the right posterior internal hemorrhoid, and the [facility] O'Regan System was used to perform band ligation without complication.  Digital anorectal examination was then performed to assure proper positioning of the band, and to adjust the banded tissue as required.  The patient was discharged home without pain or other issues.  Dietary and behavioral recommendations were given and along with follow-up instructions.     The following adjunctive treatments were recommended:  Benefiber 1 tablespoon daily 3 times daily with meals  The patient will return in 2 to 4 weeks for  follow-up and possible additional banding as required. No complications were encountered and the patient tolerated the procedure well.   LOIS Wilkie Mcgee , MD [phone]

## 2023-12-16 ENCOUNTER — Ambulatory Visit: Payer: No Typology Code available for payment source

## 2023-12-16 ENCOUNTER — Encounter: Payer: Self-pay | Admitting: Gastroenterology

## 2023-12-16 ENCOUNTER — Ambulatory Visit (INDEPENDENT_AMBULATORY_CARE_PROVIDER_SITE_OTHER): Admitting: Gastroenterology

## 2023-12-16 VITALS — BP 122/68 | HR 98 | Ht 60.0 in | Wt 188.1 lb

## 2023-12-16 VITALS — Ht 60.0 in | Wt 188.0 lb

## 2023-12-16 DIAGNOSIS — Z0000 Encounter for general adult medical examination without abnormal findings: Secondary | ICD-10-CM

## 2023-12-16 DIAGNOSIS — K641 Second degree hemorrhoids: Secondary | ICD-10-CM

## 2023-12-16 NOTE — Patient Instructions (Signed)
 Ms. Neth,  Thank you for taking the time for your Medicare Wellness Visit. I appreciate your continued commitment to your health goals. Please review the care plan we discussed, and feel free to reach out if I can assist you further.  Please note that Annual Wellness Visits do not include a physical exam. Some assessments may be limited, especially if the visit was conducted virtually. If needed, we may recommend an in-person follow-up with your provider.  Ongoing Care Seeing your primary care provider every 3 to 6 months helps us  monitor your health and provide consistent, personalized care.   Referrals If a referral was made during today's visit and you haven't received any updates within two weeks, please contact the referred provider directly to check on the status.  Recommended Screenings:  Health Maintenance  Topic Date Due   COVID-19 Vaccine (9 - Pfizer risk 2025-26 season) 04/26/2024   Medicare Annual Wellness Visit  12/15/2024   Breast Cancer Screening  03/06/2025   Colon Cancer Screening  08/09/2029   DTaP/Tdap/Td vaccine (4 - Tdap) 09/24/2029   Pneumococcal Vaccine for age over 32  Completed   Flu Shot  Completed   DEXA scan (bone density measurement)  Completed   Hepatitis C Screening  Completed   Zoster (Shingles) Vaccine  Completed   Meningitis B Vaccine  Aged Out   Cologuard (Stool DNA test)  Discontinued       12/15/2023    5:26 PM  Advanced Directives  Does Patient Have a Medical Advance Directive? Yes  Type of Advance Directive Living will;Healthcare Power of Attorney  Copy of Healthcare Power of Attorney in Chart? No - copy requested    Vision: Annual vision screenings are recommended for early detection of glaucoma, cataracts, and diabetic retinopathy. These exams can also reveal signs of chronic conditions such as diabetes and high blood pressure.  Dental: Annual dental screenings help detect early signs of oral cancer, gum disease, and other conditions  linked to overall health, including heart disease and diabetes.  Please see the attached documents for additional preventive care recommendations.

## 2023-12-16 NOTE — Progress Notes (Signed)
 PROCEDURE NOTE: The patient presents with symptomatic grade II  hemorrhoids, requesting rubber band ligation of his/her hemorrhoidal disease.  All risks, benefits and alternative forms of therapy were described and informed consent was obtained.   The anorectum was pre-medicated with RectiCare The decision was made to band the left lateral internal hemorrhoid, and the [facility] O'Regan System was used to perform band ligation without complication.  Digital anorectal examination was then performed to assure proper positioning of the band, and to adjust the banded tissue as required.  The patient was discharged home without pain or other issues.  Dietary and behavioral recommendations were given and along with follow-up instructions.     The following adjunctive treatments were recommended:  Benefiber 1 tablespoon twice daily with meals  The patient will return for possible additional banding as needed, follow-up with Dr. Suzann for constipation. No complications were encountered and the patient tolerated the procedure well.   LOIS Wilkie Mcgee , MD [phone]

## 2023-12-16 NOTE — Progress Notes (Cosign Needed Addendum)
 Subjective:   Colleen Coffey is a 73 y.o. female who presents for a Medicare Annual Wellness Visit.  Allergies (verified) Patient has no known allergies.   History: Past Medical History:  Diagnosis Date   Constipation    chronic, improved with healthy diet   Endocervical polyp    GERD (gastroesophageal reflux disease)    Hemorrhoids    resolved   Hyperlipidemia    Hypertension    Osteoarthritis, knee 06/27/2015   -saw [facility]    Osteopenia 06/27/2015   -reports on medication remotely with gyn    Past Surgical History:  Procedure Laterality Date   APPENDECTOMY     BACK SURGERY  2014   bulging disc lumbar spine   BREAST CYST ASPIRATION Right    CESAREAN SECTION     2   CHOLECYSTECTOMY N/A 11/16/2020   Procedure: LAPAROSCOPIC CHOLECYSTECTOMY;  Surgeon: Curvin Deward MOULD, MD;  Location: [facility] OR;  Service: General;  Laterality: N/A;   Family History  Problem Relation Age of Onset   High blood pressure Mother    High Cholesterol Mother    Dementia Mother    Arthritis Mother    Heart disease Mother    Syncope episode Mother    High blood pressure Father    COPD Father    High blood pressure Sister    Parkinson's disease Sister    Arthritis Sister    High blood pressure Maternal Grandmother    High Cholesterol Maternal Grandmother    Dementia Maternal Grandmother    Arthritis Maternal Grandmother    COPD Brother    High blood pressure Brother    Hyperlipidemia Other    Hypertension Other    Depression Other    Esophageal cancer Neg Hx    Colon cancer Neg Hx    Pancreatic cancer Neg Hx    Stomach cancer Neg Hx    Liver disease Neg Hx    Breast cancer Neg Hx    Social History   Occupational History   Occupation: Retired  Tobacco Use   Smoking status: Never   Smokeless tobacco: Never  Vaping Use   Vaping status: Never Used  Substance and Sexual Activity   Alcohol use: No   Drug use: No   Sexual activity: Not Currently    Birth  control/protection: Post-menopausal   Tobacco Counseling Counseling given: Not Answered  SDOH Screenings   Food Insecurity: No Food Insecurity (12/16/2023)  Housing: Low Risk  (12/16/2023)  Transportation Needs: No Transportation Needs (12/16/2023)  Utilities: Not At Risk (12/16/2023)  Alcohol Screen: Low Risk  (04/22/2023)  Depression (PHQ2-9): Low Risk  (12/16/2023)  Financial Resource Strain: Low Risk  (12/15/2023)  Physical Activity: Insufficiently Active (12/16/2023)  Social Connections: Moderately Integrated (12/16/2023)  Stress: No Stress Concern Present (12/16/2023)  Tobacco Use: Low Risk  (12/16/2023)  Health Literacy: Adequate Health Literacy (12/16/2023)   Depression Screen    12/16/2023   11:57 AM 08/09/2023    9:53 AM 06/11/2023   10:10 AM 04/22/2023    8:43 AM 12/10/2022    4:02 PM 10/22/2022    8:24 AM 07/05/2022   10:59 AM  PHQ 2/9 Scores  PHQ - 2 Score 0 0 0 0 0 1 0  PHQ- 9 Score 0   0 0 4      Goals Addressed             This Visit's Progress    12/16/2023: To stay healthy and control the pain in  my legs.         Visit info / Clinical Intake: Medicare Wellness Visit Type:: Subsequent Annual Wellness Visit Medicare Wellness Visit Mode:: Video Interpreter Needed?: No Pre-visit prep was completed: yes AWV questionnaire completed by patient prior to visit?: yes Date:: 12/15/23 Living arrangements:: with family/others Patient's Overall Health Status Rating: very good Typical amount of pain: none Does pain affect daily life?: no Are you currently prescribed opioids?: no  Dietary Habits and Nutritional Risks How many meals a day?: 2 (SNACKS, H2O) Eats fruit and vegetables daily?: yes Most meals are obtained by: preparing own meals In the last 2 weeks, have you had any of the following?: unintentional weight gain Diabetic:: no  Functional Status Activities of Daily Living (to include ambulation/medication): (Patient-Rptd) Independent Ambulation: Independent  with device- listed below Home Assistive Devices/Equipment: Eyeglasses Medication Administration: Independent Home Management: (Patient-Rptd) Independent Manage your own finances?: yes Primary transportation is: driving Concerns about vision?: no *vision screening is required for WTM* Concerns about hearing?: no  Fall Screening Falls in the past year?: (Patient-Rptd) 0 Number of falls in past year: (Patient-Rptd) 0 Was there an injury with Fall?: (Patient-Rptd) 0 Fall Risk Category Calculator: (Patient-Rptd) 0 Patient Fall Risk Level: (Patient-Rptd) Low Fall Risk  Fall Risk Patient at Risk for Falls Due to: No Fall Risks Fall risk Follow up: Falls evaluation completed  Home and Transportation Safety: All rugs have non-skid backing?: yes All stairs or steps have railings?: yes Grab bars in the bathtub or shower?: (!) no Have non-skid surface in bathtub or shower?: (!) no Good home lighting?: yes Regular seat belt use?: yes Hospital stays in the last year:: no  Cognitive Assessment Difficulty concentrating, remembering, or making decisions? : no Will 6CIT or Mini Cog be Completed: no 6CIT or Mini Cog Declined: patient alert, oriented, able to answer questions appropriately and recall recent events  Advance Directives (For Healthcare) Does Patient Have a Medical Advance Directive?: Yes Type of Advance Directive: Living will; Healthcare Power of Attorney Copy of Healthcare Power of Attorney in Chart?: No - copy requested Copy of Living Will in Chart?: No - copy requested        Objective:    Today's Vitals   12/16/23 1155  Weight: 188 lb (85.3 kg)  Height: 5' (1.524 m)  PainSc: 0-No pain   Body mass index is 36.72 kg/m.  Current Medications (verified) Outpatient Encounter Medications as of 12/16/2023  Medication Sig   acetaminophen  (TYLENOL ) 325 MG tablet Take 2 tablets (650 mg total) by mouth every 6 (six) hours as needed for mild pain (or temp > 100).    amLODipine  (NORVASC ) 10 MG tablet TAKE 1 TABLET BY MOUTH EVERY DAY   atorvastatin  (LIPITOR) 20 MG tablet Take 1 tablet (20 mg total) by mouth daily.   Cholecalciferol (VITAMIN D3 PO) Take 1,000 Units by mouth daily.   hydrochlorothiazide  (HYDRODIURIL ) 12.5 MG tablet TAKE 1 TABLET BY MOUTH EVERY DAY   Multiple Vitamins-Minerals (ZINC PO) Take 50 mg by mouth daily.   polyethylene glycol (MIRALAX ) 17 g packet Take 17 g by mouth daily.   traZODone  (DESYREL ) 50 MG tablet TAKE 1/2 TO 1 TABLET AT BEDTIME AS NEEDED SLEEP   No facility-administered encounter medications on file as of 12/16/2023.   Hearing/Vision screen Hearing Screening - Comments:: Denies hearing difficulties.  Vision Screening - Comments:: Wears rx glasses - up to date with routine eye exams with Chad Frazier, OD.  Immunizations and Health Maintenance Health Maintenance  Topic Date Due  COVID-19 Vaccine (9 - Pfizer risk 2025-26 season) 04/26/2024   Medicare Annual Wellness (AWV)  12/15/2024   Mammogram  03/06/2025   Colonoscopy  08/09/2029   DTaP/Tdap/Td (4 - Tdap) 09/24/2029   Pneumococcal Vaccine: 50+ Years  Completed   Influenza Vaccine  Completed   DEXA SCAN  Completed   Hepatitis C Screening  Completed   Zoster Vaccines- Shingrix   Completed   Meningococcal B Vaccine  Aged Out   Fecal DNA (Cologuard)  Discontinued        Assessment/Plan:  This is a routine wellness examination for Colleen Coffey.  Patient Care Team: Ozell Heron HERO, MD as PCP - General (Family Medicine) Frazier, Chad, OD as Consulting Physician (Optometry)  I have personally reviewed and noted the following in the patient's chart:   Medical and social history Use of alcohol, tobacco or illicit drugs  Current medications and supplements including opioid prescriptions. Functional ability and status Nutritional status Physical activity Advanced directives List of other physicians Hospitalizations, surgeries, and ER visits in previous 12  months Vitals Screenings to include cognitive, depression, and falls Referrals and appointments  No orders of the defined types were placed in this encounter.  In addition, I have reviewed and discussed with patient certain preventive protocols, quality metrics, and best practice recommendations. A written personalized care plan for preventive services as well as general preventive health recommendations were provided to patient.   Colleen LOISE Fuller, LPN   [id number]/06/7972   Return in about 1 year (around 12/15/2024) for Medicare wellness.  After Visit Summary: (MyChart) Due to this being a telephonic visit, the after visit summary with patients personalized plan was offered to patient via MyChart   Nurse Notes: None at this time.

## 2023-12-16 NOTE — Patient Instructions (Signed)

## 2023-12-23 NOTE — Progress Notes (Signed)
 12/16/2023:  I connected with  Colleen Coffey on 12/23/23 by a video and audio enabled telemedicine application and verified that I am speaking with the correct person using two identifiers.  Patient Location: Home  Provider Location: Office/Clinic  Persons Participating in Visit: Patient.  I discussed the limitations of evaluation and management by telemedicine. The patient expressed understanding and agreed to proceed.   Vital Signs: Because this visit was a virtual/telehealth visit, some criteria may be missing or patient reported. Any vitals not documented were not able to be obtained and vitals that have been documented are patient reported.   If you're able to add these things as option in the [employer], we won't need it ... but for those who refuse to use the template, I guess it does need to be updated

## 2023-12-31 ENCOUNTER — Encounter: Attending: Physical Medicine & Rehabilitation | Admitting: Physical Medicine & Rehabilitation

## 2023-12-31 ENCOUNTER — Encounter: Payer: Self-pay | Admitting: Physical Medicine & Rehabilitation

## 2023-12-31 VITALS — BP 141/81 | HR 84 | Ht 60.0 in | Wt 190.8 lb

## 2023-12-31 DIAGNOSIS — M222X1 Patellofemoral disorders, right knee: Secondary | ICD-10-CM | POA: Diagnosis present

## 2023-12-31 DIAGNOSIS — M222X2 Patellofemoral disorders, left knee: Secondary | ICD-10-CM | POA: Diagnosis not present

## 2023-12-31 DIAGNOSIS — M961 Postlaminectomy syndrome, not elsewhere classified: Secondary | ICD-10-CM | POA: Diagnosis not present

## 2023-12-31 MED ORDER — GABAPENTIN 100 MG PO CAPS: 100.0000 mg | ORAL_CAPSULE | Freq: Two times a day (BID) | ORAL | 1 refills | Status: DC

## 2023-12-31 NOTE — Therapy (Signed)
 OUTPATIENT PHYSICAL THERAPY THORACOLUMBAR EVALUATION   Patient Name: Geniya Fulgham MRN: [id number] DOB:[birth year], 73 y.o., female Today's Date: 01/01/2024  END OF SESSION:  PT End of Session - 01/01/24 1019     Visit Number 1    Date for Recertification  02/26/24    Authorization Type [employer] no auth required    PT Start Time 1020    PT Stop Time 1057    PT Time Calculation (min) 37 min    Activity Tolerance Patient tolerated treatment well    Behavior During Therapy WFL for tasks assessed/performed          Past Medical History:  Diagnosis Date   Constipation    chronic, improved with healthy diet   Endocervical polyp    GERD (gastroesophageal reflux disease)    Hemorrhoids    resolved   Hyperlipidemia    Hypertension    Osteoarthritis, knee 06/27/2015   -saw [facility]    Osteopenia 06/27/2015   -reports on medication remotely with gyn    Past Surgical History:  Procedure Laterality Date   APPENDECTOMY     BACK SURGERY  2014   bulging disc lumbar spine   BREAST CYST ASPIRATION Right    CESAREAN SECTION     2   CHOLECYSTECTOMY N/A 11/16/2020   Procedure: LAPAROSCOPIC CHOLECYSTECTOMY;  Surgeon: Curvin Deward MOULD, MD;  Location: [facility] OR;  Service: General;  Laterality: N/A;   Patient Active Problem List   Diagnosis Date Noted   Trochanteric bursitis of both hips 07/05/2022   Postlaminectomy syndrome, lumbar region 07/05/2022   Sacroiliac dysfunction 07/05/2022   Muscle pain 10/19/2021   Prediabetes 10/19/2021   Hypertension 04/27/2020   Osteoarthritis, knee 06/27/2015   BMI 28.0-28.9,adult 06/27/2015   Constipation 06/27/2015   Osteopenia 06/27/2015    PCP: Ozell Heron HERO, MD   REFERRING PROVIDER: Carilyn Prentice BRAVO, MD   REFERRING DIAG:  M96.1 (ICD-10-CM) - Postlaminectomy syndrome, lumbar region  M22.2X1,M22.2X2 (ICD-10-CM) - Patellofemoral arthralgia of both knees    Rationale for Evaluation and Treatment:  Rehabilitation  THERAPY DIAG:  Radiculopathy, lumbar region  Pain in right lower leg  Muscle weakness (generalized)  Other abnormalities of gait and mobility  ONSET DATE: 2 years ago  SUBJECTIVE:                                                                                                                                                                                           SUBJECTIVE STATEMENT:  In the last couple of years my back started hurting again. Has injections which helps for a while, but then it moves to my  hips, now in my knees. My knees have been bad since 2011. Also gets injections for hips in October. Years since knees have been injected. Back is on/off in back. It's only the R knee that's bothering me, not the left. Uses step to gait on stairs. Hard to put on pair of socks. Stooping very hard.  PERTINENT HISTORY:  Lumbar laminectomy 2014, OA knee, osteopenia  PAIN:  Are you having pain? Yes: NPRS scale: 9/10 in R Pain location: R post knee and down leg  Pain description: tingling achy Aggravating factors: walking and bending Relieving factors: nothing, Tylenol   Are you having pain? Yes: NPRS scale: 0/10 up to 10/10 Pain location: B low back Pain description: achy Aggravating factors: standing, bending Relieving factors: sitting  PRECAUTIONS: None  RED FLAGS: None   WEIGHT BEARING RESTRICTIONS: No  FALLS:  Has patient fallen in last 6 months? No  LIVING ENVIRONMENT: Lives with: grandaughter (19) Lives in: House/apartment Stairs: Yes: Internal: 13 steps; can reach both and External: 4 steps; can reach both Has following equipment at home: None  OCCUPATION: retired  PLOF: Independent with basic ADLs and needs help carrying things on stairs  PATIENT GOALS: Get my right leg working  NEXT MD VISIT: Jan 15, 2024  OBJECTIVE:  Note: Objective measures were completed at Evaluation unless otherwise noted.  DIAGNOSTIC FINDINGS:  XR IMPRESSION: 1.  No fracture or dislocation of the bilateral knees. 2. Severe arthrosis of the bilateral patellofemoral compartments with mild arthrosis of the femorotibial compartments. 3. Small bilateral knee joint effusions.  MRI 2024 Postsurgical changes from L5-S1 posterior spinal fusion. No evidence of hardware complications. There is mild junctional level degenerative change at L4-L5 where there is mild spinal canal and bilateral neural foraminal narrowing. There is also moderate bilateral facet degenerative change at this level with fluid in the facet joints. No evidence of high-grade spinal canal or neural foraminal stenosis.  PATIENT SURVEYS:  Modified Oswestry:  MODIFIED OSWESTRY DISABILITY SCALE  Date: 01/01/24 Score  Pain intensity 3 =  Pain medication provides me with moderate relief from pain.  2. Personal care (washing, dressing, etc.) 2 =  It is painful to take care of myself, and I am slow and careful.  3. Lifting 2 = Pain prevents me from lifting heavy weights off the floor,but I can manage if the weights are conveniently positioned (e.g. on a table)  4. Walking 3 =  Pain prevents me from walking more than  mile.  5. Sitting 1 =  I can only sit in my favorite chair as long as I like.  6. Standing 4 =  Pain prevents me from standing more than 10 minutes.  7. Sleeping 2 =  Even when I take pain medication, I sleep less than 6 hours  8. Social Life 2 = Pain prevents me from participating in more energetic activities (eg. sports, dancing).  9. Traveling 4 = My pain restricts my travel to short necessary journeys under 1/2 hour.  10. Employment/ Homemaking 2 = I can perform most of my homemaking/job duties, but pain prevents me from performing more physically stressful activities (eg, lifting, vacuuming).  Total 25/50   Interpretation of scores: Score Category Description  0-20% Minimal Disability The patient can cope with most living activities. Usually no treatment is indicated apart  from advice on lifting, sitting and exercise  21-40% Moderate Disability The patient experiences more pain and difficulty with sitting, lifting and standing. Travel and social life are more difficult and they  may be disabled from work. Personal care, sexual activity and sleeping are not grossly affected, and the patient can usually be managed by conservative means  41-60% Severe Disability Pain remains the main problem in this group, but activities of daily living are affected. These patients require a detailed investigation  61-80% Crippled Back pain impinges on all aspects of the patient's life. Positive intervention is required  81-100% Bed-bound These patients are either bed-bound or exaggerating their symptoms  Bluford FORBES Zoe DELENA Karon DELENA, et al. Surgery versus conservative management of stable thoracolumbar fracture: the PRESTO feasibility RCT. [place] ([place]): [employer]; 2021 Nov. [facility] Assessment, No. 25.62.) Appendix 3, Oswestry Disability Index category descriptors. Available from: Findjewelers.cz  Minimally Clinically Important Difference (MCID) = 12.8%  COGNITION: Overall cognitive status: Within functional limits for tasks assessed     SENSATION: Light touch: Impaired  R decreased compared to Left  MUSCLE LENGTH: Tightness in  B HS, piriformis,  POSTURE: rounded shoulders and weight shift left  PALPATION: Palpation: TTP at R peroneals.Spinal Mobility: Patellar Mobility: WNL     LUMBAR ROM: Full but pain at end range extension and left rotation  LOWER EXTREMITY ROM:   WFL for tasks assessed  LOWER EXTREMITY MMT:  *pain;  LLE 5/5 except where noted  MMT Right eval Left eval  Hip flexion 4- 4-  Hip extension    Hip abduction    Hip adduction    Hip internal rotation 4   Hip external rotation 4+   Knee flexion 3+* 5  Knee extension 4- 5  Ankle dorsiflexion 5 5  Ankle plantarflexion    Ankle inversion     Ankle eversion 4+    (Blank rows = not tested)  HIP TESTS: Could not tolerated hip scour test, SKTC or FABER on R  FUNCTIONAL TESTS:  5 times sit to stand: 32.90 sec must use hands on knees  GAIT: Antalgic with decreased stance time R  TREATMENT DATE:                                                                                                                               01/01/24  See pt ed and HEP    PATIENT EDUCATION:  Education details: PT eval findings, anticipated POC, initial HEP, and role of TPDN   Person educated: Patient Education method: Explanation, Demonstration, Tactile cues, Verbal cues, and Handouts Education comprehension: verbalized understanding and returned demonstration  HOME EXERCISE PROGRAM: Access Code: [email or web address] URL: [email or web address] Date: 01/01/2024 Prepared by: Mliss  Exercises - Supine Bridge  - 2 x daily - 7 x weekly - 1-3 sets - 10 reps - Seated Hip Internal Rotation AROM  - 2 x daily - 7 x weekly - 2 sets - 10 reps - Seated Long Arc Quad  - 2 x daily - 7 x weekly - 2 sets - 10 reps - 5 sec hold  ASSESSMENT:  CLINICAL IMPRESSION: Patient is a 73  y.o. female who was seen today for physical therapy evaluation and treatment for post-laminectomy syndrome and patellofemoral arthralgia of B knees. She denies L knee pain today. She c/o tingling and aching in the R lower lower leg from the knee down. She does not have pain today from the hip to the knee. She has full lumbar ROM, but pain at end range ext and left rotation. She has significant R LE weakness and pain with R hip mobility during testing. Pain is affecting her ability to walk, bend and climb stairs. Her Modified Oswestry score indicates moderate disability. She will benefit from skilled PT to address these deficits and those listed below.   OBJECTIVE IMPAIRMENTS: Abnormal gait, decreased activity tolerance, decreased knowledge of condition, decreased ROM, decreased  strength, increased muscle spasms, impaired flexibility, postural dysfunction, and pain.   ACTIVITY LIMITATIONS: bending, standing, squatting, stairs, transfers, bed mobility, dressing, and locomotion level  PARTICIPATION LIMITATIONS: meal prep, cleaning, laundry, shopping, community activity, yard work, and church  PERSONAL FACTORS: Age, Fitness, Time since onset of injury/illness/exacerbation, and 1-2 comorbidities: OA anc chronic LBP are also affecting patient's functional outcome.   REHAB POTENTIAL: Good  CLINICAL DECISION MAKING: Evolving/moderate complexity  EVALUATION COMPLEXITY: Moderate   GOALS: Goals reviewed with patient? Yes  SHORT TERM GOALS: Target date: 01/29/2024   Patient will be independent with initial HEP.  Baseline:  Goal status: INITIAL  2.  Patient will report centralization of radicular symptoms.  Baseline:  Goal status: INITIAL  3.  Decreased pain by 30% with standing and walking Baseline:  Goal status: INITIAL   LONG TERM GOALS: Target date: 02/26/2024   Patient will be independent with advanced/ongoing HEP to improve outcomes and carryover.  Baseline:  Goal status: INITIAL  2.  Patient will report 75% improvement in pain with ADLs to improve QOL.  Baseline:  Goal status: INITIAL  3. Improved 5XSTS by 2-3 seconds showing functional improvement in strength.  Baseline:  Goal status: INITIAL  4.  Patient will demonstrate improved strength to 4+/5 to normalize body mechanics. Baseline:  Goal status: INITIAL  5.  Patient will score <= 19 on the Modified Oswestry demonstrating improved functional ability.  Baseline: 25 Goal status: INITIAL  6.  Patient able to walk with a normal gait pattern. Baseline:  Goal status: INITIAL  7.  Patient able to climb stairs with a reciprocal gait pattern.  Baseline: step to gait Goal status: INITIAL    PLAN:  PT FREQUENCY: 2x/week  PT DURATION: 8 weeks  PLANNED INTERVENTIONS: 97110-Therapeutic  exercises, 97530- Therapeutic activity, 97112- Neuromuscular re-education, 97535- Self Care, 02859- Manual therapy, [phone]- Gait training, [phone]- Aquatic Therapy, [phone]- Electrical stimulation (unattended), [id number]- Ultrasound, [id number]- Ionotophoresis 4mg /ml Dexamethasone , 79439 (1-2 muscles), 20561 (3+ muscles)- Dry Needling, Patient/Family education, Stair training, Taping, Joint mobilization, Spinal mobilization, Cryotherapy, and Moist heat.  PLAN FOR NEXT SESSION: Review and progress HEP, hip flexibility and strengthening, gait,  manual/Addaday/DN to R lumbar/gluteals/LE, spinal mobs  Mliss Cummins, PT 01/01/24 4:09 PM

## 2023-12-31 NOTE — Patient Instructions (Addendum)
 Diclofenac  Gel What is this medication? DICLOFENAC  (dye KLOE fen ak) treats joint pain caused by arthritis. It works by decreasing inflammation. It belongs to a group of medications called NSAIDs. This medicine may be used for other purposes; ask your health care provider or pharmacist if you have questions. COMMON BRAND NAME(S): Aspercreme Arthritis Pain Reliever, DermacinRx VennGel, Diclofono, DICLOPREP, DSG Pak, Motrin Arthritis Pain, Omeca, Solaravix, Solaraze , ValcoPrep-100, VennGel One , Voltaren  Arthritis, Voltaren  Gel What should I tell my care team before I take this medication? They need to know if you have any of these conditions: Asthma Bleeding problems Dehydration Frequently drink alcohol Have had a heart attack, stroke, or mini-stroke Heart bypass surgery, or CABG, within the past 2 weeks Heart or blood vessel conditions Heart failure High blood pressure Kidney disease Large areas of burned or damaged skin Liver disease Skin conditions Stomach bleeding Stomach ulcers, other stomach or intestine problems Tobacco use An unusual or allergic reaction to diclofenac , other medications, foods, dyes, or preservatives Pregnant or trying to get pregnant Breastfeeding How should I use this medication? This medication is for external use only. Do not take by mouth. Wash your hands before and after use. If you are treating your hands, only wash your hands before use. Do not get it in your eyes. If you do, rinse your eyes with plenty of cool tap water. Use it as directed on the prescription label at the same time every day. Do not use it more often than directed. Keep taking it unless your care team tells you to stop. Apply a thin film of the medication to the affected area. If you are using Voltaren  or Venngel One , they come with INSTRUCTIONS FOR USE. Ask your pharmacist for directions on how to use this medication. Read the information carefully. Talk to your pharmacist or care team if  you have questions. A special MedGuide will be given to you by the pharmacist with each prescription and refill. Be sure to read this information carefully each time. Talk to your care team about the use of this medication in children. Special care may be needed. Overdosage: If you think you have taken too much of this medicine contact a poison control center or emergency room at once. NOTE: This medicine is only for you. Do not share this medicine with others. What if I miss a dose? If you are using Voltaren  or Venngel One : If you miss a dose, skip it. Use your next dose at the normal time. Do not use extra or 2 doses at the same time to make up for the missed dose. If you are using Solaraze : If you miss a dose, use it as soon as you can. If it is almost time for your next dose, use only that dose. Do not use double or extra doses. What may interact with this medication? Do not take this medication with any of the following: Cidofovir Ketorolac This medication may also interact with the following: Alcohol Aspirin and aspirin-like medications Blood thinners Cyclosporine Digoxin Diuretics Lithium Medications for blood pressure Methotrexate Other NSAIDs, medications for pain and inflammation, such as ibuprofen or naproxen Other products used on the skin Some medications for depression Steroid medications, such as prednisone or cortisone Supplements, such as garlic, ginger, ginkgo, methylsulfonylmethane (MSM) This list may not describe all possible interactions. Give your health care provider a list of all the medicines, herbs, non-prescription drugs, or dietary supplements you use. Also tell them if you smoke, drink alcohol, or use illegal drugs.  Some items may interact with your medicine. What should I watch for while using this medication? Visit your care team for regular checks on your progress. Tell your care team if your symptoms do not start to get better or if they get worse. Do  not take aspirin or other NSAIDs, such as ibuprofen or naproxen, while you are taking this medication. Side effects, such as upset stomach, nausea, and ulcers, may be more likely to occur. Many over-the-counter medications contain aspirin, ibuprofen, or naproxen. It is important to read labels carefully. Talk to your care team about all the medications you take. They can tell you what is safe to take together. This medication can cause serious bleeding, ulcers, or tears in the stomach. These problems can occur at any time and with no warning signs. They are more common with long-term use. Talk to your care team right away if you have stomach pain, bloody or black, tar-like stools, or vomit blood that is red or looks like coffee grounds. This medication increases the risk of blood clots, heart attack, and stroke. These events can occur at any time. They are more common with long-term use and in those who have heart disease. If you take aspirin to prevent a heart attack or stroke, talk to your care team. They can help you find an option that works for you. This medication may cause serious skin reactions. They can happen weeks to months after starting the medication. Talk to your care team right away if you have fevers or flu-like symptoms with a rash. The rash may be red or purple and then turn into blisters or peeling of the skin. Or you might notice a red rash with swelling of the face, lips, or lymph nodes in your neck or under your arms. Talk to your care team if you may be pregnant. Taking this medication after 20 weeks of pregnancy may cause serious birth defects. Use of this medication after 30 weeks of pregnancy is not recommended. This medication may cause infertility. It is usually temporary. Talk to your care team if you are concerned about your fertility. What side effects may I notice from receiving this medication? Side effects that you should report to your care team as soon as  possible: Allergic reactions--skin rash, itching, hives, swelling of the face, lips, tongue, or throat Bleeding--bloody or black, tar-like stools, vomiting blood or brown material that looks like coffee grounds, red or dark brown urine, small red or purple spots on skin, unusual bruising or bleeding Heart attack--pain or tightness in the chest, shoulders, arms, or jaw, nausea, shortness of breath, cold or clammy skin, feeling faint or lightheaded Heart failure--shortness of breath, swelling of the ankles, feet, or hands, sudden weight gain, unusual weakness or fatigue Increase in blood pressure Kidney injury--decrease in the amount of urine, swelling of the ankles, hands, or feet Liver injury--right upper belly pain, loss of appetite, nausea, light-colored stool, dark yellow or brown urine, yellowing skin or eyes, unusual weakness or fatigue Rash, fever, and swollen lymph nodes Redness, blistering, peeling, or loosening of the skin, including inside the mouth Round red or dark patches on the skin that may itch, burn, and blister Stroke--sudden numbness or weakness of the face, arm, or leg, trouble speaking, confusion, trouble walking, loss of balance or coordination, dizziness, severe headache, change in vision Side effects that usually do not require medical attention (report these to your care team if they continue or are bothersome): Irritation at application site This list  may not describe all possible side effects. Call your doctor for medical advice about side effects. You may report side effects to FDA at 1-800-FDA-1088. Where should I keep my medication? Keep out of the reach of children and pets. Store at room temperature between 15 and 30 degrees C (59 and 86 degrees F). Do not freeze. Protect from heat. Get rid of any unused medication after the expiration date. To get rid of medications that are no longer needed or have expired: Take the medication to a medication take-back program.  Check with your pharmacy or law enforcement to find a location. If you cannot return the medication, check the label or package insert to see if the medication should be thrown out in the garbage or flushed down the toilet. If you are not sure, ask your care team. If it is safe to put it in the trash, empty the medication out of the container. Mix the medication with cat litter, dirt, coffee grounds, or other unwanted substance. Seal the mixture in a bag or container. Put it in the trash. NOTE: This sheet is a summary. It may not cover all possible information. If you have questions about this medicine, talk to your doctor, pharmacist, or health care provider.  2025 Elsevier/Gold Standard (2023-04-12 00:00:00)   VISIT SUMMARY: During your visit, we discussed your worsening right leg pain and numbness, as well as your bilateral knee pain. We have made some changes to your medications and recommended physical therapy to help manage your symptoms.  YOUR PLAN: RIGHT-SIDED LUMBAR RADICULOPATHY WITH POSTLAMINECTOMY SYNDROME: You have chronic right-sided lumbar radiculopathy with postlaminectomy syndrome due to mild L4-5 nerve narrowing, causing radiating leg pain, numbness, and tingling. -Start taking gabapentin  100 mg twice daily. -You are referred to physical therapy for sciatic and knee pain management.  TROCHANTERIC BURSITIS, LATERAL HIP PAIN: You have lateral hip pain consistent with trochanteric bursitis. -Continue with your current management plan.  PATELLOFEMORAL ARTHRITIS AND EFFUSION, RIGHT KNEE: You have right knee pain with patellofemoral arthritis and effusion, causing swelling and pain. -Use over-the-counter diclofenac  gel for topical pain relief. -You are referred to physical therapy for knee and sciatic pain management. -Continue taking 1300 mg acetaminophen  twice daily.                      Contains text generated by Abridge.                                  Contains text generated by Abridge.

## 2023-12-31 NOTE — Progress Notes (Signed)
 Subjective:    Patient ID: Colleen Coffey, female    DOB: 1950-11-06, 73 y.o.   MRN: [id number]  HPI Discussed the use of AI scribe software for clinical note transcription with the patient, who gave verbal consent to proceed.  History of Present Illness Colleen Coffey is a 73 year old female with lumbar fusion and knee arthritis who presents with worsening right leg pain and numbness.  She experiences worsening pain in her right leg, described as shooting from the hip to the foot, accompanied by numbness and tingling. This pain has been present since her last visit in October and differs from her previous hip pain, which was managed with hip injections for trochanteric bursitis and sacroiliac pain. She has a history of lumbar fusion at L5-S1, and recent imaging showed mild narrowing of the nerves at L4-5, worse on the right side. She has not been on gabapentin  before but is familiar with it as her daughter's dog was prescribed it.  Additionally, she experiences bilateral knee pain, more pronounced on the right side, with swelling and fluid accumulation behind the knee. She describes the pain as 'like a balloon' expanding and causing pressure. She has been taking acetaminophen  1300 mg twice daily, which provides some relief. Her current medications include trazodone  for sleep. She has not used Voltaren  gel or undergone knee injections previously. She is open to trying physical therapy and other treatments to manage her symptoms.  Reports numbness and tingling in the right leg, particularly below the knee. Describes the right leg as feeling different from the left, with tingling sensations.   Pain Inventory Average Pain 6 Pain Right Now 8 My pain is constant, burning, dull, tingling, and aching  In the last 24 hours, has pain interfered with the following? General activity 8 Relation with others 0 Enjoyment of life 9 What TIME of day is your pain at its worst? morning  and  evening Sleep (in general) Fair  Pain is worse with: walking, bending, standing, and some activites Pain improves with: rest, pacing activities, medication, and TENS Relief from Meds: 5  Family History  Problem Relation Age of Onset   High blood pressure Mother    High Cholesterol Mother    Dementia Mother    Arthritis Mother    Heart disease Mother    Syncope episode Mother    High blood pressure Father    COPD Father    High blood pressure Sister    Parkinson's disease Sister    Arthritis Sister    High blood pressure Maternal Grandmother    High Cholesterol Maternal Grandmother    Dementia Maternal Grandmother    Arthritis Maternal Grandmother    COPD Brother    High blood pressure Brother    Hyperlipidemia Other    Hypertension Other    Depression Other    Esophageal cancer Neg Hx    Colon cancer Neg Hx    Pancreatic cancer Neg Hx    Stomach cancer Neg Hx    Liver disease Neg Hx    Breast cancer Neg Hx    Social History   Socioeconomic History   Marital status: Widowed    Spouse name: Not on file   Number of children: 2   Years of education: Not on file   Highest education level: Some college, no degree  Occupational History   Occupation: Retired  Tobacco Use   Smoking status: Never   Smokeless tobacco: Never  Vaping Use   Vaping  status: Never Used  Substance and Sexual Activity   Alcohol use: No   Drug use: No   Sexual activity: Not Currently    Birth control/protection: Post-menopausal  Other Topics Concern   Not on file  Social History Narrative   Work or School: retired, used to be a merchandiser, retail for [employer]      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; as a software engineer; husband and mother passed in 2013      Spiritual Beliefs: Christian      Lifestyle: exercising; eating healthy      Social Drivers of Health   Financial Resource Strain: Low Risk  (12/15/2023)   Overall Financial Resource Strain (CARDIA)    Difficulty of Paying Living Expenses: Not very  hard  Food Insecurity: No Food Insecurity (12/16/2023)   Hunger Vital Sign    Worried About Running Out of Food in the Last Year: Never true    Ran Out of Food in the Last Year: Never true  Transportation Needs: No Transportation Needs (12/16/2023)   PRAPARE - Administrator, Civil Service (Medical): No    Lack of Transportation (Non-Medical): No  Physical Activity: Insufficiently Active (12/16/2023)   Exercise Vital Sign    Days of Exercise per Week: 2 days    Minutes of Exercise per Session: 30 min  Stress: No Stress Concern Present (12/16/2023)   [employer] of Occupational Health - Occupational Stress Questionnaire    Feeling of Stress: Only a little  Social Connections: Moderately Integrated (12/16/2023)   Social Connection and Isolation Panel    Frequency of Communication with Friends and Family: More than three times a week    Frequency of Social Gatherings with Friends and Family: Twice a week    Attends Religious Services: More than 4 times per year    Active Member of [employer] or Organizations: Yes    Attends Banker Meetings: More than 4 times per year    Marital Status: Widowed   Past Surgical History:  Procedure Laterality Date   APPENDECTOMY     BACK SURGERY  2014   bulging disc lumbar spine   BREAST CYST ASPIRATION Right    CESAREAN SECTION     2   CHOLECYSTECTOMY N/A 11/16/2020   Procedure: LAPAROSCOPIC CHOLECYSTECTOMY;  Surgeon: Curvin Deward MOULD, MD;  Location: [facility] OR;  Service: General;  Laterality: N/A;   Past Surgical History:  Procedure Laterality Date   APPENDECTOMY     BACK SURGERY  2014   bulging disc lumbar spine   BREAST CYST ASPIRATION Right    CESAREAN SECTION     2   CHOLECYSTECTOMY N/A 11/16/2020   Procedure: LAPAROSCOPIC CHOLECYSTECTOMY;  Surgeon: Curvin Deward MOULD, MD;  Location: [facility] OR;  Service: General;  Laterality: N/A;   Past Medical History:  Diagnosis Date   Constipation    chronic, improved with healthy diet    Endocervical polyp    GERD (gastroesophageal reflux disease)    Hemorrhoids    resolved   Hyperlipidemia    Hypertension    Osteoarthritis, knee 06/27/2015   -saw [facility]    Osteopenia 06/27/2015   -reports on medication remotely with gyn    BP (!) 141/81   Pulse 84   Ht 5' (1.524 m)   Wt 190 lb 12.8 oz (86.5 kg)   LMP 01/11/2017 (Exact Date)   SpO2 98%   BMI 37.26 kg/m   Opioid Risk Score:   Fall Risk Score:  `  1  Depression screen [facility] 2/9     12/31/2023   10:26 AM 12/16/2023   11:57 AM 08/09/2023    9:53 AM 06/11/2023   10:10 AM 04/22/2023    8:43 AM 12/10/2022    4:02 PM 10/22/2022    8:24 AM  Depression screen PHQ 2/9  Decreased Interest 1 0 0 0 0 0 0  Down, Depressed, Hopeless 1 0 0 0 0 0 1  PHQ - 2 Score 2 0 0 0 0 0 1  Altered sleeping  0   0 0 2  Tired, decreased energy  0   0 0 1  Change in appetite  0   0 0 0  Feeling bad or failure about yourself   0   0 0 0  Trouble concentrating  0   0 0 0  Moving slowly or fidgety/restless  0   0 0 0  Suicidal thoughts  0   0 0 0  PHQ-9 Score  0    0  0  4   Difficult doing work/chores  Not difficult at all    Not difficult at all Not difficult at all     Data saved with a previous flowsheet row definition     Review of Systems  Musculoskeletal:  Positive for back pain and gait problem.       Bilateral knees   Psychiatric/Behavioral:  Positive for dysphoric mood.   All other systems reviewed and are negative.      Objective:   Physical Exam  General no acute distress, obesity 2 mood and affect are appropriate Motor strength is 4/5 in the right hip flexor knee extensor ankle dorsiflexor due to pain inhibition. Left lower limb 5/5 in the hip flexor knee extensor ankle dorsiflexion Positive straight leg raising right lower extremity Deep tendon reflexes 1+ right patellar 2+ left patellar 1+ bilateral ankles Sensation reduced right L4 dermatomal distribution to light touch.   Sacral thrust (prone) :  Negative Lateral compression: Positive at the greater trochanter but negative at the SI FABER's: Negative Distraction (supine): Negative Thigh thrust test: Negative  Right knee no evidence of effusion there is tenderness to palpation over the lateral aspect of the patella.  There is no popliteal cyst, limited flexion to 90 degrees related to pain has full extension passively Left knee no evidence of effusion no evidence of tenderness there is no popliteal cyst, full range of motion Lumbar spine has reduced flexion and extension around 50%     Assessment & Plan:  Assessment and Plan Assessment & Plan Right-sided lumbar radiculopathy with postlaminectomy syndrome Chronic right-sided lumbar radiculopathy with postlaminectomy syndrome due to mild L4-5 nerve narrowing. Symptoms include radiating leg pain, numbness, and tingling. No surgical intervention needed. - Prescribed gabapentin  100 mg twice daily. - Referred to physical therapy for sciatic and knee pain management.  Trochanteric bursitis, lateral hip pain Lateral hip pain consistent with trochanteric bursitis. Previous ultrasound-guided injections effective. - Continue current management.  Patellofemoral arthritis and effusion, right knee Right knee pain with patellofemoral arthritis and effusion. Symptoms include swelling and pain, possibly linked to sciatic nerve involvement. - Recommended over-the-counter diclofenac  gel for topical pain relief. - Referred to physical therapy for knee and sciatic pain management. - Continue 1300 mg acetaminophen  twice daily. Return to clinic in 6 weeks The patient has not made any improvements with physical therapy we can determine whether further treatment using gel injections for the knee versus L4-5 transforaminal epidural steroid injection for her  back and sciatic pain depending on symptom pathology at that time

## 2024-01-01 ENCOUNTER — Encounter: Payer: Self-pay | Admitting: Physical Therapy

## 2024-01-01 ENCOUNTER — Other Ambulatory Visit: Payer: Self-pay

## 2024-01-01 ENCOUNTER — Ambulatory Visit: Attending: Physical Medicine & Rehabilitation | Admitting: Physical Therapy

## 2024-01-01 DIAGNOSIS — M5416 Radiculopathy, lumbar region: Secondary | ICD-10-CM | POA: Insufficient documentation

## 2024-01-01 DIAGNOSIS — M6281 Muscle weakness (generalized): Secondary | ICD-10-CM | POA: Diagnosis present

## 2024-01-01 DIAGNOSIS — M222X1 Patellofemoral disorders, right knee: Secondary | ICD-10-CM | POA: Insufficient documentation

## 2024-01-01 DIAGNOSIS — R2689 Other abnormalities of gait and mobility: Secondary | ICD-10-CM | POA: Insufficient documentation

## 2024-01-01 DIAGNOSIS — M961 Postlaminectomy syndrome, not elsewhere classified: Secondary | ICD-10-CM | POA: Diagnosis not present

## 2024-01-01 DIAGNOSIS — M79661 Pain in right lower leg: Secondary | ICD-10-CM | POA: Insufficient documentation

## 2024-01-01 DIAGNOSIS — M222X2 Patellofemoral disorders, left knee: Secondary | ICD-10-CM | POA: Diagnosis not present

## 2024-01-02 ENCOUNTER — Other Ambulatory Visit: Payer: Self-pay | Admitting: Family Medicine

## 2024-01-02 DIAGNOSIS — I10 Essential (primary) hypertension: Secondary | ICD-10-CM

## 2024-01-06 NOTE — Therapy (Signed)
 OUTPATIENT PHYSICAL THERAPY THORACOLUMBAR TREATMENT   Patient Name: Colleen Coffey MRN: [id number] DOB:Jul 03, 1950, 73 y.o., female Today's Date: 01/07/2024  END OF SESSION:  PT End of Session - 01/07/24 1018     Visit Number 2    Date for Recertification  02/26/24    Authorization Type [employer] no auth required    PT Start Time 1019    PT Stop Time 1058    PT Time Calculation (min) 39 min    Activity Tolerance Patient tolerated treatment well    Behavior During Therapy WFL for tasks assessed/performed           Past Medical History:  Diagnosis Date   Constipation    chronic, improved with healthy diet   Endocervical polyp    GERD (gastroesophageal reflux disease)    Hemorrhoids    resolved   Hyperlipidemia    Hypertension    Osteoarthritis, knee 06/27/2015   -saw [facility]    Osteopenia 06/27/2015   -reports on medication remotely with gyn    Past Surgical History:  Procedure Laterality Date   APPENDECTOMY     BACK SURGERY  2014   bulging disc lumbar spine   BREAST CYST ASPIRATION Right    CESAREAN SECTION     2   CHOLECYSTECTOMY N/A 11/16/2020   Procedure: LAPAROSCOPIC CHOLECYSTECTOMY;  Surgeon: Curvin Deward MOULD, MD;  Location: [facility] OR;  Service: General;  Laterality: N/A;   Patient Active Problem List   Diagnosis Date Noted   Trochanteric bursitis of both hips 07/05/2022   Postlaminectomy syndrome, lumbar region 07/05/2022   Sacroiliac dysfunction 07/05/2022   Muscle pain 10/19/2021   Prediabetes 10/19/2021   Hypertension 04/27/2020   Osteoarthritis, knee 06/27/2015   BMI 28.0-28.9,adult 06/27/2015   Constipation 06/27/2015   Osteopenia 06/27/2015    PCP: Ozell Heron HERO, MD   REFERRING PROVIDER: Carilyn Prentice BRAVO, MD   REFERRING DIAG:  M96.1 (ICD-10-CM) - Postlaminectomy syndrome, lumbar region  M22.2X1,M22.2X2 (ICD-10-CM) - Patellofemoral arthralgia of both knees    Rationale for Evaluation and Treatment:  Rehabilitation  THERAPY DIAG:  Radiculopathy, lumbar region  Pain in right lower leg  Muscle weakness (generalized)  ONSET DATE: 2 years ago  SUBJECTIVE:                                                                                                                                                                                           SUBJECTIVE STATEMENT: I can do about 5 reps of the exercises. They put me on Gabapentin  and it's helping some.   Eval:  In the last couple of years my back started  hurting again. Has injections which helps for a while, but then it moves to my hips, now in my knees. My knees have been bad since 2011. Also gets injections for hips in October. Years since knees have been injected. Back is on/off in back. It's only the R knee that's bothering me, not the left. Uses step to gait on stairs. Hard to put on pair of socks. Stooping very hard.  PERTINENT HISTORY:  Lumbar laminectomy 2014, OA knee, osteopenia  PAIN: 01/07/2024  Are you having pain? Yes: NPRS scale: 5/10 in R Pain location: R post knee and down leg  Pain description: tingling achy Aggravating factors: walking and bending Relieving factors: nothing, Tylenol   Are you having pain? Yes: NPRS scale: 0/10 up to 10/10 Pain location: B low back Pain description: achy Aggravating factors: standing, bending Relieving factors: sitting  PRECAUTIONS: None  RED FLAGS: None   WEIGHT BEARING RESTRICTIONS: No  FALLS:  Has patient fallen in last 6 months? No  LIVING ENVIRONMENT: Lives with: grandaughter (19) Lives in: House/apartment Stairs: Yes: Internal: 13 steps; can reach both and External: 4 steps; can reach both Has following equipment at home: None  OCCUPATION: retired  PLOF: Independent with basic ADLs and needs help carrying things on stairs  PATIENT GOALS: Get my right leg working  NEXT MD VISIT: Jan 15, 2024  OBJECTIVE:  Note: Objective measures were completed at Evaluation  unless otherwise noted.  DIAGNOSTIC FINDINGS:  XR IMPRESSION: 1. No fracture or dislocation of the bilateral knees. 2. Severe arthrosis of the bilateral patellofemoral compartments with mild arthrosis of the femorotibial compartments. 3. Small bilateral knee joint effusions.  MRI 2024 Postsurgical changes from L5-S1 posterior spinal fusion. No evidence of hardware complications. There is mild junctional level degenerative change at L4-L5 where there is mild spinal canal and bilateral neural foraminal narrowing. There is also moderate bilateral facet degenerative change at this level with fluid in the facet joints. No evidence of high-grade spinal canal or neural foraminal stenosis.  PATIENT SURVEYS:  Modified Oswestry:  MODIFIED OSWESTRY DISABILITY SCALE  Date: 01/01/24 Score  Pain intensity 3 =  Pain medication provides me with moderate relief from pain.  2. Personal care (washing, dressing, etc.) 2 =  It is painful to take care of myself, and I am slow and careful.  3. Lifting 2 = Pain prevents me from lifting heavy weights off the floor,but I can manage if the weights are conveniently positioned (e.g. on a table)  4. Walking 3 =  Pain prevents me from walking more than  mile.  5. Sitting 1 =  I can only sit in my favorite chair as long as I like.  6. Standing 4 =  Pain prevents me from standing more than 10 minutes.  7. Sleeping 2 =  Even when I take pain medication, I sleep less than 6 hours  8. Social Life 2 = Pain prevents me from participating in more energetic activities (eg. sports, dancing).  9. Traveling 4 = My pain restricts my travel to short necessary journeys under 1/2 hour.  10. Employment/ Homemaking 2 = I can perform most of my homemaking/job duties, but pain prevents me from performing more physically stressful activities (eg, lifting, vacuuming).  Total 25/50   Interpretation of scores: Score Category Description  0-20% Minimal Disability The patient can cope  with most living activities. Usually no treatment is indicated apart from advice on lifting, sitting and exercise  21-40% Moderate Disability The patient experiences more pain  and difficulty with sitting, lifting and standing. Travel and social life are more difficult and they may be disabled from work. Personal care, sexual activity and sleeping are not grossly affected, and the patient can usually be managed by conservative means  41-60% Severe Disability Pain remains the main problem in this group, but activities of daily living are affected. These patients require a detailed investigation  61-80% Crippled Back pain impinges on all aspects of the patient's life. Positive intervention is required  81-100% Bed-bound These patients are either bed-bound or exaggerating their symptoms  Bluford FORBES Zoe DELENA Karon DELENA, et al. Surgery versus conservative management of stable thoracolumbar fracture: the PRESTO feasibility RCT. [place] ([place]): [employer]; 2021 Nov. [facility] Technology Assessment, No. 25.62.) Appendix 3, Oswestry Disability Index category descriptors. Available from: Findjewelers.cz  Minimally Clinically Important Difference (MCID) = 12.8%  COGNITION: Overall cognitive status: Within functional limits for tasks assessed     SENSATION: Light touch: Impaired  R decreased compared to Left  MUSCLE LENGTH: Tightness in  B HS, piriformis,  POSTURE: rounded shoulders and weight shift left  PALPATION: Palpation: TTP at R peroneals.Spinal Mobility: NT Patellar Mobility: WNL     LUMBAR ROM: Full but pain at end range extension and left rotation  LOWER EXTREMITY ROM:   WFL for tasks assessed  LOWER EXTREMITY MMT:  *pain;  LLE 5/5 except where noted  MMT Right eval Left eval  Hip flexion 4- 4-  Hip extension    Hip abduction    Hip adduction    Hip internal rotation 4   Hip external rotation 4+   Knee flexion 3+* 5  Knee extension 4- 5   Ankle dorsiflexion 5 5  Ankle plantarflexion    Ankle inversion    Ankle eversion 4+    (Blank rows = not tested)  HIP TESTS: Could not tolerated hip scour test, SKTC or FABER on R  FUNCTIONAL TESTS:  5 times sit to stand: 32.90 sec must use hands on knees  GAIT: Antalgic with decreased stance time R  TREATMENT DATE:                                                                                                                               01/07/24 Nustep L5 x 5 Seated HS stretch 2x20 sec B Seated fig 4 L;  (R side modified pigeon) 3 x 20 sec B Hip IR x 10 LAQ 3# 2x10 Seated hip up and overs (scissors on floor) 2 x 10 B  - go higher next visit Seated HS curl green 2x10 L  Seated clam green 2x10 Sit to stand with green band x 5 painful Supine SLR x 10 B then 1x5 R    01/01/24  See pt ed and HEP    PATIENT EDUCATION:  Education details: PT eval findings, anticipated POC, initial HEP, and role of TPDN   Person educated: Patient Education method: Explanation, Demonstration, Tactile cues, Verbal  cues, and Handouts Education comprehension: verbalized understanding and returned demonstration  HOME EXERCISE PROGRAM: Access Code: [email or web address] URL: [email or web address] Date: 01/01/2024 Prepared by: Mliss  Exercises - Supine Bridge  - 2 x daily - 7 x weekly - 1-3 sets - 10 reps - Seated Hip Internal Rotation AROM  - 2 x daily - 7 x weekly - 2 sets - 10 reps - Seated Long Arc Quad  - 2 x daily - 7 x weekly - 2 sets - 10 reps - 5 sec hold  ASSESSMENT:  CLINICAL IMPRESSION: Patient tolerated initial session fairly well. Sit to stands are too painful. SLR is painful, but pt tolerated. HEP updated. Advised pt to try massage gun at home to R gastroc and peroneals. Cancer gun was too aggressive.  Eval: Patient is a 73 y.o. female who was seen today for physical therapy evaluation and treatment for post-laminectomy syndrome and patellofemoral arthralgia of B knees.  She denies L knee pain today. She c/o tingling and aching in the R lower lower leg from the knee down. She does not have pain today from the hip to the knee. She has full lumbar ROM, but pain at end range ext and left rotation. She has significant R LE weakness and pain with R hip mobility during testing. Pain is affecting her ability to walk, bend and climb stairs. Her Modified Oswestry score indicates moderate disability. She will benefit from skilled PT to address these deficits and those listed below.   OBJECTIVE IMPAIRMENTS: Abnormal gait, decreased activity tolerance, decreased knowledge of condition, decreased ROM, decreased strength, increased muscle spasms, impaired flexibility, postural dysfunction, and pain.   ACTIVITY LIMITATIONS: bending, standing, squatting, stairs, transfers, bed mobility, dressing, and locomotion level  PARTICIPATION LIMITATIONS: meal prep, cleaning, laundry, shopping, community activity, yard work, and church  PERSONAL FACTORS: Age, Fitness, Time since onset of injury/illness/exacerbation, and 1-2 comorbidities: OA anc chronic LBP are also affecting patient's functional outcome.   REHAB POTENTIAL: Good  CLINICAL DECISION MAKING: Evolving/moderate complexity  EVALUATION COMPLEXITY: Moderate   GOALS: Goals reviewed with patient? Yes  SHORT TERM GOALS: Target date: 01/29/2024   Patient will be independent with initial HEP.  Baseline:  Goal status: INITIAL  2.  Patient will report centralization of radicular symptoms.  Baseline:  Goal status: INITIAL  3.  Decreased pain by 30% with standing and walking Baseline:  Goal status: INITIAL   LONG TERM GOALS: Target date: 02/26/2024   Patient will be independent with advanced/ongoing HEP to improve outcomes and carryover.  Baseline:  Goal status: INITIAL  2.  Patient will report 75% improvement in pain with ADLs to improve QOL.  Baseline:  Goal status: INITIAL  3. Improved 5XSTS by 2-3 seconds  showing functional improvement in strength.  Baseline:  Goal status: INITIAL  4.  Patient will demonstrate improved strength to 4+/5 to normalize body mechanics. Baseline:  Goal status: INITIAL  5.  Patient will score <= 19 on the Modified Oswestry demonstrating improved functional ability.  Baseline: 25 Goal status: INITIAL  6.  Patient able to walk with a normal gait pattern. Baseline:  Goal status: INITIAL  7.  Patient able to climb stairs with a reciprocal gait pattern.  Baseline: step to gait Goal status: INITIAL    PLAN:  PT FREQUENCY: 2x/week  PT DURATION: 8 weeks  PLANNED INTERVENTIONS: 97110-Therapeutic exercises, 97530- Therapeutic activity, [id number]- Neuromuscular re-education, 97535- Self Care, 02859- Manual therapy, [id number]- Gait training, [id number]- Aquatic Therapy, [phone]- Electrical stimulation (unattended), 97035- Ultrasound, 02966- Ionotophoresis 4mg /ml  Dexamethasone , 20560 (1-2 muscles), 20561 (3+ muscles)- Dry Needling, Patient/Family education, Stair training, Taping, Joint mobilization, Spinal mobilization, Cryotherapy, and Moist heat.  PLAN FOR NEXT SESSION: Review and progress HEP, hip flexibility and strengthening, gait,  manual/Addaday/DN to R lumbar/gluteals/LE, spinal mobs  Mliss Cummins, PT 01/07/24 11:03 AM

## 2024-01-07 ENCOUNTER — Ambulatory Visit: Payer: Self-pay | Admitting: Physical Therapy

## 2024-01-07 ENCOUNTER — Encounter: Payer: Self-pay | Admitting: Physical Therapy

## 2024-01-07 DIAGNOSIS — M79661 Pain in right lower leg: Secondary | ICD-10-CM

## 2024-01-07 DIAGNOSIS — M5416 Radiculopathy, lumbar region: Secondary | ICD-10-CM

## 2024-01-07 DIAGNOSIS — M6281 Muscle weakness (generalized): Secondary | ICD-10-CM

## 2024-01-11 ENCOUNTER — Other Ambulatory Visit: Payer: Self-pay | Admitting: Family Medicine

## 2024-01-11 DIAGNOSIS — I10 Essential (primary) hypertension: Secondary | ICD-10-CM

## 2024-01-13 ENCOUNTER — Encounter: Payer: Self-pay | Admitting: Family Medicine

## 2024-01-13 ENCOUNTER — Ambulatory Visit: Payer: Self-pay | Attending: Physical Medicine & Rehabilitation

## 2024-01-13 DIAGNOSIS — R2689 Other abnormalities of gait and mobility: Secondary | ICD-10-CM | POA: Diagnosis present

## 2024-01-13 DIAGNOSIS — M5416 Radiculopathy, lumbar region: Secondary | ICD-10-CM | POA: Insufficient documentation

## 2024-01-13 DIAGNOSIS — M79661 Pain in right lower leg: Secondary | ICD-10-CM | POA: Insufficient documentation

## 2024-01-13 DIAGNOSIS — E782 Mixed hyperlipidemia: Secondary | ICD-10-CM

## 2024-01-13 DIAGNOSIS — R262 Difficulty in walking, not elsewhere classified: Secondary | ICD-10-CM | POA: Insufficient documentation

## 2024-01-13 DIAGNOSIS — M6281 Muscle weakness (generalized): Secondary | ICD-10-CM | POA: Insufficient documentation

## 2024-01-13 DIAGNOSIS — M5459 Other low back pain: Secondary | ICD-10-CM | POA: Diagnosis present

## 2024-01-13 NOTE — Therapy (Signed)
 OUTPATIENT PHYSICAL THERAPY THORACOLUMBAR TREATMENT   Patient Name: Colleen Coffey MRN: [id number] DOB:17-Jan-1951, 73 y.o., female Today's Date: 01/13/2024  END OF SESSION:  PT End of Session - 01/13/24 1536     Visit Number 3    Date for Recertification  02/26/24    Authorization Type [employer] no auth required    PT Start Time 1447    PT Stop Time 1528    PT Time Calculation (min) 41 min    Activity Tolerance Patient tolerated treatment well    Behavior During Therapy [facility] for tasks assessed/performed            Past Medical History:  Diagnosis Date   Constipation    chronic, improved with healthy diet   Endocervical polyp    GERD (gastroesophageal reflux disease)    Hemorrhoids    resolved   Hyperlipidemia    Hypertension    Osteoarthritis, knee 06/27/2015   -saw [facility]    Osteopenia 06/27/2015   -reports on medication remotely with gyn    Past Surgical History:  Procedure Laterality Date   APPENDECTOMY     BACK SURGERY  2014   bulging disc lumbar spine   BREAST CYST ASPIRATION Right    CESAREAN SECTION     2   CHOLECYSTECTOMY N/A 11/16/2020   Procedure: LAPAROSCOPIC CHOLECYSTECTOMY;  Surgeon: Curvin Deward MOULD, MD;  Location: [facility] OR;  Service: General;  Laterality: N/A;   Patient Active Problem List   Diagnosis Date Noted   Trochanteric bursitis of both hips 07/05/2022   Postlaminectomy syndrome, lumbar region 07/05/2022   Sacroiliac dysfunction 07/05/2022   Muscle pain 10/19/2021   Prediabetes 10/19/2021   Hypertension 04/27/2020   Osteoarthritis, knee 06/27/2015   BMI 28.0-28.9,adult 06/27/2015   Constipation 06/27/2015   Osteopenia 06/27/2015    PCP: Ozell Heron HERO, MD   REFERRING PROVIDER: Carilyn Prentice BRAVO, MD   REFERRING DIAG:  M96.1 (ICD-10-CM) - Postlaminectomy syndrome, lumbar region  M22.2X1,M22.2X2 (ICD-10-CM) - Patellofemoral arthralgia of both knees    Rationale for Evaluation and Treatment:  Rehabilitation  THERAPY DIAG:  Radiculopathy, lumbar region  Pain in right lower leg  Muscle weakness (generalized)  ONSET DATE: 2 years ago  SUBJECTIVE:                                                                                                                                                                                           SUBJECTIVE STATEMENT: Used massage gun at home and it helped temporarily. I have widespread muscle pain due to statin use and I have stopped taking this medicine now.  Eval:  In the last couple of years my back started hurting again. Has injections which helps for a while, but then it moves to my hips, now in my knees. My knees have been bad since 2011. Also gets injections for hips in October. Years since knees have been injected. Back is on/off in back. It's only the R knee that's bothering me, not the left. Uses step to gait on stairs. Hard to put on pair of socks. Stooping very hard.  PERTINENT HISTORY:  Lumbar laminectomy 2014, OA knee, osteopenia  PAIN: 01/13/2024  Are you having pain? Yes: NPRS scale: 5/10 in R Pain location: R post knee and down leg  Pain description: tingling achy Aggravating factors: walking and bending Relieving factors: nothing, Tylenol   Are you having pain? Yes: NPRS scale: 0/10 up to 10/10 Pain location: B low back Pain description: achy Aggravating factors: standing, bending Relieving factors: sitting  PRECAUTIONS: None  RED FLAGS: None   WEIGHT BEARING RESTRICTIONS: No  FALLS:  Has patient fallen in last 6 months? No  LIVING ENVIRONMENT: Lives with: grandaughter (19) Lives in: House/apartment Stairs: Yes: Internal: 13 steps; can reach both and External: 4 steps; can reach both Has following equipment at home: None  OCCUPATION: retired  PLOF: Independent with basic ADLs and needs help carrying things on stairs  PATIENT GOALS: Get my right leg working  NEXT MD VISIT: Jan 15, 2024  OBJECTIVE:   Note: Objective measures were completed at Evaluation unless otherwise noted.  DIAGNOSTIC FINDINGS:  XR IMPRESSION: 1. No fracture or dislocation of the bilateral knees. 2. Severe arthrosis of the bilateral patellofemoral compartments with mild arthrosis of the femorotibial compartments. 3. Small bilateral knee joint effusions.  MRI 2024 Postsurgical changes from L5-S1 posterior spinal fusion. No evidence of hardware complications. There is mild junctional level degenerative change at L4-L5 where there is mild spinal canal and bilateral neural foraminal narrowing. There is also moderate bilateral facet degenerative change at this level with fluid in the facet joints. No evidence of high-grade spinal canal or neural foraminal stenosis.  PATIENT SURVEYS:  Modified Oswestry:  MODIFIED OSWESTRY DISABILITY SCALE  Date: 01/01/24 Score  Pain intensity 3 =  Pain medication provides me with moderate relief from pain.  2. Personal care (washing, dressing, etc.) 2 =  It is painful to take care of myself, and I am slow and careful.  3. Lifting 2 = Pain prevents me from lifting heavy weights off the floor,but I can manage if the weights are conveniently positioned (e.g. on a table)  4. Walking 3 =  Pain prevents me from walking more than  mile.  5. Sitting 1 =  I can only sit in my favorite chair as long as I like.  6. Standing 4 =  Pain prevents me from standing more than 10 minutes.  7. Sleeping 2 =  Even when I take pain medication, I sleep less than 6 hours  8. Social Life 2 = Pain prevents me from participating in more energetic activities (eg. sports, dancing).  9. Traveling 4 = My pain restricts my travel to short necessary journeys under 1/2 hour.  10. Employment/ Homemaking 2 = I can perform most of my homemaking/job duties, but pain prevents me from performing more physically stressful activities (eg, lifting, vacuuming).  Total 25/50   Interpretation of scores: Score Category  Description  0-20% Minimal Disability The patient can cope with most living activities. Usually no treatment is indicated apart from advice on lifting, sitting  and exercise  21-40% Moderate Disability The patient experiences more pain and difficulty with sitting, lifting and standing. Travel and social life are more difficult and they may be disabled from work. Personal care, sexual activity and sleeping are not grossly affected, and the patient can usually be managed by conservative means  41-60% Severe Disability Pain remains the main problem in this group, but activities of daily living are affected. These patients require a detailed investigation  61-80% Crippled Back pain impinges on all aspects of the patient's life. Positive intervention is required  81-100% Bed-bound These patients are either bed-bound or exaggerating their symptoms  Bluford FORBES Zoe DELENA Karon DELENA, et al. Surgery versus conservative management of stable thoracolumbar fracture: the PRESTO feasibility RCT. [place] ([place]): [employer]; 2021 Nov. [facility] Technology Assessment, No. 25.62.) Appendix 3, Oswestry Disability Index category descriptors. Available from: Findjewelers.cz  Minimally Clinically Important Difference (MCID) = 12.8%  COGNITION: Overall cognitive status: Within functional limits for tasks assessed     SENSATION: Light touch: Impaired  R decreased compared to Left  MUSCLE LENGTH: Tightness in  B HS, piriformis,  POSTURE: rounded shoulders and weight shift left  PALPATION: Palpation: TTP at R peroneals.Spinal Mobility: NT Patellar Mobility: WNL     LUMBAR ROM: Full but pain at end range extension and left rotation  LOWER EXTREMITY ROM:   WFL for tasks assessed  LOWER EXTREMITY MMT:  *pain;  LLE 5/5 except where noted  MMT Right eval Left eval  Hip flexion 4- 4-  Hip extension    Hip abduction    Hip adduction    Hip internal rotation 4   Hip  external rotation 4+   Knee flexion 3+* 5  Knee extension 4- 5  Ankle dorsiflexion 5 5  Ankle plantarflexion    Ankle inversion    Ankle eversion 4+    (Blank rows = not tested)  HIP TESTS: Could not tolerated hip scour test, SKTC or FABER on R  FUNCTIONAL TESTS:  5 times sit to stand: 32.90 sec must use hands on knees  GAIT: Antalgic with decreased stance time R  TREATMENT DATE:                                                                                                                               01/13/24:  Nustep L5 x 6 min- PT present to discuss progress  Seated HS stretch 2x20 sec B Seated fig 4 L;  (R side modified pigeon) 3 x 20 sec B LAQ 3# 2x10 Seated hip ER 3# 2x10  Seated hip up and overs (low cone on floor) 2 x 10 Bil  Seated HS curl green 2x10 L  Seated clam green 2x10 Supine SLR x 10 B then 1x5 R   01/07/24 Nustep L5 x 5 Seated HS stretch 2x20 sec B Seated fig 4 L;  (R side modified pigeon) 3 x 20 sec B Hip IR x 10 LAQ 3#  2x10 Seated hip up and overs (scissors on floor) 2 x 10 B  - go higher next visit Seated HS curl green 2x10 L  Seated clam green 2x10 Sit to stand with green band x 5 painful Supine SLR x 10 B then 1x5 R   01/01/24  See pt ed and HEP    PATIENT EDUCATION:  Education details: PT eval findings, anticipated POC, initial HEP, and role of TPDN   Person educated: Patient Education method: Explanation, Demonstration, Tactile cues, Verbal cues, and Handouts Education comprehension: verbalized understanding and returned demonstration  HOME EXERCISE PROGRAM: Access Code: [email or web address] URL: [email or web address] Date: 01/01/2024 Prepared by: Mliss  Exercises - Supine Bridge  - 2 x daily - 7 x weekly - 1-3 sets - 10 reps - Seated Hip Internal Rotation AROM  - 2 x daily - 7 x weekly - 2 sets - 10 reps - Seated Long Arc Quad  - 2 x daily - 7 x weekly - 2 sets - 10 reps - 5 sec hold  ASSESSMENT:  CLINICAL IMPRESSION: Pt  has widespread pain associated with statin use and she has stopped taking them now.  She tolerated all exercises well today and coordinated core activation with hip motion well today.  She is limited with Rt hip ER and modified stretch to be performed in supine with strap.  Pt is using massage gun at home for tissue mobility.  PT monitored throughout session for pain and form.  Patient will benefit from skilled PT to address the below impairments and improve overall function.    OBJECTIVE IMPAIRMENTS: Abnormal gait, decreased activity tolerance, decreased knowledge of condition, decreased ROM, decreased strength, increased muscle spasms, impaired flexibility, postural dysfunction, and pain.   ACTIVITY LIMITATIONS: bending, standing, squatting, stairs, transfers, bed mobility, dressing, and locomotion level  PARTICIPATION LIMITATIONS: meal prep, cleaning, laundry, shopping, community activity, yard work, and church  PERSONAL FACTORS: Age, Fitness, Time since onset of injury/illness/exacerbation, and 1-2 comorbidities: OA anc chronic LBP are also affecting patient's functional outcome.   REHAB POTENTIAL: Good  CLINICAL DECISION MAKING: Evolving/moderate complexity  EVALUATION COMPLEXITY: Moderate   GOALS: Goals reviewed with patient? Yes  SHORT TERM GOALS: Target date: 01/29/2024   Patient will be independent with initial HEP.  Baseline:  Goal status: INITIAL  2.  Patient will report centralization of radicular symptoms.  Baseline:  Goal status: INITIAL  3.  Decreased pain by 30% with standing and walking Baseline:  Goal status: INITIAL   LONG TERM GOALS: Target date: 02/26/2024   Patient will be independent with advanced/ongoing HEP to improve outcomes and carryover.  Baseline:  Goal status: INITIAL  2.  Patient will report 75% improvement in pain with ADLs to improve QOL.  Baseline:  Goal status: INITIAL  3. Improved 5XSTS by 2-3 seconds showing functional improvement in  strength.  Baseline:  Goal status: INITIAL  4.  Patient will demonstrate improved strength to 4+/5 to normalize body mechanics. Baseline:  Goal status: INITIAL  5.  Patient will score <= 19 on the Modified Oswestry demonstrating improved functional ability.  Baseline: 25 Goal status: INITIAL  6.  Patient able to walk with a normal gait pattern. Baseline:  Goal status: INITIAL  7.  Patient able to climb stairs with a reciprocal gait pattern.  Baseline: step to gait Goal status: INITIAL    PLAN:  PT FREQUENCY: 2x/week  PT DURATION: 8 weeks  PLANNED INTERVENTIONS: 97110-Therapeutic exercises, 97530- Therapeutic activity, [id number]- Neuromuscular re-education, 97535- Self Care, 02859- Manual therapy,  02883- Gait training, 02886- Aquatic Therapy, [phone]- Electrical stimulation (unattended), [id number]- Ultrasound, [id number]- Ionotophoresis 4mg /ml Dexamethasone , 20560 (1-2 muscles), 20561 (3+ muscles)- Dry Needling, Patient/Family education, Stair training, Taping, Joint mobilization, Spinal mobilization, Cryotherapy, and Moist heat.  PLAN FOR NEXT SESSION: Review and progress HEP, hip flexibility and strengthening, gait,  manual/Addaday/DN to R lumbar/gluteals/LE, spinal mobs Burnard Joy, PT 01/13/24 3:38 PM

## 2024-01-15 ENCOUNTER — Ambulatory Visit: Admitting: Pediatrics

## 2024-01-21 ENCOUNTER — Ambulatory Visit

## 2024-01-21 DIAGNOSIS — R2689 Other abnormalities of gait and mobility: Secondary | ICD-10-CM

## 2024-01-21 DIAGNOSIS — M5416 Radiculopathy, lumbar region: Secondary | ICD-10-CM

## 2024-01-21 DIAGNOSIS — M6281 Muscle weakness (generalized): Secondary | ICD-10-CM

## 2024-01-21 DIAGNOSIS — M5459 Other low back pain: Secondary | ICD-10-CM

## 2024-01-21 DIAGNOSIS — M79661 Pain in right lower leg: Secondary | ICD-10-CM

## 2024-01-21 NOTE — Therapy (Signed)
 OUTPATIENT PHYSICAL THERAPY THORACOLUMBAR TREATMENT   Patient Name: Colleen Coffey MRN: [id number] DOB:1950-05-31, 73 y.o., female Today's Date: 01/21/2024  END OF SESSION:  PT End of Session - 01/21/24 1233     Visit Number 4    Date for Recertification  02/26/24    Authorization Type [employer] no auth required    PT Start Time 1232    PT Stop Time 1314    PT Time Calculation (min) 42 min    Activity Tolerance Patient tolerated treatment well    Behavior During Therapy [facility] for tasks assessed/performed             Past Medical History:  Diagnosis Date   Constipation    chronic, improved with healthy diet   Endocervical polyp    GERD (gastroesophageal reflux disease)    Hemorrhoids    resolved   Hyperlipidemia    Hypertension    Osteoarthritis, knee 06/27/2015   -saw [facility]    Osteopenia 06/27/2015   -reports on medication remotely with gyn    Past Surgical History:  Procedure Laterality Date   APPENDECTOMY     BACK SURGERY  2014   bulging disc lumbar spine   BREAST CYST ASPIRATION Right    CESAREAN SECTION     2   CHOLECYSTECTOMY N/A 11/16/2020   Procedure: LAPAROSCOPIC CHOLECYSTECTOMY;  Surgeon: Curvin Deward MOULD, MD;  Location: [facility] OR;  Service: General;  Laterality: N/A;   Patient Active Problem List   Diagnosis Date Noted   Trochanteric bursitis of both hips 07/05/2022   Postlaminectomy syndrome, lumbar region 07/05/2022   Sacroiliac dysfunction 07/05/2022   Muscle pain 10/19/2021   Prediabetes 10/19/2021   Hypertension 04/27/2020   Osteoarthritis, knee 06/27/2015   BMI 28.0-28.9,adult 06/27/2015   Constipation 06/27/2015   Osteopenia 06/27/2015    PCP: Ozell Heron HERO, MD   REFERRING PROVIDER: Carilyn Prentice BRAVO, MD   REFERRING DIAG:  M96.1 (ICD-10-CM) - Postlaminectomy syndrome, lumbar region  M22.2X1,M22.2X2 (ICD-10-CM) - Patellofemoral arthralgia of both knees    Rationale for Evaluation and Treatment:  Rehabilitation  THERAPY DIAG:  Radiculopathy, lumbar region  Pain in right lower leg  Muscle weakness (generalized)  Other abnormalities of gait and mobility  Other low back pain  ONSET DATE: 2 years ago  SUBJECTIVE:                                                                                                                                                                                           SUBJECTIVE STATEMENT: My knees are bothering me, my low back also hurts.  Arms are not as sore as  they were when I was taking the cholesterol meds.    Eval:  In the last couple of years my back started hurting again. Has injections which helps for a while, but then it moves to my hips, now in my knees. My knees have been bad since 2011. Also gets injections for hips in October. Years since knees have been injected. Back is on/off in back. It's only the R knee that's bothering me, not the left. Uses step to gait on stairs. Hard to put on pair of socks. Stooping very hard.  PERTINENT HISTORY:  Lumbar laminectomy 2014, OA knee, osteopenia  PAIN: 01/21/2024  Are you having pain? Yes: NPRS scale: 0-10/10 in R Pain location: R post knee and down leg  Pain description: burning  Aggravating factors: walking and bending Relieving factors: nothing, Tylenol   Are you having pain? Yes: NPRS scale: 0/10 up to 10/10 Pain location: Bil low back Pain description: achy Aggravating factors: standing, bending Relieving factors: sitting  PRECAUTIONS: None  RED FLAGS: None   WEIGHT BEARING RESTRICTIONS: No  FALLS:  Has patient fallen in last 6 months? No  LIVING ENVIRONMENT: Lives with: grandaughter (19) Lives in: House/apartment Stairs: Yes: Internal: 13 steps; can reach both and External: 4 steps; can reach both Has following equipment at home: None  OCCUPATION: retired  PLOF: Independent with basic ADLs and needs help carrying things on stairs  PATIENT GOALS: Get my right leg  working  NEXT MD VISIT: Jan 15, 2024  OBJECTIVE:  Note: Objective measures were completed at Evaluation unless otherwise noted.  DIAGNOSTIC FINDINGS:  XR IMPRESSION: 1. No fracture or dislocation of the bilateral knees. 2. Severe arthrosis of the bilateral patellofemoral compartments with mild arthrosis of the femorotibial compartments. 3. Small bilateral knee joint effusions.  MRI 2024 Postsurgical changes from L5-S1 posterior spinal fusion. No evidence of hardware complications. There is mild junctional level degenerative change at L4-L5 where there is mild spinal canal and bilateral neural foraminal narrowing. There is also moderate bilateral facet degenerative change at this level with fluid in the facet joints. No evidence of high-grade spinal canal or neural foraminal stenosis.  PATIENT SURVEYS:  Modified Oswestry:  MODIFIED OSWESTRY DISABILITY SCALE  Date: 01/01/24 Score  Pain intensity 3 =  Pain medication provides me with moderate relief from pain.  2. Personal care (washing, dressing, etc.) 2 =  It is painful to take care of myself, and I am slow and careful.  3. Lifting 2 = Pain prevents me from lifting heavy weights off the floor,but I can manage if the weights are conveniently positioned (e.g. on a table)  4. Walking 3 =  Pain prevents me from walking more than  mile.  5. Sitting 1 =  I can only sit in my favorite chair as long as I like.  6. Standing 4 =  Pain prevents me from standing more than 10 minutes.  7. Sleeping 2 =  Even when I take pain medication, I sleep less than 6 hours  8. Social Life 2 = Pain prevents me from participating in more energetic activities (eg. sports, dancing).  9. Traveling 4 = My pain restricts my travel to short necessary journeys under 1/2 hour.  10. Employment/ Homemaking 2 = I can perform most of my homemaking/job duties, but pain prevents me from performing more physically stressful activities (eg, lifting, vacuuming).  Total 25/50    Interpretation of scores: Score Category Description  0-20% Minimal Disability The patient can cope with most living  activities. Usually no treatment is indicated apart from advice on lifting, sitting and exercise  21-40% Moderate Disability The patient experiences more pain and difficulty with sitting, lifting and standing. Travel and social life are more difficult and they may be disabled from work. Personal care, sexual activity and sleeping are not grossly affected, and the patient can usually be managed by conservative means  41-60% Severe Disability Pain remains the main problem in this group, but activities of daily living are affected. These patients require a detailed investigation  61-80% Crippled Back pain impinges on all aspects of the patient's life. Positive intervention is required  81-100% Bed-bound These patients are either bed-bound or exaggerating their symptoms  Bluford FORBES Zoe DELENA Karon DELENA, et al. Surgery versus conservative management of stable thoracolumbar fracture: the PRESTO feasibility RCT. [place] ([place]): [employer]; 2021 Nov. [facility] Technology Assessment, No. 25.62.) Appendix 3, Oswestry Disability Index category descriptors. Available from: Findjewelers.cz  Minimally Clinically Important Difference (MCID) = 12.8%  COGNITION: Overall cognitive status: Within functional limits for tasks assessed     SENSATION: Light touch: Impaired  R decreased compared to Left  MUSCLE LENGTH: Tightness in  B HS, piriformis,  POSTURE: rounded shoulders and weight shift left  PALPATION: Palpation: TTP at R peroneals.Spinal Mobility: NT Patellar Mobility: WNL     LUMBAR ROM: Full but pain at end range extension and left rotation  LOWER EXTREMITY ROM:   WFL for tasks assessed  LOWER EXTREMITY MMT:  *pain;  LLE 5/5 except where noted  MMT Right eval Left eval  Hip flexion 4- 4-  Hip extension    Hip abduction    Hip  adduction    Hip internal rotation 4   Hip external rotation 4+   Knee flexion 3+* 5  Knee extension 4- 5  Ankle dorsiflexion 5 5  Ankle plantarflexion    Ankle inversion    Ankle eversion 4+    (Blank rows = not tested)  HIP TESTS: Could not tolerated hip scour test, SKTC or FABER on R  FUNCTIONAL TESTS:  5 times sit to stand: 32.90 sec must use hands on knees  GAIT: Antalgic with decreased stance time R  TREATMENT DATE:                                                                                                                               01/21/24:  Nustep L5 x 6 min- PT present to discuss progress  Seated HS stretch 2x20 sec B Supine low trunk rotation x5 bil  Supine quad sets: Rt 5 hold on towel roll x10  Seated hip up and overs (low cone on floor) 2 x 10 Bil  Seated HS curl green 2x10 L  Supine TA activation with hip abduction -red loop 2x10 Supine TA activation with hip adduction: ball squeezes 2x10 Supine SLR x 10 Lt  then x10 Rt    01/13/24:  Nustep L5 x 6 min- PT  present to discuss progress  Seated HS stretch 2x20 sec B Seated fig 4 L;  (R side modified pigeon) 3 x 20 sec B LAQ 3# 2x10 Seated hip ER 3# 2x10  Seated hip up and overs (low cone on floor) 2 x 10 Bil  Seated HS curl green 2x10 L  Seated clam green 2x10 Supine SLR x 10 B then 1x5 R   01/07/24 Nustep L5 x 5 Seated HS stretch 2x20 sec B Seated fig 4 L;  (R side modified pigeon) 3 x 20 sec B Hip IR x 10 LAQ 3# 2x10 Seated hip up and overs (scissors on floor) 2 x 10 B  - go higher next visit Seated HS curl green 2x10 L  Seated clam green 2x10 Sit to stand with green band x 5 painful Supine SLR x 10 B then 1x5 R   PATIENT EDUCATION:  Education details: PT eval findings, anticipated POC, initial HEP, and role of TPDN   Person educated: Patient Education method: Explanation, Demonstration, Tactile cues, Verbal cues, and Handouts Education comprehension: verbalized understanding and  returned demonstration  HOME EXERCISE PROGRAM: Access Code: [email or web address] URL: [email or web address] Date: 01/01/2024 Prepared by: Mliss  Exercises - Supine Bridge  - 2 x daily - 7 x weekly - 1-3 sets - 10 reps - Seated Hip Internal Rotation AROM  - 2 x daily - 7 x weekly - 2 sets - 10 reps - Seated Long Arc Quad  - 2 x daily - 7 x weekly - 2 sets - 10 reps - 5 sec hold  ASSESSMENT:  CLINICAL IMPRESSION: Pt has widespread pain  including low back and bil legs below the knee.  She tolerated all exercises well today and coordinated core activation with hip motion well today.  Pt with antalgic gait with reduced time on Rt LE.   PT monitored throughout session for pain and form.  Patient will benefit from skilled PT to address the below impairments and improve overall function.    OBJECTIVE IMPAIRMENTS: Abnormal gait, decreased activity tolerance, decreased knowledge of condition, decreased ROM, decreased strength, increased muscle spasms, impaired flexibility, postural dysfunction, and pain.   ACTIVITY LIMITATIONS: bending, standing, squatting, stairs, transfers, bed mobility, dressing, and locomotion level  PARTICIPATION LIMITATIONS: meal prep, cleaning, laundry, shopping, community activity, yard work, and church  PERSONAL FACTORS: Age, Fitness, Time since onset of injury/illness/exacerbation, and 1-2 comorbidities: OA anc chronic LBP are also affecting patient's functional outcome.   REHAB POTENTIAL: Good  CLINICAL DECISION MAKING: Evolving/moderate complexity  EVALUATION COMPLEXITY: Moderate   GOALS: Goals reviewed with patient? Yes  SHORT TERM GOALS: Target date: 01/29/2024   Patient will be independent with initial HEP.  Baseline:  Goal status: in progress   2.  Patient will report centralization of radicular symptoms.  Baseline:  Goal status: INITIAL  3.  Decreased pain by 30% with standing and walking Baseline: no change reported (01/21/24) Goal status:  INITIAL   LONG TERM GOALS: Target date: 02/26/2024   Patient will be independent with advanced/ongoing HEP to improve outcomes and carryover.  Baseline:  Goal status: INITIAL  2.  Patient will report 75% improvement in pain with ADLs to improve QOL.  Baseline:  Goal status: INITIAL  3. Improved 5XSTS by 2-3 seconds showing functional improvement in strength.  Baseline:  Goal status: INITIAL  4.  Patient will demonstrate improved strength to 4+/5 to normalize body mechanics. Baseline:  Goal status: INITIAL  5.  Patient will score <= 19 on the Modified Oswestry demonstrating  improved functional ability.  Baseline: 25 Goal status: INITIAL  6.  Patient able to walk with a normal gait pattern. Baseline:  Goal status: INITIAL  7.  Patient able to climb stairs with a reciprocal gait pattern.  Baseline: step to gait Goal status: INITIAL    PLAN:  PT FREQUENCY: 2x/week  PT DURATION: 8 weeks  PLANNED INTERVENTIONS: 97110-Therapeutic exercises, 97530- Therapeutic activity, 97112- Neuromuscular re-education, 97535- Self Care, [id number]- Manual therapy, [phone]- Gait training, [phone]- Aquatic Therapy, [phone]- Electrical stimulation (unattended), [id number]- Ultrasound, [id number]- Ionotophoresis 4mg /ml Dexamethasone , 79439 (1-2 muscles), [id number] (3+ muscles)- Dry Needling, Patient/Family education, Stair training, Taping, Joint mobilization, Spinal mobilization, Cryotherapy, and Moist heat.  PLAN FOR NEXT SESSION:, hip flexibility and strengthening, gait,  manual/Addaday/DN to R lumbar/gluteals/LE, spinal mobs Burnard Joy, PT 01/21/24 1:22 PM

## 2024-01-22 ENCOUNTER — Other Ambulatory Visit: Payer: Self-pay | Admitting: Family Medicine

## 2024-01-22 DIAGNOSIS — Z1231 Encounter for screening mammogram for malignant neoplasm of breast: Secondary | ICD-10-CM

## 2024-01-23 ENCOUNTER — Ambulatory Visit

## 2024-01-23 DIAGNOSIS — M6281 Muscle weakness (generalized): Secondary | ICD-10-CM

## 2024-01-23 DIAGNOSIS — M79661 Pain in right lower leg: Secondary | ICD-10-CM

## 2024-01-23 DIAGNOSIS — R262 Difficulty in walking, not elsewhere classified: Secondary | ICD-10-CM

## 2024-01-23 DIAGNOSIS — R2689 Other abnormalities of gait and mobility: Secondary | ICD-10-CM

## 2024-01-23 DIAGNOSIS — M5416 Radiculopathy, lumbar region: Secondary | ICD-10-CM | POA: Diagnosis not present

## 2024-01-23 DIAGNOSIS — M5459 Other low back pain: Secondary | ICD-10-CM

## 2024-01-23 NOTE — Therapy (Signed)
 OUTPATIENT PHYSICAL THERAPY THORACOLUMBAR TREATMENT   Patient Name: Colleen Coffey MRN: [id number] DOB:Apr 07, 1950, 73 y.o., female Today's Date: 01/23/2024  END OF SESSION:  PT End of Session - 01/23/24 1022     Visit Number 5    Date for Recertification  02/26/24    Authorization Type [employer] no auth required    PT Start Time 0932    PT Stop Time 1015    PT Time Calculation (min) 43 min    Activity Tolerance Patient tolerated treatment well    Behavior During Therapy [facility] for tasks assessed/performed              Past Medical History:  Diagnosis Date   Constipation    chronic, improved with healthy diet   Endocervical polyp    GERD (gastroesophageal reflux disease)    Hemorrhoids    resolved   Hyperlipidemia    Hypertension    Osteoarthritis, knee 06/27/2015   -saw [facility]    Osteopenia 06/27/2015   -reports on medication remotely with gyn    Past Surgical History:  Procedure Laterality Date   APPENDECTOMY     BACK SURGERY  2014   bulging disc lumbar spine   BREAST CYST ASPIRATION Right    CESAREAN SECTION     2   CHOLECYSTECTOMY N/A 11/16/2020   Procedure: LAPAROSCOPIC CHOLECYSTECTOMY;  Surgeon: Curvin Deward MOULD, MD;  Location: [facility] OR;  Service: General;  Laterality: N/A;   Patient Active Problem List   Diagnosis Date Noted   Trochanteric bursitis of both hips 07/05/2022   Postlaminectomy syndrome, lumbar region 07/05/2022   Sacroiliac dysfunction 07/05/2022   Muscle pain 10/19/2021   Prediabetes 10/19/2021   Hypertension 04/27/2020   Osteoarthritis, knee 06/27/2015   BMI 28.0-28.9,adult 06/27/2015   Constipation 06/27/2015   Osteopenia 06/27/2015    PCP: Ozell Heron HERO, MD   REFERRING PROVIDER: Ozell Heron HERO, MD   REFERRING DIAG:  M96.1 (ICD-10-CM) - Postlaminectomy syndrome, lumbar region  M22.2X1,M22.2X2 (ICD-10-CM) - Patellofemoral arthralgia of both knees    Rationale for Evaluation and Treatment:  Rehabilitation  THERAPY DIAG:  Radiculopathy, lumbar region  Pain in right lower leg  Muscle weakness (generalized)  Other abnormalities of gait and mobility  Other low back pain  Difficulty in walking, not elsewhere classified  ONSET DATE: 2 years ago  SUBJECTIVE:                                                                                                                                                                                           SUBJECTIVE STATEMENT: I did OK after last session.  Knees are  still hurting, Rt knee doesn't feel as stiff.   Eval:  In the last couple of years my back started hurting again. Has injections which helps for a while, but then it moves to my hips, now in my knees. My knees have been bad since 2011. Also gets injections for hips in October. Years since knees have been injected. Back is on/off in back. It's only the R knee that's bothering me, not the left. Uses step to gait on stairs. Hard to put on pair of socks. Stooping very hard.  PERTINENT HISTORY:  Lumbar laminectomy 2014, OA knee, osteopenia  PAIN: 01/21/2024  Are you having pain? Yes: NPRS scale: 0-10/10 in R Pain location: R post knee and down leg  Pain description: burning  Aggravating factors: walking and bending Relieving factors: nothing, Tylenol   Are you having pain? Yes: NPRS scale: 0/10 up to 10/10 Pain location: Bil low back Pain description: achy Aggravating factors: standing, bending Relieving factors: sitting  PRECAUTIONS: None  RED FLAGS: None   WEIGHT BEARING RESTRICTIONS: No  FALLS:  Has patient fallen in last 6 months? No  LIVING ENVIRONMENT: Lives with: grandaughter (19) Lives in: House/apartment Stairs: Yes: Internal: 13 steps; can reach both and External: 4 steps; can reach both Has following equipment at home: None  OCCUPATION: retired  PLOF: Independent with basic ADLs and needs help carrying things on stairs  PATIENT GOALS: Get my right  leg working  NEXT MD VISIT: 02/11/24  OBJECTIVE:  Note: Objective measures were completed at Evaluation unless otherwise noted.  DIAGNOSTIC FINDINGS:  XR IMPRESSION: 1. No fracture or dislocation of the bilateral knees. 2. Severe arthrosis of the bilateral patellofemoral compartments with mild arthrosis of the femorotibial compartments. 3. Small bilateral knee joint effusions.  MRI 2024 Postsurgical changes from L5-S1 posterior spinal fusion. No evidence of hardware complications. There is mild junctional level degenerative change at L4-L5 where there is mild spinal canal and bilateral neural foraminal narrowing. There is also moderate bilateral facet degenerative change at this level with fluid in the facet joints. No evidence of high-grade spinal canal or neural foraminal stenosis.  PATIENT SURVEYS:  Modified Oswestry:  MODIFIED OSWESTRY DISABILITY SCALE  Date: 01/01/24 Score  Pain intensity 3 =  Pain medication provides me with moderate relief from pain.  2. Personal care (washing, dressing, etc.) 2 =  It is painful to take care of myself, and I am slow and careful.  3. Lifting 2 = Pain prevents me from lifting heavy weights off the floor,but I can manage if the weights are conveniently positioned (e.g. on a table)  4. Walking 3 =  Pain prevents me from walking more than  mile.  5. Sitting 1 =  I can only sit in my favorite chair as long as I like.  6. Standing 4 =  Pain prevents me from standing more than 10 minutes.  7. Sleeping 2 =  Even when I take pain medication, I sleep less than 6 hours  8. Social Life 2 = Pain prevents me from participating in more energetic activities (eg. sports, dancing).  9. Traveling 4 = My pain restricts my travel to short necessary journeys under 1/2 hour.  10. Employment/ Homemaking 2 = I can perform most of my homemaking/job duties, but pain prevents me from performing more physically stressful activities (eg, lifting, vacuuming).  Total  25/50   Interpretation of scores: Score Category Description  0-20% Minimal Disability The patient can cope with most living activities. Usually no treatment  is indicated apart from advice on lifting, sitting and exercise  21-40% Moderate Disability The patient experiences more pain and difficulty with sitting, lifting and standing. Travel and social life are more difficult and they may be disabled from work. Personal care, sexual activity and sleeping are not grossly affected, and the patient can usually be managed by conservative means  41-60% Severe Disability Pain remains the main problem in this group, but activities of daily living are affected. These patients require a detailed investigation  61-80% Crippled Back pain impinges on all aspects of the patients life. Positive intervention is required  81-100% Bed-bound These patients are either bed-bound or exaggerating their symptoms  Bluford FORBES Zoe DELENA Karon DELENA, et al. Surgery versus conservative management of stable thoracolumbar fracture: the PRESTO feasibility RCT. [place] ([place]): [employer]; 2021 Nov. [facility] Technology Assessment, No. 25.62.) Appendix 3, Oswestry Disability Index category descriptors. Available from: Findjewelers.cz  Minimally Clinically Important Difference (MCID) = 12.8%  COGNITION: Overall cognitive status: Within functional limits for tasks assessed     SENSATION: Light touch: Impaired  R decreased compared to Left  MUSCLE LENGTH: Tightness in  B HS, piriformis,  POSTURE: rounded shoulders and weight shift left  PALPATION: Palpation: TTP at R peroneals.Spinal Mobility: NT Patellar Mobility: WNL     LUMBAR ROM: Full but pain at end range extension and left rotation  LOWER EXTREMITY ROM:   WFL for tasks assessed  LOWER EXTREMITY MMT:  *pain;  LLE 5/5 except where noted  MMT Right eval Left eval  Hip flexion 4- 4-  Hip extension    Hip abduction     Hip adduction    Hip internal rotation 4   Hip external rotation 4+   Knee flexion 3+* 5  Knee extension 4- 5  Ankle dorsiflexion 5 5  Ankle plantarflexion    Ankle inversion    Ankle eversion 4+    (Blank rows = not tested)  HIP TESTS: Could not tolerated hip scour test, SKTC or FABER on R  FUNCTIONAL TESTS:  5 times sit to stand: 32.90 sec must use hands on knees 01/23/24: 5x sit to stand 25.94 seconds with use of hands   GAIT: Antalgic with decreased stance time R  TREATMENT DATE:                                                                                                                                01/23/24:  Nustep L5 x 6 min- PT present to discuss progress  Seated HS stretch 2x20 sec Bil Supine low trunk rotation x5 bil  Supine quad sets: Rt 5 hold on towel roll x10  Seated hip up and overs (low cone on floor) 2 x 10 Bil -holding 4# weight on top of thigh Seated HS curl yellow loop 2x10 bil Seated TA activation with press into ball 5 hold x10- verbal cues to reduce scapular elevation Supine TA activation with press into ball:  opposite arm/leg 5 hold x10  Supine TA activation with hip adduction: ball squeezes 2x10 Prone hip extension 2x10 - increased challenge on the Rt Manual: PA mobs L3-5 grade 2-3, no palpable tenderness at gluteals or lumbar paraspinals    01/21/24:  Nustep L5 x 6 min- PT present to discuss progress  Seated HS stretch 2x20 sec B Supine low trunk rotation x5 bil  Supine quad sets: Rt 5 hold on towel roll x10  Seated hip up and overs (low cone on floor) 2 x 10 Bil  Seated HS curl green 2x10 L  Supine TA activation with hip abduction -red loop 2x10 Supine TA activation with hip adduction: ball squeezes 2x10 Supine SLR x 10 Lt  then x10 Rt    01/13/24:  Nustep L5 x 6 min- PT present to discuss progress  Seated HS stretch 2x20 sec B Seated fig 4 L;  (R side modified pigeon) 3 x 20 sec B LAQ 3# 2x10 Seated hip ER 3# 2x10  Seated hip  up and overs (low cone on floor) 2 x 10 Bil  Seated HS curl green 2x10 L  Seated clam green 2x10 Supine SLR x 10 B then 1x5 R   PATIENT EDUCATION:  Education details: PT eval findings, anticipated POC, initial HEP, and role of TPDN   Person educated: Patient Education method: Explanation, Demonstration, Tactile cues, Verbal cues, and Handouts Education comprehension: verbalized understanding and returned demonstration  HOME EXERCISE PROGRAM: Access Code: [email or web address] URL: [email or web address] Date: 01/01/2024 Prepared by: Mliss  Exercises - Supine Bridge  - 2 x daily - 7 x weekly - 1-3 sets - 10 reps - Seated Hip Internal Rotation AROM  - 2 x daily - 7 x weekly - 2 sets - 10 reps - Seated Long Arc Quad  - 2 x daily - 7 x weekly - 2 sets - 10 reps - 5 sec hold  ASSESSMENT:  CLINICAL IMPRESSION: Pt has widespread pain  including low back and bil legs below the knee. She requires UE assistance with sit to stand and 5x sit to stand improved to 25.94 seconds. She did well with advancement of exercises today.   Pt with antalgic gait with reduced time on Rt LE.   PT monitored throughout session for pain and form. She required verbal and tactile cues to reduce scapular elevation.  No palpable tenderness in lumbar spine or gluteals today.  Mild tenderness with PA mobs L3-5 with reduced mobility.  Patient will benefit from skilled PT to address the below impairments and improve overall function.    OBJECTIVE IMPAIRMENTS: Abnormal gait, decreased activity tolerance, decreased knowledge of condition, decreased ROM, decreased strength, increased muscle spasms, impaired flexibility, postural dysfunction, and pain.   ACTIVITY LIMITATIONS: bending, standing, squatting, stairs, transfers, bed mobility, dressing, and locomotion level  PARTICIPATION LIMITATIONS: meal prep, cleaning, laundry, shopping, community activity, yard work, and church  PERSONAL FACTORS: Age, Fitness, Time since  onset of injury/illness/exacerbation, and 1-2 comorbidities: OA anc chronic LBP are also affecting patient's functional outcome.   REHAB POTENTIAL: Good  CLINICAL DECISION MAKING: Evolving/moderate complexity  EVALUATION COMPLEXITY: Moderate   GOALS: Goals reviewed with patient? Yes  SHORT TERM GOALS: Target date: 01/29/2024   Patient will be independent with initial HEP.  Baseline:  Goal status: MET  2.  Patient will report centralization of radicular symptoms.  Baseline:  Goal status: INITIAL  3.  Decreased pain by 30% with standing and walking Baseline: no change reported (01/21/24) Goal status: INITIAL   LONG  TERM GOALS: Target date: 02/26/2024   Patient will be independent with advanced/ongoing HEP to improve outcomes and carryover.  Baseline:  Goal status: INITIAL  2.  Patient will report 75% improvement in pain with ADLs to improve QOL.  Baseline:  Goal status: INITIAL  3. Improved 5XSTS by 2-3 seconds showing functional improvement in strength.  Baseline: 25.94 seconds (01/23/24) Goal status: met  4.  Patient will demonstrate improved strength to 4+/5 to normalize body mechanics. Baseline:  Goal status: INITIAL  5.  Patient will score <= 19 on the Modified Oswestry demonstrating improved functional ability.  Baseline: 25 Goal status: INITIAL  6.  Patient able to walk with a normal gait pattern. Baseline: antalgic with reduced time in stance (01/23/24) Goal status: in progress   7.  Patient able to climb stairs with a reciprocal gait pattern.  Baseline: step to gait Goal status: INITIAL    PLAN:  PT FREQUENCY: 2x/week  PT DURATION: 8 weeks  PLANNED INTERVENTIONS: 97110-Therapeutic exercises, 97530- Therapeutic activity, 97112- Neuromuscular re-education, 97535- Self Care, 02859- Manual therapy, [phone]- Gait training, [phone]- Aquatic Therapy, [phone]- Electrical stimulation (unattended), [id number]- Ultrasound, [id number]- Ionotophoresis 4mg /ml Dexamethasone ,  79439 (1-2 muscles), [id number] (3+ muscles)- Dry Needling, Patient/Family education, Stair training, Taping, Joint mobilization, Spinal mobilization, Cryotherapy, and Moist heat.  PLAN FOR NEXT SESSION:, hip flexibility and strengthening, gait,  manual/Addaday/DN to R lumbar/gluteals/LE, spinal mobs  Burnard Joy, PT 01/23/2024 10:25 AM

## 2024-01-25 ENCOUNTER — Other Ambulatory Visit: Payer: Self-pay | Admitting: Family Medicine

## 2024-01-25 DIAGNOSIS — E782 Mixed hyperlipidemia: Secondary | ICD-10-CM

## 2024-01-28 ENCOUNTER — Ambulatory Visit: Admitting: Physical Therapy

## 2024-01-28 ENCOUNTER — Encounter: Payer: Self-pay | Admitting: Physical Therapy

## 2024-01-28 DIAGNOSIS — M5416 Radiculopathy, lumbar region: Secondary | ICD-10-CM | POA: Diagnosis not present

## 2024-01-28 DIAGNOSIS — R2689 Other abnormalities of gait and mobility: Secondary | ICD-10-CM

## 2024-01-28 DIAGNOSIS — M79661 Pain in right lower leg: Secondary | ICD-10-CM

## 2024-01-28 DIAGNOSIS — M6281 Muscle weakness (generalized): Secondary | ICD-10-CM

## 2024-01-28 NOTE — Therapy (Signed)
 OUTPATIENT PHYSICAL THERAPY THORACOLUMBAR TREATMENT   Patient Name: Colleen Coffey MRN: [id number] DOB:01-Jan-1951, 73 y.o., female Today's Date: 01/28/2024  END OF SESSION:  PT End of Session - 01/28/24 0846     Visit Number 6    Date for Recertification  02/26/24    Authorization Type [employer] no auth required    PT Start Time 0846    PT Stop Time 0930    PT Time Calculation (min) 44 min    Activity Tolerance Patient tolerated treatment well    Behavior During Therapy [facility] for tasks assessed/performed              Past Medical History:  Diagnosis Date   Constipation    chronic, improved with healthy diet   Endocervical polyp    GERD (gastroesophageal reflux disease)    Hemorrhoids    resolved   Hyperlipidemia    Hypertension    Osteoarthritis, knee 06/27/2015   -saw [facility]    Osteopenia 06/27/2015   -reports on medication remotely with gyn    Past Surgical History:  Procedure Laterality Date   APPENDECTOMY     BACK SURGERY  2014   bulging disc lumbar spine   BREAST CYST ASPIRATION Right    CESAREAN SECTION     2   CHOLECYSTECTOMY N/A 11/16/2020   Procedure: LAPAROSCOPIC CHOLECYSTECTOMY;  Surgeon: Curvin Deward MOULD, MD;  Location: [facility] OR;  Service: General;  Laterality: N/A;   Patient Active Problem List   Diagnosis Date Noted   Trochanteric bursitis of both hips 07/05/2022   Postlaminectomy syndrome, lumbar region 07/05/2022   Sacroiliac dysfunction 07/05/2022   Muscle pain 10/19/2021   Prediabetes 10/19/2021   Hypertension 04/27/2020   Osteoarthritis, knee 06/27/2015   BMI 28.0-28.9,adult 06/27/2015   Constipation 06/27/2015   Osteopenia 06/27/2015    PCP: Ozell Heron HERO, MD   REFERRING PROVIDER: Ozell Heron HERO, MD   REFERRING DIAG:  M96.1 (ICD-10-CM) - Postlaminectomy syndrome, lumbar region  M22.2X1,M22.2X2 (ICD-10-CM) - Patellofemoral arthralgia of both knees    Rationale for Evaluation and Treatment:  Rehabilitation  THERAPY DIAG:  Radiculopathy, lumbar region  Pain in right lower leg  Muscle weakness (generalized)  Other abnormalities of gait and mobility  ONSET DATE: 2 years ago  SUBJECTIVE:                                                                                                                                                                                           SUBJECTIVE STATEMENT: This right leg is still really bad. 10/10 with walking. The left side is just intermittent now. I felt good after  leaving last week and it was good the rest of that day and the next day.   Eval:  In the last couple of years my back started hurting again. Has injections which helps for a while, but then it moves to my hips, now in my knees. My knees have been bad since 2011. Also gets injections for hips in October. Years since knees have been injected. Back is on/off in back. It's only the R knee that's bothering me, not the left. Uses step to gait on stairs. Hard to put on pair of socks. Stooping very hard.  PERTINENT HISTORY:  Lumbar laminectomy 2014, OA knee, osteopenia  PAIN: 01/28/2024   Are you having pain? Yes: NPRS scale: 10/10 in R with walking Pain location: R post knee and down leg  Pain description: burning  Aggravating factors: walking and bending Relieving factors: nothing, Tylenol   Are you having pain? Yes: NPRS scale: 0/10 up to 10/10 Pain location: Bil low back Pain description: achy Aggravating factors: standing, bending Relieving factors: sitting  PRECAUTIONS: None  RED FLAGS: None   WEIGHT BEARING RESTRICTIONS: No  FALLS:  Has patient fallen in last 6 months? No  LIVING ENVIRONMENT: Lives with: grandaughter (19) Lives in: House/apartment Stairs: Yes: Internal: 13 steps; can reach both and External: 4 steps; can reach both Has following equipment at home: None  OCCUPATION: retired  PLOF: Independent with basic ADLs and needs help carrying things  on stairs  PATIENT GOALS: Get my right leg working  NEXT MD VISIT: 02/11/24  OBJECTIVE:  Note: Objective measures were completed at Evaluation unless otherwise noted.  DIAGNOSTIC FINDINGS:  XR IMPRESSION: 1. No fracture or dislocation of the bilateral knees. 2. Severe arthrosis of the bilateral patellofemoral compartments with mild arthrosis of the femorotibial compartments. 3. Small bilateral knee joint effusions.  MRI 2024 Postsurgical changes from L5-S1 posterior spinal fusion. No evidence of hardware complications. There is mild junctional level degenerative change at L4-L5 where there is mild spinal canal and bilateral neural foraminal narrowing. There is also moderate bilateral facet degenerative change at this level with fluid in the facet joints. No evidence of high-grade spinal canal or neural foraminal stenosis.  PATIENT SURVEYS:  Modified Oswestry:  MODIFIED OSWESTRY DISABILITY SCALE  Date: 01/01/24 Score  Pain intensity 3 =  Pain medication provides me with moderate relief from pain.  2. Personal care (washing, dressing, etc.) 2 =  It is painful to take care of myself, and I am slow and careful.  3. Lifting 2 = Pain prevents me from lifting heavy weights off the floor,but I can manage if the weights are conveniently positioned (e.g. on a table)  4. Walking 3 =  Pain prevents me from walking more than  mile.  5. Sitting 1 =  I can only sit in my favorite chair as long as I like.  6. Standing 4 =  Pain prevents me from standing more than 10 minutes.  7. Sleeping 2 =  Even when I take pain medication, I sleep less than 6 hours  8. Social Life 2 = Pain prevents me from participating in more energetic activities (eg. sports, dancing).  9. Traveling 4 = My pain restricts my travel to short necessary journeys under 1/2 hour.  10. Employment/ Homemaking 2 = I can perform most of my homemaking/job duties, but pain prevents me from performing more physically stressful  activities (eg, lifting, vacuuming).  Total 25/50   Interpretation of scores: Score Category Description  0-20% Minimal Disability  The patient can cope with most living activities. Usually no treatment is indicated apart from advice on lifting, sitting and exercise  21-40% Moderate Disability The patient experiences more pain and difficulty with sitting, lifting and standing. Travel and social life are more difficult and they may be disabled from work. Personal care, sexual activity and sleeping are not grossly affected, and the patient can usually be managed by conservative means  41-60% Severe Disability Pain remains the main problem in this group, but activities of daily living are affected. These patients require a detailed investigation  61-80% Crippled Back pain impinges on all aspects of the patients life. Positive intervention is required  81-100% Bed-bound These patients are either bed-bound or exaggerating their symptoms  Bluford FORBES Zoe DELENA Karon DELENA, et al. Surgery versus conservative management of stable thoracolumbar fracture: the PRESTO feasibility RCT. [place] ([place]): [employer]; 2021 Nov. [facility] Technology Assessment, No. 25.62.) Appendix 3, Oswestry Disability Index category descriptors. Available from: Findjewelers.cz  Minimally Clinically Important Difference (MCID) = 12.8%  COGNITION: Overall cognitive status: Within functional limits for tasks assessed     SENSATION: Light touch: Impaired  R decreased compared to Left  MUSCLE LENGTH: Tightness in  B HS, piriformis,  POSTURE: rounded shoulders and weight shift left  PALPATION: Palpation: TTP at R peroneals.Spinal Mobility: NT Patellar Mobility: WNL     LUMBAR ROM: Full but pain at end range extension and left rotation  LOWER EXTREMITY ROM:   WFL for tasks assessed  LOWER EXTREMITY MMT:  *pain;  LLE 5/5 except where noted  MMT Right eval Left eval  Hip flexion  4- 4-  Hip extension    Hip abduction    Hip adduction    Hip internal rotation 4   Hip external rotation 4+   Knee flexion 3+* 5  Knee extension 4- 5  Ankle dorsiflexion 5 5  Ankle plantarflexion    Ankle inversion    Ankle eversion 4+    (Blank rows = not tested)  HIP TESTS: Could not tolerated hip scour test, SKTC or FABER on R  FUNCTIONAL TESTS:  5 times sit to stand: 32.90 sec must use hands on knees 01/23/24: 5x sit to stand 25.94 seconds with use of hands   GAIT: Antalgic with decreased stance time R  TREATMENT DATE:                                                                                                                                01/28/24:  Nustep L5 x 7 min- PT present to discuss progress  Worked on sit to stand with straight back - scooting to edge of mat table - challenging due to knees Prone lying abolishes her leg pain from 5/10 to 0/10 Prone on elbows is good as well - discussed doing prone or prone on elbows every 2 hours for 5 min if at home. Prone hip ext 2 x 10 B Bridge 2x10  Supine TA activation with press into ball: opposite arm/leg 5 hold x10  Supine TA activation with hip adduction: ball squeezes 2x10 Seated hip up and overs (low cone on foam due to pt height) 2 x 10 Bil -holding 4# weight on top of thigh Wall ext x 5 - relief to back    01/23/24:  Nustep L5 x 6 min- PT present to discuss progress  Seated HS stretch 2x20 sec Bil Supine low trunk rotation x5 bil  Supine quad sets: Rt 5 hold on towel roll x10  Seated hip up and overs (low cone on floor) 2 x 10 Bil -holding 4# weight on top of thigh Seated HS curl yellow loop 2x10 bil Seated TA activation with press into ball 5 hold x10- verbal cues to reduce scapular elevation Supine TA activation with press into ball: opposite arm/leg 5 hold x10  Supine TA activation with hip adduction: ball squeezes 2x10 Prone hip extension 2x10 - increased challenge on the Rt Manual: PA mobs L3-5  grade 2-3, no palpable tenderness at gluteals or lumbar paraspinals    01/21/24:  Nustep L5 x 6 min- PT present to discuss progress  Seated HS stretch 2x20 sec B Supine low trunk rotation x5 bil  Supine quad sets: Rt 5 hold on towel roll x10  Seated hip up and overs (low cone on floor) 2 x 10 Bil  Seated HS curl green 2x10 L  Supine TA activation with hip abduction -red loop 2x10 Supine TA activation with hip adduction: ball squeezes 2x10 Supine SLR x 10 Lt  then x10 Rt    01/13/24:  Nustep L5 x 6 min- PT present to discuss progress  Seated HS stretch 2x20 sec B Seated fig 4 L;  (R side modified pigeon) 3 x 20 sec B LAQ 3# 2x10 Seated hip ER 3# 2x10  Seated hip up and overs (low cone on floor) 2 x 10 Bil  Seated HS curl green 2x10 L  Seated clam green 2x10 Supine SLR x 10 B then 1x5 R   PATIENT EDUCATION:  Education details: PT eval findings, anticipated POC, initial HEP, and role of TPDN   Person educated: Patient Education method: Explanation, Demonstration, Tactile cues, Verbal cues, and Handouts Education comprehension: verbalized understanding and returned demonstration  HOME EXERCISE PROGRAM: Access Code: [email or web address] URL: [email or web address] Date: 01/28/2024 Prepared by: Mliss  Exercises - Supine Bridge  - 2 x daily - 7 x weekly - 1-3 sets - 10 reps - Seated Hip Internal Rotation AROM  - 2 x daily - 7 x weekly - 2 sets - 10 reps - Seated Long Arc Quad  - 2 x daily - 7 x weekly - 2 sets - 10 reps - 5 sec hold - Seated Table Piriformis Stretch (Mirrored)  - 2 x daily - 7 x weekly - 1 sets - 3 reps - 30-60 sec hold - Seated Piriformis Stretch with Trunk Bend  - 2 x daily - 7 x weekly - 1 sets - 3 reps - 30-60 sec  hold - Seated Hamstring Stretch  - 2 x daily - 7 x weekly - 1 sets - 3 reps - 30-60 sec hold - Seated March over Obstacle  - 1 x daily - 3 x weekly - 2 sets - 10 reps - Prone Hip Extension  - 1 x daily - 7 x weekly - 2 sets - 10 reps - Supine  Bridge  - 2 x daily - 7 x weekly - 1-3 sets -  10 reps - Abdominal Press into [place]  - 1 x daily - 7 x weekly - 2 sets - 10 reps - Standing Lumbar Extension at Wall - Forearms  - 4-5 x daily - 7 x weekly - 1 sets - 10 reps  ASSESSMENT:  CLINICAL IMPRESSION: Pt with ongoing 10/10 in R LE with walking. She is able to abolish pain with prone lying. We discussed increasing her frequency of lying prone throughout the day and before and after flexion activities. She also had relief in the back with lumbar ext at the wall. Encouraged patient to get up from sitting intermittently  and do lumbar ext. She reports relief with exercise generally. HEP updated.   OBJECTIVE IMPAIRMENTS: Abnormal gait, decreased activity tolerance, decreased knowledge of condition, decreased ROM, decreased strength, increased muscle spasms, impaired flexibility, postural dysfunction, and pain.   ACTIVITY LIMITATIONS: bending, standing, squatting, stairs, transfers, bed mobility, dressing, and locomotion level  PARTICIPATION LIMITATIONS: meal prep, cleaning, laundry, shopping, community activity, yard work, and church  PERSONAL FACTORS: Age, Fitness, Time since onset of injury/illness/exacerbation, and 1-2 comorbidities: OA anc chronic LBP are also affecting patient's functional outcome.   REHAB POTENTIAL: Good  CLINICAL DECISION MAKING: Evolving/moderate complexity  EVALUATION COMPLEXITY: Moderate   GOALS: Goals reviewed with patient? Yes  SHORT TERM GOALS: Target date: 01/29/2024   Patient will be independent with initial HEP.  Baseline:  Goal status: MET  2.  Patient will report centralization of radicular symptoms.  Baseline:  Goal status: INITIAL  3.  Decreased pain by 30% with standing and walking Baseline: no change reported (01/21/24) Goal status: INITIAL   LONG TERM GOALS: Target date: 02/26/2024   Patient will be independent with advanced/ongoing HEP to improve outcomes and carryover.  Baseline:   Goal status: INITIAL  2.  Patient will report 75% improvement in pain with ADLs to improve QOL.  Baseline:  Goal status: INITIAL  3. Improved 5XSTS by 2-3 seconds showing functional improvement in strength.  Baseline: 25.94 seconds (01/23/24) Goal status: met  4.  Patient will demonstrate improved strength to 4+/5 to normalize body mechanics. Baseline:  Goal status: INITIAL  5.  Patient will score <= 19 on the Modified Oswestry demonstrating improved functional ability.  Baseline: 25 Goal status: INITIAL  6.  Patient able to walk with a normal gait pattern. Baseline: antalgic with reduced time in stance (01/23/24) Goal status: in progress   7.  Patient able to climb stairs with a reciprocal gait pattern.  Baseline: step to gait Goal status: INITIAL    PLAN:  PT FREQUENCY: 2x/week  PT DURATION: 8 weeks  PLANNED INTERVENTIONS: 97110-Therapeutic exercises, 97530- Therapeutic activity, 97112- Neuromuscular re-education, 97535- Self Care, 02859- Manual therapy, [phone]- Gait training, [phone]- Aquatic Therapy, [phone]- Electrical stimulation (unattended), [id number]- Ultrasound, [id number]- Ionotophoresis 4mg /ml Dexamethasone , 79439 (1-2 muscles), [id number] (3+ muscles)- Dry Needling, Patient/Family education, Stair training, Taping, Joint mobilization, Spinal mobilization, Cryotherapy, and Moist heat.  PLAN FOR NEXT SESSION:assess response to prone lying and lumbar ext at wall, hip flexibility and strengthening, gait,  manual/Addaday/DN to R lumbar/gluteals/LE, spinal mobs  Burnard Joy, PT 01/28/2024 10:23 AM

## 2024-01-30 ENCOUNTER — Ambulatory Visit

## 2024-01-30 DIAGNOSIS — R2689 Other abnormalities of gait and mobility: Secondary | ICD-10-CM

## 2024-01-30 DIAGNOSIS — M6281 Muscle weakness (generalized): Secondary | ICD-10-CM

## 2024-01-30 DIAGNOSIS — M5416 Radiculopathy, lumbar region: Secondary | ICD-10-CM | POA: Diagnosis not present

## 2024-01-30 DIAGNOSIS — M79661 Pain in right lower leg: Secondary | ICD-10-CM

## 2024-01-30 NOTE — Therapy (Signed)
 OUTPATIENT PHYSICAL THERAPY THORACOLUMBAR TREATMENT   Patient Name: Taila Basinski MRN: [id number] DOB:20-Nov-1950, 73 y.o., female Today's Date: 01/30/2024  END OF SESSION:  PT End of Session - 01/30/24 1011     Visit Number 7    Date for Recertification  02/26/24    Authorization Type [employer] no auth required    PT Start Time 0932    PT Stop Time 1013    PT Time Calculation (min) 41 min    Activity Tolerance Patient tolerated treatment well    Behavior During Therapy [facility] for tasks assessed/performed               Past Medical History:  Diagnosis Date   Constipation    chronic, improved with healthy diet   Endocervical polyp    GERD (gastroesophageal reflux disease)    Hemorrhoids    resolved   Hyperlipidemia    Hypertension    Osteoarthritis, knee 06/27/2015   -saw [facility]    Osteopenia 06/27/2015   -reports on medication remotely with gyn    Past Surgical History:  Procedure Laterality Date   APPENDECTOMY     BACK SURGERY  2014   bulging disc lumbar spine   BREAST CYST ASPIRATION Right    CESAREAN SECTION     2   CHOLECYSTECTOMY N/A 11/16/2020   Procedure: LAPAROSCOPIC CHOLECYSTECTOMY;  Surgeon: Curvin Deward MOULD, MD;  Location: [facility] OR;  Service: General;  Laterality: N/A;   Patient Active Problem List   Diagnosis Date Noted   Trochanteric bursitis of both hips 07/05/2022   Postlaminectomy syndrome, lumbar region 07/05/2022   Sacroiliac dysfunction 07/05/2022   Muscle pain 10/19/2021   Prediabetes 10/19/2021   Hypertension 04/27/2020   Osteoarthritis, knee 06/27/2015   BMI 28.0-28.9,adult 06/27/2015   Constipation 06/27/2015   Osteopenia 06/27/2015    PCP: Ozell Heron HERO, MD   REFERRING PROVIDER: Carilyn Prentice BRAVO, MD   REFERRING DIAG:  M96.1 (ICD-10-CM) - Postlaminectomy syndrome, lumbar region  M22.2X1,M22.2X2 (ICD-10-CM) - Patellofemoral arthralgia of both knees    Rationale for Evaluation and Treatment:  Rehabilitation  THERAPY DIAG:  Radiculopathy, lumbar region  Pain in right lower leg  Muscle weakness (generalized)  Other abnormalities of gait and mobility  ONSET DATE: 2 years ago  SUBJECTIVE:                                                                                                                                                                                           SUBJECTIVE STATEMENT: I feel better today.   I was able to stand longer yesterday without limitation. 15-20% overall reduction in pain.  Eval:  In the last couple of years my back started hurting again. Has injections which helps for a while, but then it moves to my hips, now in my knees. My knees have been bad since 2011. Also gets injections for hips in October. Years since knees have been injected. Back is on/off in back. It's only the R knee that's bothering me, not the left. Uses step to gait on stairs. Hard to put on pair of socks. Stooping very hard.  PERTINENT HISTORY:  Lumbar laminectomy 2014, OA knee, osteopenia  PAIN: 01/30/2024   Are you having pain? Yes: NPRS scale: 6/10 in R with walking Pain location: R post knee and down leg  Pain description: burning  Aggravating factors: walking and bending Relieving factors: nothing, Tylenol   Are you having pain? Yes: NPRS scale: 0/10 up to 10/10 Pain location: Bil low back Pain description: achy Aggravating factors: standing, bending Relieving factors: sitting  PRECAUTIONS: None  RED FLAGS: None   WEIGHT BEARING RESTRICTIONS: No  FALLS:  Has patient fallen in last 6 months? No  LIVING ENVIRONMENT: Lives with: grandaughter (19) Lives in: House/apartment Stairs: Yes: Internal: 13 steps; can reach both and External: 4 steps; can reach both Has following equipment at home: None  OCCUPATION: retired  PLOF: Independent with basic ADLs and needs help carrying things on stairs  PATIENT GOALS: Get my right leg working  NEXT MD VISIT:  02/11/24  OBJECTIVE:  Note: Objective measures were completed at Evaluation unless otherwise noted.  DIAGNOSTIC FINDINGS:  XR IMPRESSION: 1. No fracture or dislocation of the bilateral knees. 2. Severe arthrosis of the bilateral patellofemoral compartments with mild arthrosis of the femorotibial compartments. 3. Small bilateral knee joint effusions.  MRI 2024 Postsurgical changes from L5-S1 posterior spinal fusion. No evidence of hardware complications. There is mild junctional level degenerative change at L4-L5 where there is mild spinal canal and bilateral neural foraminal narrowing. There is also moderate bilateral facet degenerative change at this level with fluid in the facet joints. No evidence of high-grade spinal canal or neural foraminal stenosis.  PATIENT SURVEYS:  Modified Oswestry:  MODIFIED OSWESTRY DISABILITY SCALE  Date: 01/01/24 Score  Pain intensity 3 =  Pain medication provides me with moderate relief from pain.  2. Personal care (washing, dressing, etc.) 2 =  It is painful to take care of myself, and I am slow and careful.  3. Lifting 2 = Pain prevents me from lifting heavy weights off the floor,but I can manage if the weights are conveniently positioned (e.g. on a table)  4. Walking 3 =  Pain prevents me from walking more than  mile.  5. Sitting 1 =  I can only sit in my favorite chair as long as I like.  6. Standing 4 =  Pain prevents me from standing more than 10 minutes.  7. Sleeping 2 =  Even when I take pain medication, I sleep less than 6 hours  8. Social Life 2 = Pain prevents me from participating in more energetic activities (eg. sports, dancing).  9. Traveling 4 = My pain restricts my travel to short necessary journeys under 1/2 hour.  10. Employment/ Homemaking 2 = I can perform most of my homemaking/job duties, but pain prevents me from performing more physically stressful activities (eg, lifting, vacuuming).  Total 25/50   Interpretation of  scores: Score Category Description  0-20% Minimal Disability The patient can cope with most living activities. Usually no treatment is indicated apart from advice on lifting,  sitting and exercise  21-40% Moderate Disability The patient experiences more pain and difficulty with sitting, lifting and standing. Travel and social life are more difficult and they may be disabled from work. Personal care, sexual activity and sleeping are not grossly affected, and the patient can usually be managed by conservative means  41-60% Severe Disability Pain remains the main problem in this group, but activities of daily living are affected. These patients require a detailed investigation  61-80% Crippled Back pain impinges on all aspects of the patients life. Positive intervention is required  81-100% Bed-bound These patients are either bed-bound or exaggerating their symptoms  Bluford FORBES Zoe DELENA Karon DELENA, et al. Surgery versus conservative management of stable thoracolumbar fracture: the PRESTO feasibility RCT. [place] ([place]): [employer]; 2021 Nov. Procedure [facility] Technology Assessment, No. 25.62.) Appendix 3, Oswestry Disability Index category descriptors. Available from: Findjewelers.cz  Minimally Clinically Important Difference (MCID) = 12.8%  COGNITION: Overall cognitive status: Within functional limits for tasks assessed     SENSATION: Light touch: Impaired  R decreased compared to Left  MUSCLE LENGTH: Tightness in  B HS, piriformis,  POSTURE: rounded shoulders and weight shift left  PALPATION: Palpation: TTP at R peroneals.Spinal Mobility: NT Patellar Mobility: WNL     LUMBAR ROM: Full but pain at end range extension and left rotation  LOWER EXTREMITY ROM:   WFL for tasks assessed  LOWER EXTREMITY MMT:  *pain;  LLE 5/5 except where noted  MMT Right eval Left eval  Hip flexion 4- 4-  Hip extension    Hip abduction    Hip adduction    Hip  internal rotation 4   Hip external rotation 4+   Knee flexion 3+* 5  Knee extension 4- 5  Ankle dorsiflexion 5 5  Ankle plantarflexion    Ankle inversion    Ankle eversion 4+    (Blank rows = not tested)  HIP TESTS: Could not tolerated hip scour test, SKTC or FABER on R  FUNCTIONAL TESTS:  5 times sit to stand: 32.90 sec must use hands on knees 01/23/24: 5x sit to stand 25.94 seconds with use of hands   GAIT: Antalgic with decreased stance time R  TREATMENT DATE:                                                                                                                                01/30/24:  Nustep L5 x 8 min- PT present to discuss progress  Prone lying abolishes her leg pain to 0/10  Prone on elbows x2 min Prone hip ext 2 x 10 B Bridge 2x10 Supine: TA activation with SLR 2x10 bil  Supine TA activation with press into ball: opposite arm/leg 5 hold x10  Supine TA activation with hip adduction: ball squeezes 2x10 Seated hip up and overs (low cone on foam due to pt height) 2 x 10 Bil -holding 4# weight on top of thigh Wall ext x 5 -  relief to back    01/28/24:  Nustep L5 x 7 min- PT present to discuss progress  Worked on sit to stand with straight back - scooting to edge of mat table - challenging due to knees Prone lying abolishes her leg pain from 5/10 to 0/10 Prone on elbows is good as well - discussed doing prone or prone on elbows every 2 hours for 5 min if at home. Prone hip ext 2 x 10 B Bridge 2x10 Supine TA activation with press into ball: opposite arm/leg 5 hold x10  Supine TA activation with hip adduction: ball squeezes 2x10 Seated hip up and overs (low cone on foam due to pt height) 2 x 10 Bil -holding 4# weight on top of thigh Wall ext x 5 - relief to back    01/23/24:  Nustep L5 x 6 min- PT present to discuss progress  Seated HS stretch 2x20 sec Bil Supine low trunk rotation x5 bil  Supine quad sets: Rt 5 hold on towel roll x10  Seated hip up  and overs (low cone on floor) 2 x 10 Bil -holding 4# weight on top of thigh Seated HS curl yellow loop 2x10 bil Seated TA activation with press into ball 5 hold x10- verbal cues to reduce scapular elevation Supine TA activation with press into ball: opposite arm/leg 5 hold x10  Supine TA activation with hip adduction: ball squeezes 2x10 Prone hip extension 2x10 - increased challenge on the Rt Manual: PA mobs L3-5 grade 2-3, no palpable tenderness at gluteals or lumbar paraspinals      PATIENT EDUCATION:  Education details: PT eval findings, anticipated POC, initial HEP, and role of TPDN   Person educated: Patient Education method: Explanation, Demonstration, Tactile cues, Verbal cues, and Handouts Education comprehension: verbalized understanding and returned demonstration  HOME EXERCISE PROGRAM: Access Code: [email or web address] URL: [email or web address] Date: 01/28/2024 Prepared by: Mliss  Exercises - Supine Bridge  - 2 x daily - 7 x weekly - 1-3 sets - 10 reps - Seated Hip Internal Rotation AROM  - 2 x daily - 7 x weekly - 2 sets - 10 reps - Seated Long Arc Quad  - 2 x daily - 7 x weekly - 2 sets - 10 reps - 5 sec hold - Seated Table Piriformis Stretch (Mirrored)  - 2 x daily - 7 x weekly - 1 sets - 3 reps - 30-60 sec hold - Seated Piriformis Stretch with Trunk Bend  - 2 x daily - 7 x weekly - 1 sets - 3 reps - 30-60 sec  hold - Seated Hamstring Stretch  - 2 x daily - 7 x weekly - 1 sets - 3 reps - 30-60 sec hold - Seated March over Obstacle  - 1 x daily - 3 x weekly - 2 sets - 10 reps - Prone Hip Extension  - 1 x daily - 7 x weekly - 2 sets - 10 reps - Supine Bridge  - 2 x daily - 7 x weekly - 1-3 sets - 10 reps - Abdominal Press into Brawley  - 1 x daily - 7 x weekly - 2 sets - 10 reps - Standing Lumbar Extension at Wall - Forearms  - 4-5 x daily - 7 x weekly - 1 sets - 10 reps  ASSESSMENT:  CLINICAL IMPRESSION: Pt continues to report relief of leg symptoms with prone  and prone on elbows position. Pt reports 15-20% overall reduction in Rt LE symptoms since the start of care.  She  also had relief in the back with lumbar ext at the wall.  Pt reports that she has not incorporated this into her day and PT encouraged pt to do standing extension and prone extension when she has LE symptoms. PT monitored throughout session for pain and response. Patient will benefit from skilled PT to address the below impairments and improve overall function.   OBJECTIVE IMPAIRMENTS: Abnormal gait, decreased activity tolerance, decreased knowledge of condition, decreased ROM, decreased strength, increased muscle spasms, impaired flexibility, postural dysfunction, and pain.   ACTIVITY LIMITATIONS: bending, standing, squatting, stairs, transfers, bed mobility, dressing, and locomotion level  PARTICIPATION LIMITATIONS: meal prep, cleaning, laundry, shopping, community activity, yard work, and church  PERSONAL FACTORS: Age, Fitness, Time since onset of injury/illness/exacerbation, and 1-2 comorbidities: OA anc chronic LBP are also affecting patient's functional outcome.   REHAB POTENTIAL: Good  CLINICAL DECISION MAKING: Evolving/moderate complexity  EVALUATION COMPLEXITY: Moderate   GOALS: Goals reviewed with patient? Yes  SHORT TERM GOALS: Target date: 01/29/2024   Patient will be independent with initial HEP.  Baseline:  Goal status: MET  2.  Patient will report centralization of radicular symptoms.  Baseline: centralization with extension based exercise (01/30/24) Goal status: In progress   3.  Decreased pain by 30% with standing and walking Baseline: 15-20% (01/30/24) Goal status: In progress    LONG TERM GOALS: Target date: 02/26/2024   Patient will be independent with advanced/ongoing HEP to improve outcomes and carryover.  Baseline:  Goal status: INITIAL  2.  Patient will report 75% improvement in pain with ADLs to improve QOL.  Baseline: 15-20%  (01/30/24) Goal status: INITIAL  3. Improved 5XSTS by 2-3 seconds showing functional improvement in strength.  Baseline: 25.94 seconds (01/23/24) Goal status: met  4.  Patient will demonstrate improved strength to 4+/5 to normalize body mechanics. Baseline:  Goal status: INITIAL  5.  Patient will score <= 19 on the Modified Oswestry demonstrating improved functional ability.  Baseline: 25 Goal status: INITIAL  6.  Patient able to walk with a normal gait pattern. Baseline: antalgic with reduced time in stance (01/23/24) Goal status: in progress   7.  Patient able to climb stairs with a reciprocal gait pattern.  Baseline: step to gait Goal status: INITIAL    PLAN:  PT FREQUENCY: 2x/week  PT DURATION: 8 weeks  PLANNED INTERVENTIONS: 97110-Therapeutic exercises, 97530- Therapeutic activity, 97112- Neuromuscular re-education, 97535- Self Care, [id number]- Manual therapy, [phone]- Gait training, [phone]- Aquatic Therapy, [phone]- Electrical stimulation (unattended), [id number]- Ultrasound, [id number]- Ionotophoresis 4mg /ml Dexamethasone , 79439 (1-2 muscles), [id number] (3+ muscles)- Dry Needling, Patient/Family education, Stair training, Taping, Joint mobilization, Spinal mobilization, Cryotherapy, and Moist heat.  PLAN FOR NEXT SESSION:assess response to prone lying and lumbar ext at wall, hip flexibility and strengthening, gait,  manual/Addaday/DN to R lumbar/gluteals/LE, spinal mobs  Burnard Joy, PT 01/30/2024 10:15 AM

## 2024-02-05 ENCOUNTER — Telehealth: Payer: Self-pay | Admitting: *Deleted

## 2024-02-05 ENCOUNTER — Ambulatory Visit

## 2024-02-05 DIAGNOSIS — M5416 Radiculopathy, lumbar region: Secondary | ICD-10-CM | POA: Diagnosis not present

## 2024-02-05 DIAGNOSIS — R2689 Other abnormalities of gait and mobility: Secondary | ICD-10-CM

## 2024-02-05 DIAGNOSIS — M5459 Other low back pain: Secondary | ICD-10-CM

## 2024-02-05 DIAGNOSIS — M79661 Pain in right lower leg: Secondary | ICD-10-CM

## 2024-02-05 DIAGNOSIS — M6281 Muscle weakness (generalized): Secondary | ICD-10-CM

## 2024-02-05 NOTE — Therapy (Signed)
 " OUTPATIENT PHYSICAL THERAPY THORACOLUMBAR TREATMENT   Patient Name: Colleen Coffey MRN: [id number] DOB:Jun 22, 1950, 73 y.o., female Today's Date: 02/05/2024  END OF SESSION:  PT End of Session - 02/05/24 0906     Visit Number 8    Date for Recertification  02/26/24    Authorization Type [employer] no auth required    PT Start Time 0847    PT Stop Time 0917    PT Time Calculation (min) 30 min    Activity Tolerance Patient tolerated treatment well    Behavior During Therapy [facility] for tasks assessed/performed                Past Medical History:  Diagnosis Date   Constipation    chronic, improved with healthy diet   Endocervical polyp    GERD (gastroesophageal reflux disease)    Hemorrhoids    resolved   Hyperlipidemia    Hypertension    Osteoarthritis, knee 06/27/2015   -saw [facility]    Osteopenia 06/27/2015   -reports on medication remotely with gyn    Past Surgical History:  Procedure Laterality Date   APPENDECTOMY     BACK SURGERY  2014   bulging disc lumbar spine   BREAST CYST ASPIRATION Right    CESAREAN SECTION     2   CHOLECYSTECTOMY N/A 11/16/2020   Procedure: LAPAROSCOPIC CHOLECYSTECTOMY;  Surgeon: Curvin Deward MOULD, MD;  Location: [facility] OR;  Service: General;  Laterality: N/A;   Patient Active Problem List   Diagnosis Date Noted   Trochanteric bursitis of both hips 07/05/2022   Postlaminectomy syndrome, lumbar region 07/05/2022   Sacroiliac dysfunction 07/05/2022   Muscle pain 10/19/2021   Prediabetes 10/19/2021   Hypertension 04/27/2020   Osteoarthritis, knee 06/27/2015   BMI 28.0-28.9,adult 06/27/2015   Constipation 06/27/2015   Osteopenia 06/27/2015    PCP: Ozell Heron HERO, MD   REFERRING PROVIDER: Carilyn Prentice BRAVO, MD   REFERRING DIAG:  M96.1 (ICD-10-CM) - Postlaminectomy syndrome, lumbar region  M22.2X1,M22.2X2 (ICD-10-CM) - Patellofemoral arthralgia of both knees    Rationale for Evaluation and Treatment:  Rehabilitation  THERAPY DIAG:  Radiculopathy, lumbar region  Pain in right lower leg  Muscle weakness (generalized)  Other abnormalities of gait and mobility  Other low back pain  ONSET DATE: 2 years ago  SUBJECTIVE:                                                                                                                                                                                           SUBJECTIVE STATEMENT: Not much change in pain.  Ready to D/C to HEP and I'll  go see the MD soon  Eval:  In the last couple of years my back started hurting again. Has injections which helps for a while, but then it moves to my hips, now in my knees. My knees have been bad since 2011. Also gets injections for hips in October. Years since knees have been injected. Back is on/off in back. It's only the R knee that's bothering me, not the left. Uses step to gait on stairs. Hard to put on pair of socks. Stooping very hard.  PERTINENT HISTORY:  Lumbar laminectomy 2014, OA knee, osteopenia  PAIN: 02/05/2024   Are you having pain? Yes: NPRS scale: 6/10 in R with walking Pain location: Rt  Pain description: burning  Aggravating factors: walking and bending Relieving factors: nothing, Tylenol   Are you having pain? Yes: NPRS scale: 0/10 up to 4/10 Pain location: Bil low back Pain description: achy Aggravating factors: standing, bending Relieving factors: sitting  PRECAUTIONS: None  RED FLAGS: None   WEIGHT BEARING RESTRICTIONS: No  FALLS:  Has patient fallen in last 6 months? No  LIVING ENVIRONMENT: Lives with: grandaughter (19) Lives in: House/apartment Stairs: Yes: Internal: 13 steps; can reach both and External: 4 steps; can reach both Has following equipment at home: None  OCCUPATION: retired  PLOF: Independent with basic ADLs and needs help carrying things on stairs  PATIENT GOALS: Get my right leg working  NEXT MD VISIT: 02/11/24  OBJECTIVE:  Note: Objective  measures were completed at Evaluation unless otherwise noted.  DIAGNOSTIC FINDINGS:  XR IMPRESSION: 1. No fracture or dislocation of the bilateral knees. 2. Severe arthrosis of the bilateral patellofemoral compartments with mild arthrosis of the femorotibial compartments. 3. Small bilateral knee joint effusions.  MRI 2024 Postsurgical changes from L5-S1 posterior spinal fusion. No evidence of hardware complications. There is mild junctional level degenerative change at L4-L5 where there is mild spinal canal and bilateral neural foraminal narrowing. There is also moderate bilateral facet degenerative change at this level with fluid in the facet joints. No evidence of high-grade spinal canal or neural foraminal stenosis.  PATIENT SURVEYS:  Modified Oswestry:  MODIFIED OSWESTRY DISABILITY SCALE  Date: 01/01/24 Score  Pain intensity 3 =  Pain medication provides me with moderate relief from pain.  2. Personal care (washing, dressing, etc.) 2 =  It is painful to take care of myself, and I am slow and careful.  3. Lifting 2 = Pain prevents me from lifting heavy weights off the floor,but I can manage if the weights are conveniently positioned (e.g. on a table)  4. Walking 3 =  Pain prevents me from walking more than  mile.  5. Sitting 1 =  I can only sit in my favorite chair as long as I like.  6. Standing 4 =  Pain prevents me from standing more than 10 minutes.  7. Sleeping 2 =  Even when I take pain medication, I sleep less than 6 hours  8. Social Life 2 = Pain prevents me from participating in more energetic activities (eg. sports, dancing).  9. Traveling 4 = My pain restricts my travel to short necessary journeys under 1/2 hour.  10. Employment/ Homemaking 2 = I can perform most of my homemaking/job duties, but pain prevents me from performing more physically stressful activities (eg, lifting, vacuuming).  Total 25/50  02/05/24: 21/50   Interpretation of scores: Score Category  Description  0-20% Minimal Disability The patient can cope with most living activities. Usually no treatment is indicated apart  from advice on lifting, sitting and exercise  21-40% Moderate Disability The patient experiences more pain and difficulty with sitting, lifting and standing. Travel and social life are more difficult and they may be disabled from work. Personal care, sexual activity and sleeping are not grossly affected, and the patient can usually be managed by conservative means  41-60% Severe Disability Pain remains the main problem in this group, but activities of daily living are affected. These patients require a detailed investigation  61-80% Crippled Back pain impinges on all aspects of the patients life. Positive intervention is required  81-100% Bed-bound These patients are either bed-bound or exaggerating their symptoms  Bluford FORBES Zoe DELENA Karon DELENA, et al. Surgery versus conservative management of stable thoracolumbar fracture: the PRESTO feasibility RCT. [place] ([place]): [employer]; 2021 Nov. [facility] Technology Assessment, No. 25.62.) Appendix 3, Oswestry Disability Index category descriptors. Available from: Findjewelers.cz  Minimally Clinically Important Difference (MCID) = 12.8%  COGNITION: Overall cognitive status: Within functional limits for tasks assessed     SENSATION: Light touch: Impaired  R decreased compared to Left  MUSCLE LENGTH: Tightness in  B HS, piriformis,  POSTURE: rounded shoulders and weight shift left  PALPATION: Palpation: TTP at R peroneals.Spinal Mobility: NT Patellar Mobility: WNL     LUMBAR ROM: Full but pain at end range extension and left rotation  LOWER EXTREMITY ROM:   WFL for tasks assessed  LOWER EXTREMITY MMT:  *pain;  LLE 5/5 except where noted  MMT Right eval Left eval  Hip flexion 4- 4-  Hip extension    Hip abduction    Hip adduction    Hip internal rotation 4   Hip  external rotation 4+   Knee flexion 3+* 5  Knee extension 4- 5  Ankle dorsiflexion 5 5  Ankle plantarflexion    Ankle inversion    Ankle eversion 4+    (Blank rows = not tested)  HIP TESTS: Could not tolerated hip scour test, SKTC or FABER on R  FUNCTIONAL TESTS:  5 times sit to stand: 32.90 sec must use hands on knees 01/23/24: 5x sit to stand 25.94 seconds with use of hands   GAIT: Antalgic with decreased stance time R  TREATMENT DATE:                                                                                                                                02/05/24:  Nustep L5 x 8 min- PT present to discuss progress  Prone lying abolishes her leg pain to 0/10  Prone on elbows x2 min Prone hip ext 2 x 10 B Bridge 2x10 Verbal review of all HEP   01/30/24:  Nustep L5 x 8 min- PT present to discuss progress  Prone lying abolishes her leg pain to 0/10  Prone on elbows x2 min Prone hip ext 2 x 10 B Bridge 2x10 Supine: TA activation with SLR 2x10 bil  Supine TA activation  with press into ball: opposite arm/leg 5 hold x10  Supine TA activation with hip adduction: ball squeezes 2x10 Seated hip up and overs (low cone on foam due to pt height) 2 x 10 Bil -holding 4# weight on top of thigh Wall ext x 5 - relief to back    01/28/24:  Nustep L5 x 7 min- PT present to discuss progress  Worked on sit to stand with straight back - scooting to edge of mat table - challenging due to knees Prone lying abolishes her leg pain from 5/10 to 0/10 Prone on elbows is good as well - discussed doing prone or prone on elbows every 2 hours for 5 min if at home. Prone hip ext 2 x 10 B Bridge 2x10 Supine TA activation with press into ball: opposite arm/leg 5 hold x10  Supine TA activation with hip adduction: ball squeezes 2x10 Seated hip up and overs (low cone on foam due to pt height) 2 x 10 Bil -holding 4# weight on top of thigh Wall ext x 5 - relief to back   PATIENT EDUCATION:   Education details: PT eval findings, anticipated POC, initial HEP, and role of TPDN   Person educated: Patient Education method: Explanation, Demonstration, Tactile cues, Verbal cues, and Handouts Education comprehension: verbalized understanding and returned demonstration  HOME EXERCISE PROGRAM: Access Code: [email or web address] URL: [email or web address] Date: 01/28/2024 Prepared by: Mliss  Exercises - Supine Bridge  - 2 x daily - 7 x weekly - 1-3 sets - 10 reps - Seated Hip Internal Rotation AROM  - 2 x daily - 7 x weekly - 2 sets - 10 reps - Seated Long Arc Quad  - 2 x daily - 7 x weekly - 2 sets - 10 reps - 5 sec hold - Seated Table Piriformis Stretch (Mirrored)  - 2 x daily - 7 x weekly - 1 sets - 3 reps - 30-60 sec hold - Seated Piriformis Stretch with Trunk Bend  - 2 x daily - 7 x weekly - 1 sets - 3 reps - 30-60 sec  hold - Seated Hamstring Stretch  - 2 x daily - 7 x weekly - 1 sets - 3 reps - 30-60 sec hold - Seated March over Obstacle  - 1 x daily - 3 x weekly - 2 sets - 10 reps - Prone Hip Extension  - 1 x daily - 7 x weekly - 2 sets - 10 reps - Supine Bridge  - 2 x daily - 7 x weekly - 1-3 sets - 10 reps - Abdominal Press into [place]  - 1 x daily - 7 x weekly - 2 sets - 10 reps - Standing Lumbar Extension at Wall - Forearms  - 4-5 x daily - 7 x weekly - 1 sets - 10 reps  ASSESSMENT:  CLINICAL IMPRESSION: Pt continues to report relief of leg symptoms with prone and prone on elbows position. Pt reports 15-20% overall reduction in Rt LE symptoms since the start of care.  This pain is mainly coming from her knee on the Rt.  Radiculopathy seems to have resolved.  PT encouraged pt to remain consistent with her HEP and she will follow-up with the MD to discuss options for her knee pain. Pt will D/C to HEP today.    OBJECTIVE IMPAIRMENTS: Abnormal gait, decreased activity tolerance, decreased knowledge of condition, decreased ROM, decreased strength, increased muscle spasms,  impaired flexibility, postural dysfunction, and pain.   ACTIVITY LIMITATIONS: bending, standing, squatting, stairs, transfers, bed mobility,  dressing, and locomotion level  PARTICIPATION LIMITATIONS: meal prep, cleaning, laundry, shopping, community activity, yard work, and church  PERSONAL FACTORS: Age, Fitness, Time since onset of injury/illness/exacerbation, and 1-2 comorbidities: OA anc chronic LBP are also affecting patient's functional outcome.   REHAB POTENTIAL: Good  CLINICAL DECISION MAKING: Evolving/moderate complexity  EVALUATION COMPLEXITY: Moderate   GOALS: Goals reviewed with patient? Yes  SHORT TERM GOALS: Target date: 01/29/2024   Patient will be independent with initial HEP.  Baseline:  Goal status: MET  2.  Patient will report centralization of radicular symptoms.  Baseline: centralization with extension based exercise (01/30/24) Goal status: In progress   3.  Decreased pain by 30% with standing and walking Baseline: 15-20% (01/30/24) Goal status: In progress    LONG TERM GOALS: Target date: 02/26/2024   Patient will be independent with advanced/ongoing HEP to improve outcomes and carryover.  Baseline:  Goal status: MET  2.  Patient will report 75% improvement in pain with ADLs to improve QOL.  Baseline: 15-20% (01/30/24) Goal status: partially met  3. Improved 5XSTS by 2-3 seconds showing functional improvement in strength.  Baseline: 25.94 seconds (01/23/24) Goal status: met  4.  Patient will demonstrate improved strength to 4+/5 to normalize body mechanics. Baseline:  Goal status: not met   5.  Patient will score <= 19 on the Modified Oswestry demonstrating improved functional ability.  Baseline: 21/50=42% disability  Goal status: partially met   6.  Patient able to walk with a normal gait pattern. Baseline: antalgic with reduced time in stance (02/05/24) Goal status: not met   7.  Patient able to climb stairs with a reciprocal gait  pattern.  Baseline: step to gait Goal status: not met     PLAN:  PHYSICAL THERAPY DISCHARGE SUMMARY  Visits from Start of Care: 8  Current functional level related to goals / functional outcomes: Pt reports 10-20% overall reduction in pain since the start of care.  She continues to have significant Rt knee pain with standing and walking.     Remaining deficits: Antalgic gait, step-to gait with negotiating steps and standing long periods aggravates knee.    Education / Equipment: HEP, estate manager/land agent education    Patient agrees to discharge. Patient goals were not met. Patient is being discharged due to lack of progress.   Burnard Joy, PT 02/05/2024 9:19 AM   "

## 2024-02-05 NOTE — Telephone Encounter (Signed)
 Colleen Coffey called and she has an appointment with Dr Carilyn on 02/11/24 and is requesting she get an knee injection at that appointment. Will that be possible?

## 2024-02-11 ENCOUNTER — Encounter: Payer: Self-pay | Admitting: Physical Medicine & Rehabilitation

## 2024-02-11 ENCOUNTER — Ambulatory Visit

## 2024-02-11 ENCOUNTER — Encounter: Attending: Physical Medicine & Rehabilitation | Admitting: Physical Medicine & Rehabilitation

## 2024-02-11 VITALS — BP 159/86 | HR 88 | Ht 60.0 in | Wt 191.6 lb

## 2024-02-11 DIAGNOSIS — M1711 Unilateral primary osteoarthritis, right knee: Secondary | ICD-10-CM | POA: Insufficient documentation

## 2024-02-11 NOTE — Patient Instructions (Signed)
 Knee Injection A knee injection is a procedure to get medicine in your knee joint to help relieve the pain, swelling, and stiffness that is caused by arthritis. The medicine may also cushion your knee joint. Your health care provider will use a needle to inject the medicine. You may need more than one injection. Tell a health care provider about: Any allergies you have. All medicines you take. These include vitamins, herbs, eye drops, and creams. Any problems you or family members have had with anesthesia. Any bleeding problems you have. Any surgeries you've had. Any medical conditions you have. Whether you're pregnant or may be pregnant. What are the risks? Your provider will talk with you about risks. These may include: Infection. Bleeding. Symptoms that get worse. Damage to the area around your knee. Allergic reaction to the medicines. Skin reactions from having many injections. What happens before the procedure? Ask about changing or stopping: Any medicines you take. Any vitamins, herbs, or supplements you take. Do not take aspirin or ibuprofen unless you're told to. Plan to have a responsible adult drive you home from the hospital or clinic. You won't be allowed to drive. What happens during the procedure?  You will sit or lie down in a position for your knee to be treated. The skin over your kneecap will be cleaned with a soap that kills germs. You will be given a medicine that numbs the area. You may feel some stinging. The medicine will be injected into your knee. The needle is placed between your kneecap and your knee. The medicine is injected into the joint space. The needle will be taken out. A bandage may be placed over the injection site. The procedure may vary among providers and hospitals. What can I expect after the procedure? Your blood pressure, heart rate, breathing rate, and blood oxygen level will be monitored until you leave the hospital or clinic. You may  have to move your knee through its full range of motion. This helps to get the medicine into the joint space. You will be watched to make sure that you do not have a reaction to the injection. You may feel more pain, swelling, and warmth than you did before the injection. This reaction may last about 1-2 days. Follow these instructions at home: Medicines Take over-the-counter and prescription medicines only as told by your provider. Ask your provider if the medicine prescribed to you requires you to avoid driving or using machinery. Do not take medicines such as aspirin and ibuprofen unless your provider tells you to. Injection site care Follow instructions from your provider about: How to take care of your injection site. When and how you should change your bandage. When you should take off your bandage. Check your injection area every day for signs of infection. Check for: More redness, swelling, or pain after 2 days. Fluid or blood. Warmth. Pus or a bad smell. Managing pain, stiffness, and swelling  Use ice or an ice pack as told. Place a towel between your skin and the ice. Leave the ice on for 20 minutes, 2-3 times a day. If your skin turns red, take off the ice right away to prevent skin damage. The risk of damage is higher if you can't feel pain, heat, or cold. Do not put heat to your knee. Raise your knee above the level of your heart while you're sitting or lying down. General instructions If you have a bandage, keep it dry until your provider says it can be taken  off. Ask your provider when you can start taking showers and baths. Avoid activities that take a lot of effort for as long as told by your provider. Ask what things are safe for you to do at home. Ask when you can go back to work or school. Keep all follow-up visits. You may need more injections. Contact a health care provider if: You have a fever. You have any signs of infection. Your symptoms at the injection  site last longer than 2 days after your procedure. Get help right away if: Your knee turns very red. Your knee becomes very swollen. You have very bad knee pain. This information is not intended to replace advice given to you by your health care provider. Make sure you discuss any questions you have with your health care provider. Document Revised: 05/29/2022 Document Reviewed: 05/29/2022 Elsevier Patient Education  2024 [employer].

## 2024-02-11 NOTE — Progress Notes (Signed)
 "  Subjective:    Patient ID: Colleen Coffey, female    DOB: 1950/04/24, 73 y.o.   MRN: [id number]  HPI Discussed the use of AI scribe software for clinical note transcription with the patient, who gave verbal consent to proceed.  History of Present Illness Colleen Coffey is a 73 year old female with right knee osteoarthritis with effusion and lumbar radiculopathy who presents for follow-up of persistent right knee and leg pain.  She continues to experience significant right knee pain and swelling, with difficulty ambulating and inability to ascend stairs. She is unable to bend the right knee due to a sensation of swelling and pain that intensifies beyond a certain point. The pain is primarily localized to the patella and radiates down the leg, described as sharp and burning.  She has completed eight sessions of physical therapy since her last visit, without improvement in symptoms. She remains unable to walk up steps or crawl up them due to pain and limited mobility. She attempted to contact the office on Christmas Eve due to worsening symptoms, but was unable to reach anyone as the office was closed.  In addition to her knee symptoms, she experiences sharp pain radiating from the lumbar spine down the lateral aspect of the leg. These symptoms persist despite prior interventions and physical therapy.    Pain Inventory Average Pain 9 Pain Right Now 8 My pain is sharp, burning, stabbing, tingling, and aching  In the last 24 hours, has pain interfered with the following? General activity 10 Relation with others 0 Enjoyment of life 10 What TIME of day is your pain at its worst? morning , daytime, evening, night, and varies Sleep (in general) Fair  Pain is worse with: walking, bending, inactivity, standing, and some activites Pain improves with: rest Relief from Meds: 4  Family History  Problem Relation Age of Onset   High blood pressure Mother    High Cholesterol Mother     Dementia Mother    Arthritis Mother    Heart disease Mother    Syncope episode Mother    High blood pressure Father    COPD Father    High blood pressure Sister    Parkinson's disease Sister    Arthritis Sister    High blood pressure Maternal Grandmother    High Cholesterol Maternal Grandmother    Dementia Maternal Grandmother    Arthritis Maternal Grandmother    COPD Brother    High blood pressure Brother    Hyperlipidemia Other    Hypertension Other    Depression Other    Esophageal cancer Neg Hx    Colon cancer Neg Hx    Pancreatic cancer Neg Hx    Stomach cancer Neg Hx    Liver disease Neg Hx    Breast cancer Neg Hx    Social History   Socioeconomic History   Marital status: Widowed    Spouse name: Not on file   Number of children: 2   Years of education: Not on file   Highest education level: Some college, no degree  Occupational History   Occupation: Retired  Tobacco Use   Smoking status: Never   Smokeless tobacco: Never  Vaping Use   Vaping status: Never Used  Substance and Sexual Activity   Alcohol use: No   Drug use: No   Sexual activity: Not Currently    Birth control/protection: Post-menopausal  Other Topics Concern   Not on file  Social History Narrative   Work  or School: retired, used to be a merchandiser, retail for [employer]      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; as a software engineer; husband and mother passed in 2013      Spiritual Beliefs: Christian      Lifestyle: exercising; eating healthy      Social Drivers of Health   Tobacco Use: Low Risk (02/11/2024)   Patient History    Smoking Tobacco Use: Never    Smokeless Tobacco Use: Never    Passive Exposure: Not on file  Financial Resource Strain: Low Risk (12/15/2023)   Overall Financial Resource Strain (CARDIA)    Difficulty of Paying Living Expenses: Not very hard  Food Insecurity: No Food Insecurity (12/16/2023)   Epic    Worried About Programme Researcher, Broadcasting/film/video in the Last Year: Never true    Ran Out of  Food in the Last Year: Never true  Transportation Needs: No Transportation Needs (12/16/2023)   Epic    Lack of Transportation (Medical): No    Lack of Transportation (Non-Medical): No  Physical Activity: Insufficiently Active (12/16/2023)   Exercise Vital Sign    Days of Exercise per Week: 2 days    Minutes of Exercise per Session: 30 min  Stress: No Stress Concern Present (12/16/2023)   [employer] of Occupational Health - Occupational Stress Questionnaire    Feeling of Stress: Only a little  Social Connections: Moderately Integrated (12/16/2023)   Social Connection and Isolation Panel    Frequency of Communication with Friends and Family: More than three times a week    Frequency of Social Gatherings with Friends and Family: Twice a week    Attends Religious Services: More than 4 times per year    Active Member of [employer] or Organizations: Yes    Attends Banker Meetings: More than 4 times per year    Marital Status: Widowed  Depression (PHQ2-9): Low Risk (02/11/2024)   Depression (PHQ2-9)    PHQ-2 Score: 2  Alcohol Screen: Low Risk (04/22/2023)   Alcohol Screen    Last Alcohol Screening Score (AUDIT): 0  Housing: Low Risk (12/16/2023)   Epic    Unable to Pay for Housing in the Last Year: No    Number of Times Moved in the Last Year: 0    Homeless in the Last Year: No  Utilities: Not At Risk (12/16/2023)   Epic    Threatened with loss of utilities: No  Health Literacy: Adequate Health Literacy (12/16/2023)   B1300 Health Literacy    Frequency of need for help with medical instructions: Never   Past Surgical History:  Procedure Laterality Date   APPENDECTOMY     BACK SURGERY  2014   bulging disc lumbar spine   BREAST CYST ASPIRATION Right    CESAREAN SECTION     2   CHOLECYSTECTOMY N/A 11/16/2020   Procedure: LAPAROSCOPIC CHOLECYSTECTOMY;  Surgeon: Curvin Deward MOULD, MD;  Location: [facility] OR;  Service: General;  Laterality: N/A;   Past Surgical History:  Procedure  Laterality Date   APPENDECTOMY     BACK SURGERY  2014   bulging disc lumbar spine   BREAST CYST ASPIRATION Right    CESAREAN SECTION     2   CHOLECYSTECTOMY N/A 11/16/2020   Procedure: LAPAROSCOPIC CHOLECYSTECTOMY;  Surgeon: Curvin Deward MOULD, MD;  Location: [facility] OR;  Service: General;  Laterality: N/A;   Past Medical History:  Diagnosis Date   Constipation    chronic, improved with healthy diet  Endocervical polyp    GERD (gastroesophageal reflux disease)    Hemorrhoids    resolved   Hyperlipidemia    Hypertension    Osteoarthritis, knee 06/27/2015   -saw [facility]    Osteopenia 06/27/2015   -reports on medication remotely with gyn    BP (!) 159/86   Pulse 88   Ht 5' (1.524 m)   Wt 191 lb 9.6 oz (86.9 kg)   LMP 01/11/2017   SpO2 97%   BMI 37.42 kg/m   Opioid Risk Score:   Fall Risk Score:  `1  Depression screen PHQ 2/9     02/11/2024    1:19 PM 12/31/2023   10:26 AM 12/16/2023   11:57 AM 08/09/2023    9:53 AM 06/11/2023   10:10 AM 04/22/2023    8:43 AM 12/10/2022    4:02 PM  Depression screen PHQ 2/9  Decreased Interest 1 1 0 0 0 0 0  Down, Depressed, Hopeless 1 1 0 0 0 0 0  PHQ - 2 Score 2 2 0 0 0 0 0  Altered sleeping   0   0 0  Tired, decreased energy   0   0 0  Change in appetite   0   0 0  Feeling bad or failure about yourself    0   0 0  Trouble concentrating   0   0 0  Moving slowly or fidgety/restless   0   0 0  Suicidal thoughts   0   0 0  PHQ-9 Score   0    0  0   Difficult doing work/chores   Not difficult at all    Not difficult at all     Data saved with a previous flowsheet row definition     Review of Systems  Musculoskeletal:  Positive for back pain and gait problem.       Right knee  All other systems reviewed and are negative.      Objective:   Physical Exam Vitals reviewed.  Constitutional:      Appearance: She is obese.  HENT:     Head: Normocephalic and atraumatic.  Neurological:     Mental Status: She is alert and  oriented to person, place, and time.     Cranial Nerves: No dysarthria or facial asymmetry.     Sensory: No sensory deficit.     Motor: Motor function is intact. No abnormal muscle tone.     Coordination: Coordination normal.     Comments: Antalgic gait due to pain with weightbearing on the right knee.  Psychiatric:        Mood and Affect: Mood normal.        Behavior: Behavior normal.   There is no evidence of effusion at the right knee.  There is full extension.  Flexion is to 90 limited by pain.  She has pain around the patella on the right side no pain along the medial or lateral joint line is also some tenderness around the hamstring tendon medial greater than lateral aspect.  No fullness in the popliteal fossa.          Assessment & Plan:  Assessment and Plan Assessment & Plan Patellofemoral arthritis and effusion, right knee Chronic patellofemoral arthritis with effusion causing pain, swelling, and limited flexion, impairing ambulation and stair climbing. - Discussed Synvisc injection as a treatment option. - Planned to determine eligibility for Synvisc injection during today's visit.  Right-sided lumbar radiculopathy with  postlaminectomy syndrome Persistent sharp, burning pain radiating from the lumbar region.  Consider epidural injection if this persists   Knee injection Without  ultrasound guidance)  Indication:RIGHT Knee pain not relieved by medication management and other conservative care.  Informed consent was obtained after describing risks and benefits of the procedure with the patient, this includes bleeding, bruising, infection and medication side effects. The patient wishes to proceed and has given written consent. The patient was placed in a recumbent position. The medial aspect of the knee was marked and prepped with Betadine and alcohol. It was then entered with a 25-gauge 1-1/2 inch needle inserted into the knee joint. After negative draw back for blood, a  solution containing one ML of 6mg  per mL betamethasone  and 4 mL of 1% lidocaine  were injected. The patient tolerated the procedure well. Post procedure instructions were given.  "

## 2024-02-14 ENCOUNTER — Ambulatory Visit: Admitting: Physical Therapy

## 2024-02-17 ENCOUNTER — Encounter: Payer: Self-pay | Admitting: Physical Medicine & Rehabilitation

## 2024-02-18 ENCOUNTER — Ambulatory Visit

## 2024-02-20 ENCOUNTER — Encounter: Payer: Self-pay | Admitting: Physical Medicine & Rehabilitation

## 2024-02-21 ENCOUNTER — Ambulatory Visit: Admitting: Physical Therapy

## 2024-02-21 MED ORDER — PRAVASTATIN SODIUM 20 MG PO TABS: 20.0000 mg | ORAL_TABLET | Freq: Every day | ORAL | 1 refills | Status: AC

## 2024-02-22 ENCOUNTER — Other Ambulatory Visit: Payer: Self-pay | Admitting: Physical Medicine & Rehabilitation

## 2024-02-24 MED ORDER — MELOXICAM 7.5 MG PO TABS: 7.5000 mg | ORAL_TABLET | Freq: Every day | ORAL | 0 refills | Status: AC

## 2024-02-24 NOTE — Telephone Encounter (Signed)
 Requested Prescriptions   Pending Prescriptions Disp Refills   gabapentin  (NEURONTIN ) 100 MG capsule [Pharmacy Med Name: GABAPENTIN  100 MG CAPSULE] 60 capsule 1    Sig: TAKE 1 CAPSULE BY MOUTH TWICE A DAY     Date of patient request: 02/24/2024 Last office visit: 02/11/2024 Upcoming visit: 03/24/2024 Date of last refill: 12/31/2023 Last refill amount: #60 1 refill

## 2024-02-25 ENCOUNTER — Ambulatory Visit

## 2024-02-25 MED ORDER — TRAMADOL HCL 50 MG PO TABS: 50.0000 mg | ORAL_TABLET | Freq: Two times a day (BID) | ORAL | 0 refills | Status: DC

## 2024-02-27 ENCOUNTER — Other Ambulatory Visit: Payer: Self-pay | Admitting: Family Medicine

## 2024-02-27 DIAGNOSIS — G4709 Other insomnia: Secondary | ICD-10-CM

## 2024-03-04 ENCOUNTER — Telehealth: Payer: Self-pay | Admitting: *Deleted

## 2024-03-04 NOTE — Telephone Encounter (Signed)
 PCP sent a message stating she received paperwork for a surgical clearance and stated the patient needs an appt first.  Appt scheduled on 1/22.

## 2024-03-05 ENCOUNTER — Ambulatory Visit (INDEPENDENT_AMBULATORY_CARE_PROVIDER_SITE_OTHER): Admitting: Family Medicine

## 2024-03-05 ENCOUNTER — Encounter: Payer: Self-pay | Admitting: Family Medicine

## 2024-03-05 DIAGNOSIS — M1711 Unilateral primary osteoarthritis, right knee: Secondary | ICD-10-CM

## 2024-03-05 DIAGNOSIS — R7303 Prediabetes: Secondary | ICD-10-CM | POA: Diagnosis not present

## 2024-03-05 DIAGNOSIS — I10 Essential (primary) hypertension: Secondary | ICD-10-CM

## 2024-03-05 DIAGNOSIS — Z01818 Encounter for other preprocedural examination: Secondary | ICD-10-CM

## 2024-03-05 MED ORDER — LOSARTAN POTASSIUM-HCTZ 50-12.5 MG PO TABS: 1.0000 | ORAL_TABLET | Freq: Every day | ORAL | 3 refills | Status: AC

## 2024-03-05 NOTE — Progress Notes (Signed)
 "  Established Patient Office Visit  Subjective   Patient ID: Colleen Coffey, female    DOB: 11-Dec-1950  Age: 74 y.o. MRN: [id number]  Chief Complaint  Patient presents with   Pre-op Exam    HPI  Discussed the use of AI scribe software for clinical note transcription with the patient, who gave verbal consent to proceed.  History of Present Illness   Colleen Coffey is a 74 year old female with hypertension who presents for a preoperative evaluation and blood pressure management.  She is preparing for possible knee replacement surgery and will have an MRI to determine whether she needs a partial or total replacement, with no date scheduled yet.  Her blood pressure was elevated at her last visit and is 154/72 mmHg today. She has not been checking home readings but takes amlodipine  10 mg and hydrochlorothiazide  12.5 mg daily. She has no chest pain, dizziness, or headaches. Her A1c was slightly elevated in September. She takes a cholesterol medication and trazodone  for sleep, both without issues.  She can walk up a flight of stairs without chest pain or shortness of breath when her knee is not painful. She has no history of heart attacks or coronary blockages. Kidney function was normal on labs three months ago.      Current Outpatient Medications  Medication Instructions   acetaminophen  (TYLENOL ) 650 mg, Oral, Every 6 hours PRN   amLODipine  (NORVASC ) 10 mg, Oral, Daily   Cholecalciferol (VITAMIN D3 PO) 1,000 Units, Daily   losartan -hydrochlorothiazide  (HYZAAR) 50-12.5 MG tablet 1 tablet, Oral, Daily   meloxicam  (MOBIC ) 7.5 mg, Oral, Daily   Multiple Vitamins-Minerals (ZINC PO) 50 mg, Daily   polyethylene glycol (MIRALAX ) 17 g, Daily   pravastatin  (PRAVACHOL ) 20 mg, Oral, Daily   traZODone  (DESYREL ) 50 MG tablet TAKE 1/2 TO 1 TABLET AT BEDTIME AS NEEDED SLEEP    Patient Active Problem List   Diagnosis Date Noted   Obesity, morbid (HCC) 03/05/2024   Trochanteric bursitis  of both hips 07/05/2022   Postlaminectomy syndrome, lumbar region 07/05/2022   Sacroiliac dysfunction 07/05/2022   Muscle pain 10/19/2021   Prediabetes 10/19/2021   Hypertension 04/27/2020   Osteoarthritis, knee 06/27/2015   BMI 28.0-28.9,adult 06/27/2015   Constipation 06/27/2015   Osteopenia 06/27/2015     Review of Systems  All other systems reviewed and are negative.     Objective:     BP (!) 152/70   Pulse 85   Temp 98.7 F (37.1 C) (Oral)   Ht 5' (1.524 m)   Wt 191 lb 1.6 oz (86.7 kg)   LMP 01/11/2017   SpO2 98%   BMI 37.32 kg/m    Physical Exam Vitals reviewed.  Constitutional:      Appearance: Normal appearance. She is well-groomed. She is obese.  Cardiovascular:     Rate and Rhythm: Normal rate and regular rhythm.     Heart sounds: S1 normal and S2 normal.  Pulmonary:     Effort: Pulmonary effort is normal.     Breath sounds: Normal breath sounds and air entry.  Musculoskeletal:     Right lower leg: No edema.     Left lower leg: No edema.  Neurological:     Mental Status: She is alert and oriented to person, place, and time. Mental status is at baseline.     Gait: Gait is intact.  Psychiatric:        Mood and Affect: Mood and affect normal.  Speech: Speech normal.        Behavior: Behavior normal.        Judgment: Judgment normal.      No results found for any visits on 03/05/24.    The 10-year ASCVD risk score (Arnett DK, et al., 2019) is: 20.4%    Assessment & Plan:  Obesity, morbid (HCC) Assessment & Plan: BMI 37 with significant co morbid condition, has trouble engaging in exercise due to her knee OA. She will be scheduled for either partial or total knee arthroplasty.    Primary hypertension -     Losartan  Potassium-HCTZ; Take 1 tablet by mouth daily.  Dispense: 30 tablet; Refill: 3 -     Basic metabolic panel with GFR; Future  Preoperative clearance According to ACC/ AHA guidelines, the patient is considered low risk for  orthopedic surgery, additional cardiac testing is not indicated at this time. Will order pre-op labs.  -     CBC with Differential/Platelet; Future -     Hemoglobin A1c; Future -     Basic metabolic panel with GFR; Future -     Protime-INR; Future  Prediabetes -     Hemoglobin A1c; Future  Primary osteoarthritis of right knee To be scheduled for knee replacement with Dr. Beverley. Getting pre-op labs and controlling BP prior to the surgery.   Assessment and Plan    Preoperative evaluation for knee replacement surgery Preoperative evaluation required for knee replacement surgery. MRI needed prior to surgery. No cardiac risk factors identified. Previous labs from September showed normal kidney function. A1c was slightly elevated. - Ordered MRI prior to surgery - Will repeat blood work including A1c, blood count, kidney function, and bleeding profile within 30 days of surgery  Primary hypertension Hypertension remains uncontrolled with current regimen of amlodipine  10 mg and hydrochlorothiazide  12.5 mg. Blood pressure readings consistently high, including 154/72 today. No symptoms of chest pain, dizziness, or headaches reported. Goal is to achieve blood pressure around 120/70 mmHg. Discussed adding losartan  to improve control and renal protection. Losartan  and hydrochlorothiazide  combination pill chosen to simplify regimen and enhance efficacy. - Initiated losartan  HCT (hydrochlorothiazide  and losartan  combination pill) - Monitor blood pressure at home, aiming for systolic <140 mmHg, ideally around 120 mmHg - Educated on symptoms of hypotension and when to seek medical attention        No follow-ups on file.    Heron CHRISTELLA Sharper, MD "

## 2024-03-05 NOTE — Patient Instructions (Addendum)
 Your goal blood pressure should be around 120/70, anything less than 140/90 is acceptable.  STOP hydrochlorothiazide .  START losartan  / HCT that I have given you today.

## 2024-03-09 ENCOUNTER — Ambulatory Visit

## 2024-03-09 NOTE — Assessment & Plan Note (Signed)
 BMI 37 with significant co morbid condition, has trouble engaging in exercise due to her knee OA. She will be scheduled for either partial or total knee arthroplasty.

## 2024-03-10 ENCOUNTER — Encounter: Payer: Self-pay | Admitting: Family Medicine

## 2024-03-18 ENCOUNTER — Other Ambulatory Visit

## 2024-03-18 ENCOUNTER — Encounter: Payer: Self-pay | Admitting: Family Medicine

## 2024-03-18 DIAGNOSIS — R7303 Prediabetes: Secondary | ICD-10-CM

## 2024-03-18 DIAGNOSIS — Z01818 Encounter for other preprocedural examination: Secondary | ICD-10-CM

## 2024-03-18 DIAGNOSIS — I10 Essential (primary) hypertension: Secondary | ICD-10-CM | POA: Diagnosis not present

## 2024-03-18 LAB — BASIC METABOLIC PANEL WITH GFR
BUN: 14 mg/dL (ref 6–23)
CO2: 30 meq/L (ref 19–32)
Calcium: 10 mg/dL (ref 8.4–10.5)
Chloride: 101 meq/L (ref 96–112)
Creatinine, Ser: 0.66 mg/dL (ref 0.40–1.20)
GFR: 86.27 mL/min
Glucose, Bld: 92 mg/dL (ref 70–99)
Potassium: 3.8 meq/L (ref 3.5–5.1)
Sodium: 139 meq/L (ref 135–145)

## 2024-03-18 LAB — CBC WITH DIFFERENTIAL/PLATELET
Basophils Absolute: 0 10*3/uL (ref 0.0–0.1)
Basophils Relative: 0.6 % (ref 0.0–3.0)
Eosinophils Absolute: 0.1 10*3/uL (ref 0.0–0.7)
Eosinophils Relative: 0.8 % (ref 0.0–5.0)
HCT: 42.2 % (ref 36.0–46.0)
Hemoglobin: 13.9 g/dL (ref 12.0–15.0)
Lymphocytes Relative: 27.1 % (ref 12.0–46.0)
Lymphs Abs: 2.2 10*3/uL (ref 0.7–4.0)
MCHC: 32.9 g/dL (ref 30.0–36.0)
MCV: 88.8 fl (ref 78.0–100.0)
Monocytes Absolute: 0.7 10*3/uL (ref 0.1–1.0)
Monocytes Relative: 8.8 % (ref 3.0–12.0)
Neutro Abs: 5 10*3/uL (ref 1.4–7.7)
Neutrophils Relative %: 62.7 % (ref 43.0–77.0)
Platelets: 361 10*3/uL (ref 150.0–400.0)
RBC: 4.75 Mil/uL (ref 3.87–5.11)
RDW: 14.1 % (ref 11.5–15.5)
WBC: 8 10*3/uL (ref 4.0–10.5)

## 2024-03-18 LAB — HEMOGLOBIN A1C: Hgb A1c MFr Bld: 5.8 % (ref 4.6–6.5)

## 2024-03-18 NOTE — Addendum Note (Signed)
 Addended by: Arantxa Piercey M on: 03/18/2024 08:51 AM   Modules accepted: Orders

## 2024-03-19 ENCOUNTER — Ambulatory Visit: Payer: Self-pay | Admitting: Family Medicine

## 2024-03-19 LAB — PROTIME-INR
INR: 1
Prothrombin Time: 10.7 s (ref 9.0–11.5)

## 2024-03-24 ENCOUNTER — Encounter: Admitting: Physical Medicine & Rehabilitation

## 2024-03-26 ENCOUNTER — Ambulatory Visit

## 2024-04-21 ENCOUNTER — Ambulatory Visit: Admitting: Family Medicine
